# Patient Record
Sex: Male | Born: 1940 | Race: White | Hispanic: No | Marital: Married | Smoking: Former smoker
Health system: Southern US, Community
[De-identification: ages and names within clinical notes are randomized; demographics above are authoritative.]

## PROBLEM LIST (undated history)

## (undated) DIAGNOSIS — E785 Hyperlipidemia, unspecified: Secondary | ICD-10-CM

## (undated) DIAGNOSIS — I4821 Permanent atrial fibrillation: Secondary | ICD-10-CM

## (undated) DIAGNOSIS — I10 Essential (primary) hypertension: Secondary | ICD-10-CM

## (undated) DIAGNOSIS — G250 Essential tremor: Secondary | ICD-10-CM

## (undated) DIAGNOSIS — E119 Type 2 diabetes mellitus without complications: Secondary | ICD-10-CM

## (undated) DIAGNOSIS — L97519 Non-pressure chronic ulcer of other part of right foot with unspecified severity: Secondary | ICD-10-CM

## (undated) DIAGNOSIS — N183 Chronic kidney disease, stage 3 unspecified: Secondary | ICD-10-CM

## (undated) DIAGNOSIS — M109 Gout, unspecified: Secondary | ICD-10-CM

## (undated) DIAGNOSIS — I2781 Cor pulmonale (chronic): Secondary | ICD-10-CM

## (undated) DIAGNOSIS — R269 Unspecified abnormalities of gait and mobility: Secondary | ICD-10-CM

## (undated) DIAGNOSIS — H269 Unspecified cataract: Secondary | ICD-10-CM

## (undated) DIAGNOSIS — R188 Other ascites: Secondary | ICD-10-CM

## (undated) DIAGNOSIS — K7460 Unspecified cirrhosis of liver: Secondary | ICD-10-CM

## (undated) HISTORY — DX: Non-pressure chronic ulcer of other part of right foot with unspecified severity: L97.519

## (undated) HISTORY — DX: Unspecified abnormalities of gait and mobility: R26.9

## (undated) HISTORY — DX: Unspecified cataract: H26.9

## (undated) HISTORY — DX: Hyperlipidemia, unspecified: E78.5

---

## 2002-04-08 ENCOUNTER — Encounter: Payer: Self-pay | Admitting: Urology

## 2002-04-08 ENCOUNTER — Ambulatory Visit (HOSPITAL_COMMUNITY): Admission: RE | Admit: 2002-04-08 | Discharge: 2002-04-08 | Payer: Self-pay | Admitting: Urology

## 2002-12-22 ENCOUNTER — Encounter: Payer: Self-pay | Admitting: Family Medicine

## 2002-12-22 ENCOUNTER — Encounter: Admission: RE | Admit: 2002-12-22 | Discharge: 2002-12-22 | Payer: Self-pay | Admitting: Family Medicine

## 2007-08-11 ENCOUNTER — Ambulatory Visit: Payer: Self-pay | Admitting: Internal Medicine

## 2007-08-24 LAB — CBC WITH DIFFERENTIAL/PLATELET
BASO%: 0.3 % (ref 0.0–2.0)
Basophils Absolute: 0 10*3/uL (ref 0.0–0.1)
EOS%: 0.8 % (ref 0.0–7.0)
Eosinophils Absolute: 0.1 10*3/uL (ref 0.0–0.5)
HCT: 39.6 % (ref 38.7–49.9)
HGB: 13.8 g/dL (ref 13.0–17.1)
LYMPH%: 13.1 % — ABNORMAL LOW (ref 14.0–48.0)
MCH: 33.7 pg — ABNORMAL HIGH (ref 28.0–33.4)
MCHC: 34.8 g/dL (ref 32.0–35.9)
MCV: 96.7 fL (ref 81.6–98.0)
MONO#: 0.9 10*3/uL (ref 0.1–0.9)
MONO%: 6.2 % (ref 0.0–13.0)
NEUT#: 11.8 10*3/uL — ABNORMAL HIGH (ref 1.5–6.5)
NEUT%: 79.6 % — ABNORMAL HIGH (ref 40.0–75.0)
Platelets: 305 10*3/uL (ref 145–400)
RBC: 4.1 10*6/uL — ABNORMAL LOW (ref 4.20–5.71)
RDW: 16.4 % — ABNORMAL HIGH (ref 11.2–14.6)
WBC: 14.8 10*3/uL — ABNORMAL HIGH (ref 4.0–10.0)
lymph#: 1.9 10*3/uL (ref 0.9–3.3)

## 2007-08-25 LAB — COMPREHENSIVE METABOLIC PANEL
Albumin: 4.2 g/dL (ref 3.5–5.2)
Alkaline Phosphatase: 110 U/L (ref 39–117)
BUN: 31 mg/dL — ABNORMAL HIGH (ref 6–23)
CO2: 24 mEq/L (ref 19–32)
Glucose, Bld: 139 mg/dL — ABNORMAL HIGH (ref 70–99)
Total Bilirubin: 1.2 mg/dL (ref 0.3–1.2)
Total Protein: 8.1 g/dL (ref 6.0–8.3)

## 2007-08-25 LAB — LEUKOCYTE ALKALINE PHOS: Leukocyte Alkaline  Phos Stain: 160 — ABNORMAL HIGH (ref 22–124)

## 2007-08-25 LAB — LACTATE DEHYDROGENASE: LDH: 224 U/L (ref 94–250)

## 2007-09-02 LAB — BCR/ABL

## 2007-09-03 LAB — CBC WITH DIFFERENTIAL/PLATELET
BASO%: 0.3 % (ref 0.0–2.0)
EOS%: 0.8 % (ref 0.0–7.0)
HCT: 38.2 % — ABNORMAL LOW (ref 38.7–49.9)
HGB: 13.3 g/dL (ref 13.0–17.1)
MCH: 33.7 pg — ABNORMAL HIGH (ref 28.0–33.4)
MCHC: 34.9 g/dL (ref 32.0–35.9)
MONO#: 0.9 10*3/uL (ref 0.1–0.9)
NEUT%: 76.8 % — ABNORMAL HIGH (ref 40.0–75.0)
RDW: 16.8 % — ABNORMAL HIGH (ref 11.2–14.6)
WBC: 14.3 10*3/uL — ABNORMAL HIGH (ref 4.0–10.0)
lymph#: 2.2 10*3/uL (ref 0.9–3.3)

## 2007-12-31 ENCOUNTER — Ambulatory Visit: Payer: Self-pay | Admitting: Internal Medicine

## 2008-01-04 LAB — CBC WITH DIFFERENTIAL/PLATELET
BASO%: 0.6 % (ref 0.0–2.0)
Basophils Absolute: 0.1 10*3/uL (ref 0.0–0.1)
Eosinophils Absolute: 0.2 10*3/uL (ref 0.0–0.5)
HCT: 39.1 % (ref 38.7–49.9)
LYMPH%: 18.3 % (ref 14.0–48.0)
MCHC: 34.9 g/dL (ref 32.0–35.9)
MONO#: 1.1 10*3/uL — ABNORMAL HIGH (ref 0.1–0.9)
NEUT%: 72.5 % (ref 40.0–75.0)
Platelets: 301 10*3/uL (ref 145–400)
WBC: 15.1 10*3/uL — ABNORMAL HIGH (ref 4.0–10.0)

## 2008-06-29 ENCOUNTER — Ambulatory Visit: Payer: Self-pay | Admitting: Internal Medicine

## 2008-07-04 LAB — CBC WITH DIFFERENTIAL/PLATELET
BASO%: 0.3 % (ref 0.0–2.0)
Basophils Absolute: 0 10*3/uL (ref 0.0–0.1)
EOS%: 1.5 % (ref 0.0–7.0)
Eosinophils Absolute: 0.2 10*3/uL (ref 0.0–0.5)
HCT: 40 % (ref 38.7–49.9)
HGB: 13.7 g/dL (ref 13.0–17.1)
LYMPH%: 17.8 % (ref 14.0–48.0)
MCH: 32.9 pg (ref 28.0–33.4)
MCHC: 34.3 g/dL (ref 32.0–35.9)
MCV: 95.9 fL (ref 81.6–98.0)
MONO#: 0.9 10*3/uL (ref 0.1–0.9)
MONO%: 6.6 % (ref 0.0–13.0)
NEUT#: 9.7 10*3/uL — ABNORMAL HIGH (ref 1.5–6.5)
NEUT%: 73.8 % (ref 40.0–75.0)
Platelets: 287 10*3/uL (ref 145–400)
RBC: 4.17 10*6/uL — ABNORMAL LOW (ref 4.20–5.71)
RDW: 15.8 % — ABNORMAL HIGH (ref 11.2–14.6)
WBC: 13.1 10*3/uL — ABNORMAL HIGH (ref 4.0–10.0)
lymph#: 2.3 10*3/uL (ref 0.9–3.3)

## 2008-07-04 LAB — LACTATE DEHYDROGENASE: LDH: 195 U/L (ref 94–250)

## 2012-07-27 DIAGNOSIS — M6281 Muscle weakness (generalized): Secondary | ICD-10-CM

## 2012-11-24 ENCOUNTER — Ambulatory Visit: Payer: Medicare Other | Attending: Neurology | Admitting: Physical Therapy

## 2012-11-24 DIAGNOSIS — IMO0001 Reserved for inherently not codable concepts without codable children: Secondary | ICD-10-CM | POA: Insufficient documentation

## 2012-11-24 DIAGNOSIS — R5381 Other malaise: Secondary | ICD-10-CM | POA: Insufficient documentation

## 2012-11-24 DIAGNOSIS — R42 Dizziness and giddiness: Secondary | ICD-10-CM | POA: Insufficient documentation

## 2012-11-25 ENCOUNTER — Ambulatory Visit: Payer: Medicare Other | Admitting: *Deleted

## 2012-11-30 ENCOUNTER — Ambulatory Visit: Payer: Medicare Other | Admitting: *Deleted

## 2012-12-04 ENCOUNTER — Ambulatory Visit: Payer: Medicare Other | Admitting: *Deleted

## 2012-12-07 ENCOUNTER — Ambulatory Visit: Payer: Medicare Other | Admitting: *Deleted

## 2012-12-11 ENCOUNTER — Ambulatory Visit: Payer: Medicare Other | Attending: Neurology | Admitting: *Deleted

## 2012-12-11 DIAGNOSIS — R42 Dizziness and giddiness: Secondary | ICD-10-CM | POA: Insufficient documentation

## 2012-12-11 DIAGNOSIS — IMO0001 Reserved for inherently not codable concepts without codable children: Secondary | ICD-10-CM | POA: Insufficient documentation

## 2012-12-11 DIAGNOSIS — R5381 Other malaise: Secondary | ICD-10-CM | POA: Insufficient documentation

## 2012-12-14 ENCOUNTER — Ambulatory Visit: Payer: Medicare Other | Admitting: Physical Therapy

## 2012-12-14 ENCOUNTER — Encounter: Payer: Medicare Other | Admitting: *Deleted

## 2012-12-16 ENCOUNTER — Ambulatory Visit: Payer: Medicare Other | Admitting: *Deleted

## 2012-12-18 ENCOUNTER — Ambulatory Visit: Payer: Medicare Other | Admitting: *Deleted

## 2012-12-21 ENCOUNTER — Ambulatory Visit: Payer: Medicare Other | Admitting: *Deleted

## 2012-12-23 ENCOUNTER — Ambulatory Visit: Payer: Medicare Other | Admitting: *Deleted

## 2012-12-25 ENCOUNTER — Encounter: Payer: Medicare Other | Admitting: *Deleted

## 2012-12-28 ENCOUNTER — Ambulatory Visit: Payer: Medicare Other | Admitting: *Deleted

## 2013-03-01 ENCOUNTER — Other Ambulatory Visit (INDEPENDENT_AMBULATORY_CARE_PROVIDER_SITE_OTHER): Payer: Medicare Other

## 2013-03-01 DIAGNOSIS — N390 Urinary tract infection, site not specified: Secondary | ICD-10-CM

## 2013-03-01 DIAGNOSIS — N183 Chronic kidney disease, stage 3 unspecified: Secondary | ICD-10-CM

## 2013-03-01 NOTE — Progress Notes (Signed)
Pt came in for send out labs only

## 2013-03-02 ENCOUNTER — Encounter: Payer: Self-pay | Admitting: *Deleted

## 2013-03-02 LAB — URINALYSIS, MICROSCOPIC ONLY
Casts: NONE SEEN
Squamous Epithelial / LPF: NONE SEEN

## 2013-03-02 LAB — PROTEIN / CREATININE RATIO, URINE: Protein Creatinine Ratio: 0.07 (ref <0.15)

## 2013-03-09 DIAGNOSIS — L97519 Non-pressure chronic ulcer of other part of right foot with unspecified severity: Secondary | ICD-10-CM

## 2013-03-09 HISTORY — DX: Non-pressure chronic ulcer of other part of right foot with unspecified severity: L97.519

## 2013-03-18 ENCOUNTER — Encounter: Payer: Self-pay | Admitting: Nurse Practitioner

## 2013-03-18 ENCOUNTER — Ambulatory Visit (INDEPENDENT_AMBULATORY_CARE_PROVIDER_SITE_OTHER): Payer: Medicare Other | Admitting: Nurse Practitioner

## 2013-03-18 VITALS — BP 117/68 | HR 94 | Ht 74.0 in | Wt 308.0 lb

## 2013-03-18 DIAGNOSIS — E1121 Type 2 diabetes mellitus with diabetic nephropathy: Secondary | ICD-10-CM | POA: Insufficient documentation

## 2013-03-18 DIAGNOSIS — G250 Essential tremor: Secondary | ICD-10-CM

## 2013-03-18 DIAGNOSIS — E119 Type 2 diabetes mellitus without complications: Secondary | ICD-10-CM

## 2013-03-18 DIAGNOSIS — I10 Essential (primary) hypertension: Secondary | ICD-10-CM

## 2013-03-18 DIAGNOSIS — R269 Unspecified abnormalities of gait and mobility: Secondary | ICD-10-CM

## 2013-03-18 NOTE — Patient Instructions (Addendum)
Continue exercise program 4 times weekly Continue to keep blood sugars in good control Continue with single point cane No signs of Parkinsons disease  F/U in 6 months

## 2013-03-18 NOTE — Progress Notes (Signed)
HPI: Patient returns for followup after her last visit 11/17/2012. He is a history of bilateral hand tremor also past history of diabetes insulin-dependent, hypertension, hyperlipidemia, atrial fibrillation  on chronic Coumadin treatment. He has had bilateral hand tremors for 6-7 years noticeable when he is using a utensil left worse than right. He is right-handed,  minimal gait difficulty denies falls and is using a single-point cane. He denies incontinence, loss of smell, occasionally has vivid dreaming. There is no family history of tremor or Parkinson's disease. He is going to an exercise program 4 times a week, and doing home exercise program other days. He claims this has been very beneficial.  ROS:   Tremor, impotence,  RLS  Physical Exam General: well developed, well nourished, seated, in no evident distress Head: head normocephalic and atraumatic. Oropharynx benign Neck: supple with no carotid or supraclavicular bruits Cardiovascular: regular rate and rhythm, no murmurs  Neurologic Exam Mental Status: Awake and fully alert. Oriented to place and time. Recent and remote memory intact. Attention span, concentration and fund of knowledge appropriate. Mood and affect appropriate.  Cranial Nerves:Pupils equal, briskly reactive to light. Extraocular movements full without nystagmus. Visual fields full to confrontation. Hearing intact and symmetric to finger snap. Facial sensation intact. Face, tongue, palate move normally and symmetrically. Neck flexion and extension normal.  Motor: Normal bulk and tone. Normal strength in all tested extremity muscles. Bilateral hand resting and postural tremor, no rigidity, left greater than right Sensory.:  Coordination: Rapid alternating movements normal in all extremities. Finger-to-nose and heel-to-shin performed accurately bilaterally. Gait and Station: Arises from chair without difficulty. Stance is wide based. Able to heel, toe and mild difficulty with   tandem walk. Using single-point cane  Reflexes: 1+ and symmetric except absent patellar and achilles. Toes downgoing.     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 History of diabetes, hypertension, hyperlipidemia.   PLAN: Continue exercise program 4 times weekly Continue to keep blood sugars in good control Continue with single point cane No signs of Parkinsons disease Patient is not interested in medication at this time for his tremor as he feels the exercise program has helped tremendously.  F/U in 6 months   Nilda Riggs, [facility] APRN

## 2013-03-23 ENCOUNTER — Ambulatory Visit (INDEPENDENT_AMBULATORY_CARE_PROVIDER_SITE_OTHER): Payer: Medicare Other | Admitting: Pharmacist Clinician (PhC)/ Clinical Pharmacy Specialist

## 2013-03-23 DIAGNOSIS — I4821 Permanent atrial fibrillation: Secondary | ICD-10-CM | POA: Insufficient documentation

## 2013-03-23 DIAGNOSIS — I4891 Unspecified atrial fibrillation: Secondary | ICD-10-CM

## 2013-04-05 ENCOUNTER — Encounter (HOSPITAL_COMMUNITY): Payer: Self-pay | Admitting: Pharmacy Technician

## 2013-04-12 NOTE — Patient Instructions (Addendum)
Your procedure is scheduled on:  04/19/2013  Report to [facility] at   12:30   AM.  Call this number if you have problems the morning of surgery: [phone]   Remember:   Do not eat or drink :After Midnight.    Take these medicines the morning of surgery with A SIP OF WATER: Atenolol and lisinopril   Do not wear jewelry, make-up or nail polish.  Do not wear lotions, powders, or perfumes. You may wear deodorant.  Do not shave 48 hours prior to surgery.  Do not bring valuables to the hospital.  Contacts, dentures or bridgework may not be worn into surgery.  Patients discharged the day of surgery will not be allowed to drive home.  Name and phone number of your driver:    Please read over the following fact sheets that you were given: Pain Booklet, Surgical Site Infection Prevention, Anesthesia Post-op Instructions and Care and Recovery After Surgery  Cataract Surgery  A cataract is a clouding of the lens of the eye. When a lens becomes cloudy, vision is reduced based on the degree and nature of the clouding. Surgery may be needed to improve vision. Surgery removes the cloudy lens and usually replaces it with a substitute lens (intraocular lens, IOL). LET YOUR EYE DOCTOR KNOW ABOUT:  Allergies to food or medicine.   Medicines taken including herbs, eyedrops, over-the-counter medicines, and creams.   Use of steroids (by mouth or creams).   Previous problems with anesthetics or numbing medicine.   History of bleeding problems or blood clots.   Previous surgery.   Other health problems, including diabetes and kidney problems.   Possibility of pregnancy, if this applies.  RISKS AND COMPLICATIONS  Infection.   Inflammation of the eyeball (endophthalmitis) that can spread to both eyes (sympathetic ophthalmia).   Poor wound healing.   If an IOL is inserted, it can later fall out of proper position. This is very uncommon.   Clouding of the part of your eye that holds an IOL in  place. This is called an "after-cataract." These are uncommon, but easily treated.  BEFORE THE PROCEDURE  Do not eat or drink anything except small amounts of water for 8 to 12 before your surgery, or as directed by your caregiver.   Unless you are told otherwise, continue any eyedrops you have been prescribed.   Talk to your primary caregiver about all other medicines that you take (both prescription and non-prescription). In some cases, you may need to stop or change medicines near the time of your surgery. This is most important if you are taking blood-thinning medicine.Do not stop medicines unless you are told to do so.   Arrange for someone to drive you to and from the procedure.   Do not put contact lenses in either eye on the day of your surgery.  PROCEDURE There is more than one method for safely removing a cataract. Your doctor can explain the differences and help determine which is best for you. Phacoemulsification surgery is the most common form of cataract surgery.  An injection is given behind the eye or eyedrops are given to make this a painless procedure.   A small cut (incision) is made on the edge of the clear, dome-shaped surface that covers the front of the eye (cornea).   A tiny probe is painlessly inserted into the eye. This device gives off ultrasound waves that soften and break up the cloudy center of the lens. This makes it  easier for the cloudy lens to be removed by suction.   An IOL may be implanted.   The normal lens of the eye is covered by a clear capsule. Part of that capsule is intentionally left in the eye to support the IOL.   Your surgeon may or may not use stitches to close the incision.  There are other forms of cataract surgery that require a larger incision and stiches to close the eye. This approach is taken in cases where the doctor feels that the cataract cannot be easily removed using phacoemulsification. AFTER THE PROCEDURE  When an IOL is  implanted, it does not need care. It becomes a permanent part of your eye and cannot be seen or felt.   Your doctor will schedule follow-up exams to check on your progress.   Review your other medicines with your doctor to see which can be resumed after surgery.   Use eyedrops or take medicine as prescribed by your doctor.  Document Released: 11/14/2011 Document Reviewed: 11/11/2011 [facility] Patient Information 2012 [place].  .Cataract Surgery Care After Refer to this sheet in the next few weeks. These instructions provide you with information on caring for yourself after your procedure. Your caregiver may also give you more specific instructions. Your treatment has been planned according to current medical practices, but problems sometimes occur. Call your caregiver if you have any problems or questions after your procedure.  HOME CARE INSTRUCTIONS   Avoid strenuous activities as directed by your caregiver.   Ask your caregiver when you can resume driving.   Use eyedrops or other medicines to help healing and control pressure inside your eye as directed by your caregiver.   Only take over-the-counter or prescription medicines for pain, discomfort, or fever as directed by your caregiver.   Do not to touch or rub your eyes.   You may be instructed to use a protective shield during the first few days and nights after surgery. If not, wear sunglasses to protect your eyes. This is to protect the eye from pressure or from being accidentally bumped.   Keep the area around your eye clean and dry. Avoid swimming or allowing water to hit you directly in the face while showering. Keep soap and shampoo out of your eyes.   Do not bend or lift heavy objects. Bending increases pressure in the eye. You can walk, climb stairs, and do light household chores.   Do not put a contact lens into the eye that had surgery until your caregiver says it is okay to do so.   Ask your doctor when you can  return to work. This will depend on the kind of work that you do. If you work in a dusty environment, you may be advised to wear protective eyewear for a period of time.   Ask your caregiver when it will be safe to engage in sexual activity.   Continue with your regular eye exams as directed by your caregiver.  What to expect:  It is normal to feel itching and mild discomfort for a few days after cataract surgery. Some fluid discharge is also common, and your eye may be sensitive to light and touch.   After 1 to 2 days, even moderate discomfort should disappear. In most cases, healing will take about 6 weeks.   If you received an intraocular lens (IOL), you may notice that colors are very bright or have a blue tinge. Also, if you have been in bright sunlight, everything  may appear reddish for a few hours. If you see these color tinges, it is because your lens is clear and no longer cloudy. Within a few months after receiving an IOL, these extra colors should go away. When you have healed, you will probably need new glasses.  SEEK MEDICAL CARE IF:   You have increased bruising around your eye.   You have discomfort not helped by medicine.  SEEK IMMEDIATE MEDICAL CARE IF:   You have a fever.   You have a worsening or sudden vision loss.   You have redness, swelling, or increasing pain in the eye.   You have a thick discharge from the eye that had surgery.  MAKE SURE YOU:  Understand these instructions.   Will watch your condition.   Will get help right away if you are not doing well or get worse.  Document Released: 06/14/2005 Document Revised: 11/14/2011 Document Reviewed: 07/19/2011 [facility] Patient Information 2012 [place], [place]. PATIENT INSTRUCTIONS POST-ANESTHESIA  IMMEDIATELY FOLLOWING SURGERY:  Do not drive or operate machinery for the first twenty four hours after surgery.  Do not make any important decisions for twenty four hours after surgery or while taking narcotic  pain medications or sedatives.  If you develop intractable nausea and vomiting or a severe headache please notify your doctor immediately.  FOLLOW-UP:  Please make an appointment with your surgeon as instructed. You do not need to follow up with anesthesia unless specifically instructed to do so.  WOUND CARE INSTRUCTIONS (if applicable):  Keep a dry clean dressing on the anesthesia/puncture wound site if there is drainage.  Once the wound has quit draining you may leave it open to air.  Generally you should leave the bandage intact for twenty four hours unless there is drainage.  If the epidural site drains for more than 36-48 hours please call the anesthesia department.  QUESTIONS?:  Please feel free to call your physician or the hospital operator if you have any questions, and they will be happy to assist you.

## 2013-04-13 ENCOUNTER — Other Ambulatory Visit: Payer: Self-pay

## 2013-04-13 ENCOUNTER — Encounter (HOSPITAL_COMMUNITY)
Admission: RE | Admit: 2013-04-13 | Discharge: 2013-04-13 | Disposition: A | Discharge status: Home / Self Care | Payer: Medicare Other | Source: Ambulatory Visit | Attending: Ophthalmology | Admitting: Ophthalmology

## 2013-04-13 ENCOUNTER — Encounter (HOSPITAL_COMMUNITY): Payer: Self-pay

## 2013-04-13 HISTORY — DX: Gout, unspecified: M10.9

## 2013-04-13 LAB — BASIC METABOLIC PANEL
CO2: 28 mEq/L (ref 19–32)
Chloride: 98 mEq/L (ref 96–112)
Glucose, Bld: 124 mg/dL — ABNORMAL HIGH (ref 70–99)
Sodium: 136 mEq/L (ref 135–145)

## 2013-04-13 LAB — HEMOGLOBIN AND HEMATOCRIT, BLOOD
HCT: 37.3 % — ABNORMAL LOW (ref 39.0–52.0)
Hemoglobin: 13 g/dL (ref 13.0–17.0)

## 2013-04-16 MED ORDER — CYCLOPENTOLATE-PHENYLEPHRINE 0.2-1 % OP SOLN
OPHTHALMIC | Status: AC
  Filled 2013-04-16: qty 2

## 2013-04-16 MED ORDER — TETRACAINE HCL 0.5 % OP SOLN
OPHTHALMIC | Status: AC
  Filled 2013-04-16: qty 2

## 2013-04-16 MED ORDER — LIDOCAINE HCL 3.5 % OP GEL
OPHTHALMIC | Status: AC
  Filled 2013-04-16: qty 5

## 2013-04-16 MED ORDER — LIDOCAINE HCL (PF) 1 % IJ SOLN
INTRAMUSCULAR | Status: AC
  Filled 2013-04-16: qty 2

## 2013-04-16 MED ORDER — PHENYLEPHRINE HCL 2.5 % OP SOLN
OPHTHALMIC | Status: AC
  Filled 2013-04-16: qty 2

## 2013-04-19 ENCOUNTER — Ambulatory Visit (HOSPITAL_COMMUNITY)
Admission: RE | Admit: 2013-04-19 | Discharge: 2013-04-19 | Disposition: A | Discharge status: Home / Self Care | Payer: Medicare Other | Source: Ambulatory Visit | Attending: Ophthalmology | Admitting: Ophthalmology

## 2013-04-19 ENCOUNTER — Encounter (HOSPITAL_COMMUNITY): Payer: Self-pay | Admitting: *Deleted

## 2013-04-19 ENCOUNTER — Encounter (HOSPITAL_COMMUNITY)
Admission: RE | Disposition: A | Discharge status: Home / Self Care | Payer: Self-pay | Source: Ambulatory Visit | Attending: Ophthalmology

## 2013-04-19 ENCOUNTER — Ambulatory Visit (HOSPITAL_COMMUNITY): Payer: Medicare Other | Admitting: Anesthesiology

## 2013-04-19 ENCOUNTER — Encounter (HOSPITAL_COMMUNITY): Payer: Self-pay | Admitting: Anesthesiology

## 2013-04-19 DIAGNOSIS — E119 Type 2 diabetes mellitus without complications: Secondary | ICD-10-CM | POA: Insufficient documentation

## 2013-04-19 DIAGNOSIS — H2589 Other age-related cataract: Secondary | ICD-10-CM | POA: Insufficient documentation

## 2013-04-19 DIAGNOSIS — I10 Essential (primary) hypertension: Secondary | ICD-10-CM | POA: Insufficient documentation

## 2013-04-19 DIAGNOSIS — Z01812 Encounter for preprocedural laboratory examination: Secondary | ICD-10-CM | POA: Insufficient documentation

## 2013-04-19 HISTORY — PX: CATARACT EXTRACTION W/PHACO: SHX586

## 2013-04-19 SURGERY — PHACOEMULSIFICATION, CATARACT, WITH IOL INSERTION
Anesthesia: Monitor Anesthesia Care | Site: Eye | Laterality: Left | Wound class: Clean

## 2013-04-19 MED ORDER — LACTATED RINGERS IV SOLN
INTRAVENOUS | Status: DC
  Administered 2013-04-19: 13:00:00 via INTRAVENOUS

## 2013-04-19 MED ORDER — LIDOCAINE 3.5 % OP GEL OPTIME - NO CHARGE
OPHTHALMIC | Status: DC | PRN
  Administered 2013-04-19: 2 [drp] via OPHTHALMIC

## 2013-04-19 MED ORDER — CYCLOPENTOLATE-PHENYLEPHRINE 0.2-1 % OP SOLN
1.0000 [drp] | OPHTHALMIC | Status: AC
  Administered 2013-04-19 (×3): 1 [drp] via OPHTHALMIC

## 2013-04-19 MED ORDER — EPINEPHRINE HCL 1 MG/ML IJ SOLN
INTRAOCULAR | Status: DC | PRN
  Administered 2013-04-19: 14:00:00

## 2013-04-19 MED ORDER — MIDAZOLAM HCL 2 MG/2ML IJ SOLN
1.0000 mg | INTRAMUSCULAR | Status: DC | PRN
  Administered 2013-04-19: 2 mg via INTRAVENOUS

## 2013-04-19 MED ORDER — FENTANYL CITRATE 0.05 MG/ML IJ SOLN: 25.0000 ug | INTRAMUSCULAR | Status: DC | PRN

## 2013-04-19 MED ORDER — NEOMYCIN-POLYMYXIN-DEXAMETH 0.1 % OP OINT
TOPICAL_OINTMENT | OPHTHALMIC | Status: DC | PRN
  Administered 2013-04-19: 1 via OPHTHALMIC

## 2013-04-19 MED ORDER — EPINEPHRINE HCL 1 MG/ML IJ SOLN
INTRAMUSCULAR | Status: AC
  Filled 2013-04-19: qty 1

## 2013-04-19 MED ORDER — BSS IO SOLN
INTRAOCULAR | Status: DC | PRN
  Administered 2013-04-19: 15 mL via INTRAOCULAR

## 2013-04-19 MED ORDER — TETRACAINE HCL 0.5 % OP SOLN
1.0000 [drp] | OPHTHALMIC | Status: AC
  Administered 2013-04-19 (×3): 1 [drp] via OPHTHALMIC

## 2013-04-19 MED ORDER — LIDOCAINE HCL 3.5 % OP GEL
1.0000 "application " | Freq: Once | OPHTHALMIC | Status: AC
  Administered 2013-04-19: 1 via OPHTHALMIC

## 2013-04-19 MED ORDER — LIDOCAINE HCL (PF) 1 % IJ SOLN
INTRAMUSCULAR | Status: DC | PRN
  Administered 2013-04-19: .5 mL

## 2013-04-19 MED ORDER — MIDAZOLAM HCL 2 MG/2ML IJ SOLN
INTRAMUSCULAR | Status: AC
  Filled 2013-04-19: qty 2

## 2013-04-19 MED ORDER — ONDANSETRON HCL 4 MG/2ML IJ SOLN: 4.0000 mg | Freq: Once | INTRAMUSCULAR | Status: DC | PRN

## 2013-04-19 MED ORDER — PHENYLEPHRINE HCL 2.5 % OP SOLN
1.0000 [drp] | OPHTHALMIC | Status: AC
  Administered 2013-04-19 (×3): 1 [drp] via OPHTHALMIC

## 2013-04-19 MED ORDER — POVIDONE-IODINE 5 % OP SOLN
OPHTHALMIC | Status: DC | PRN
  Administered 2013-04-19: 1 via OPHTHALMIC

## 2013-04-19 SURGICAL SUPPLY — 32 items

## 2013-04-19 NOTE — Anesthesia Postprocedure Evaluation (Signed)
  Anesthesia Post-op Note  Patient: Melvin Wang  Procedure(s) Performed: Procedure(s) (LRB): CATARACT EXTRACTION PHACO AND INTRAOCULAR LENS PLACEMENT (IOC) (Left)  Patient Location:  Short Stay  Anesthesia Type: MAC  Level of Consciousness: awake  Airway and Oxygen Therapy: Patient Spontanous Breathing  Post-op Pain: none  Post-op Assessment: Post-op Vital signs reviewed, Patient's Cardiovascular Status Stable, Respiratory Function Stable, Patent Airway, No signs of Nausea or vomiting and Pain level controlled  Post-op Vital Signs: Reviewed and stable  Complications: No apparent anesthesia complications

## 2013-04-19 NOTE — Transfer of Care (Signed)
Immediate Anesthesia Transfer of Care Note  Patient: Melvin Wang  Procedure(s) Performed: Procedure(s) (LRB): CATARACT EXTRACTION PHACO AND INTRAOCULAR LENS PLACEMENT (IOC) (Left)  Patient Location: Shortstay  Anesthesia Type: MAC  Level of Consciousness: awake  Airway & Oxygen Therapy: Patient Spontanous Breathing   Post-op Assessment: Report given to PACU RN, Post -op Vital signs reviewed and stable and Patient moving all extremities  Post vital signs: Reviewed and stable  Complications: No apparent anesthesia complications

## 2013-04-19 NOTE — Anesthesia Procedure Notes (Signed)
Procedure Name: MAC Date/Time: 04/19/2013 1:50 PM Performed by: Franco Nones Pre-anesthesia Checklist: Patient identified, Emergency Drugs available, Suction available, Timeout performed and Patient being monitored Patient Re-evaluated:Patient Re-evaluated prior to inductionOxygen Delivery Method: Nasal Cannula

## 2013-04-19 NOTE — H&P (Signed)
I have reviewed the H&P, the patient was re-examined, and I have identified no interval changes in medical condition and plan of care since the history and physical of record  

## 2013-04-19 NOTE — Op Note (Signed)
Date of Admission: 04/19/13  Date of Surgery: 04/19/13  Pre-Op Dx: Cataract  Left  Eye  Post-Op Dx: Cataract   Left  Eye,  Dx Code 366.19  Surgeon: Gemma Payor, M.D.  Assistants: None  Anesthesia: Topical with MAC  Indications: Painless, progressive loss of vision with compromise of daily activities.  Surgery: Cataract Extraction with Intraocular lens Implant Left Eye  Discription: The patient had dilating drops and viscous lidocaine placed into the left eye in the pre-op holding area. After transfer to the operating room, a time out was performed. The patient was then prepped and draped. Beginning with a 75 degree blade a paracentesis port was made at the surgeon's 2 o'clock position. The anterior chamber was then filled with 1% non-preserved lidocaine. This was followed by filling the anterior chamber with Provisc. A bent cystatome needle was used to create a continuous tear capsulotomy. Hydrodissection was performed with balanced salt solution on a Fine canula. The lens nucleus was then removed using the phacoemulsification handpiece. Residual cortex was removed with the I&A handpiece. The anterior chamber and capsular bag were refilled with Provisc. A posterior chamber intraocular lens was placed into the capsular bag with it's injector. The implant was positioned with the Kuglan hook. The Provisc was then removed from the anterior chamber and capsular bag with the I&A handpiece. Stromal hydration of the main incision and paracentesis port was performed with BSS on a Fine canula. The wounds were tested for leak which was negative. The patient tolerated the procedure well. There were no operative complications. The patient was then transferred to the recovery room in stable condition.  Prosthetic device: B&L enVista, MX60, power 21.0D.  Specimen: None  EBL: None  Complications: None

## 2013-04-19 NOTE — Anesthesia Preprocedure Evaluation (Signed)
Anesthesia Evaluation  Patient identified by MRN, date of birth, ID band Patient awake    Reviewed: Allergy & Precautions, H&P , NPO status , Patient's Chart, lab work & pertinent test results  Airway Mallampati: II TM Distance: >3 FB     Dental  (+) Teeth Intact and Poor Dentition   Pulmonary former smoker,  breath sounds clear to auscultation        Cardiovascular hypertension, Pt. on medications + dysrhythmias Atrial Fibrillation Rhythm:Irregular Rate:Normal     Neuro/Psych Gait abnormality     GI/Hepatic   Endo/Other  diabetes, Type 2, Oral Hypoglycemic Agents  Renal/GU Renal InsufficiencyRenal disease     Musculoskeletal   Abdominal   Peds  Hematology   Anesthesia Other Findings   Reproductive/Obstetrics                           Anesthesia Physical Anesthesia Plan  ASA: III  Anesthesia Plan: MAC   Post-op Pain Management:    Induction: Intravenous  Airway Management Planned: Nasal Cannula  Additional Equipment:   Intra-op Plan:   Post-operative Plan:   Informed Consent: I have reviewed the patients History and Physical, chart, labs and discussed the procedure including the risks, benefits and alternatives for the proposed anesthesia with the patient or authorized representative who has indicated his/her understanding and acceptance.     Plan Discussed with:   Anesthesia Plan Comments:         Anesthesia Quick Evaluation

## 2013-04-21 ENCOUNTER — Encounter (HOSPITAL_COMMUNITY): Payer: Self-pay | Admitting: Ophthalmology

## 2013-05-11 ENCOUNTER — Ambulatory Visit (INDEPENDENT_AMBULATORY_CARE_PROVIDER_SITE_OTHER): Payer: Medicare Other | Admitting: Pharmacist Clinician (PhC)/ Clinical Pharmacy Specialist

## 2013-05-11 DIAGNOSIS — I4891 Unspecified atrial fibrillation: Secondary | ICD-10-CM

## 2013-05-11 LAB — POCT INR: INR: 3.3

## 2013-05-26 ENCOUNTER — Other Ambulatory Visit: Payer: Self-pay | Admitting: Nurse Practitioner

## 2013-05-27 ENCOUNTER — Other Ambulatory Visit: Payer: Self-pay | Admitting: Nurse Practitioner

## 2013-06-02 ENCOUNTER — Other Ambulatory Visit: Payer: Self-pay | Admitting: Nurse Practitioner

## 2013-06-14 ENCOUNTER — Other Ambulatory Visit: Payer: Self-pay | Admitting: Nurse Practitioner

## 2013-06-16 NOTE — Telephone Encounter (Signed)
Please send to [facility]MM, unsure if he is taking that amount of lasix. thx

## 2013-06-17 NOTE — Telephone Encounter (Signed)
Rx called to vm.

## 2013-06-22 ENCOUNTER — Ambulatory Visit (INDEPENDENT_AMBULATORY_CARE_PROVIDER_SITE_OTHER): Payer: Medicare Other | Admitting: Pharmacist Clinician (PhC)/ Clinical Pharmacy Specialist

## 2013-06-22 DIAGNOSIS — I4891 Unspecified atrial fibrillation: Secondary | ICD-10-CM

## 2013-06-22 LAB — POCT INR: INR: 3.1

## 2013-07-06 ENCOUNTER — Encounter (HOSPITAL_COMMUNITY): Payer: Self-pay | Admitting: Pharmacy Technician

## 2013-07-07 ENCOUNTER — Encounter (HOSPITAL_COMMUNITY): Payer: Self-pay

## 2013-07-07 ENCOUNTER — Encounter (HOSPITAL_COMMUNITY)
Admission: RE | Admit: 2013-07-07 | Discharge: 2013-07-07 | Disposition: A | Discharge status: Home / Self Care | Payer: Medicare Other | Source: Ambulatory Visit | Attending: Ophthalmology | Admitting: Ophthalmology

## 2013-07-07 NOTE — Patient Instructions (Addendum)
Your procedure is scheduled on: 07/15/2013  Report to [facility] at 1000 AM.  Call this number if you have problems the morning of surgery: [phone]   Do not eat food or drink liquids :After Midnight.      Take these medicines the morning of surgery with A SIP OF WATER: allopurinil, tenormin, lisinopril. Take 1/2 Lantus dosage the night before your surgery.   Do not wear jewelry, make-up or nail polish.  Do not wear lotions, powders, or perfumes.  Do not shave 48 hours prior to surgery.  Do not bring valuables to the hospital.  Contacts, dentures or bridgework may not be worn into surgery.  Leave suitcase in the car. After surgery it may be brought to your room.  For patients admitted to the hospital, checkout time is 11:00 AM the day of discharge.   Patients discharged the day of surgery will not be allowed to drive home.  :     Please read over the following fact sheets that you were given: Coughing and Deep Breathing, Surgical Site Infection Prevention, Anesthesia Post-op Instructions and Care and Recovery After Surgery    Cataract A cataract is a clouding of the lens of the eye. When a lens becomes cloudy, vision is reduced based on the degree and nature of the clouding. Many cataracts reduce vision to some degree. Some cataracts make people more near-sighted as they develop. Other cataracts increase glare. Cataracts that are ignored and become worse can sometimes look white. The white color can be seen through the pupil. CAUSES   Aging. However, cataracts may occur at any age, even in newborns.   Certain drugs.   Trauma to the eye.   Certain diseases such as diabetes.   Specific eye diseases such as chronic inflammation inside the eye or a sudden attack of a rare form of glaucoma.   Inherited or acquired medical problems.  SYMPTOMS   Gradual, progressive drop in vision in the affected eye.   Severe, rapid visual loss. This most often happens when trauma is the cause.    DIAGNOSIS  To detect a cataract, an eye doctor examines the lens. Cataracts are best diagnosed with an exam of the eyes with the pupils enlarged (dilated) by drops.  TREATMENT  For an early cataract, vision may improve by using different eyeglasses or stronger lighting. If that does not help your vision, surgery is the only effective treatment. A cataract needs to be surgically removed when vision loss interferes with your everyday activities, such as driving, reading, or watching TV. A cataract may also have to be removed if it prevents examination or treatment of another eye problem. Surgery removes the cloudy lens and usually replaces it with a substitute lens (intraocular lens, IOL).  At a time when both you and your doctor agree, the cataract will be surgically removed. If you have cataracts in both eyes, only one is usually removed at a time. This allows the operated eye to heal and be out of danger from any possible problems after surgery (such as infection or poor wound healing). In rare cases, a cataract may be doing damage to your eye. In these cases, your caregiver may advise surgical removal right away. The vast majority of people who have cataract surgery have better vision afterward. HOME CARE INSTRUCTIONS  If you are not planning surgery, you may be asked to do the following:  Use different eyeglasses.   Use stronger or brighter lighting.   Ask your eye doctor  about reducing your medicine dose or changing medicines if it is thought that a medicine caused your cataract. Changing medicines does not make the cataract go away on its own.   Become familiar with your surroundings. Poor vision can lead to injury. Avoid bumping into things on the affected side. You are at a higher risk for tripping or falling.   Exercise extreme care when driving or operating machinery.   Wear sunglasses if you are sensitive to bright light or experiencing problems with glare.  SEEK IMMEDIATE MEDICAL CARE  IF:   You have a worsening or sudden vision loss.   You notice redness, swelling, or increasing pain in the eye.   You have a fever.  Document Released: 11/25/2005 Document Revised: 11/14/2011 Document Reviewed: 07/19/2011 [facility] Patient Information 2012 [place].PATIENT INSTRUCTIONS POST-ANESTHESIA  IMMEDIATELY FOLLOWING SURGERY:  Do not drive or operate machinery for the first twenty four hours after surgery.  Do not make any important decisions for twenty four hours after surgery or while taking narcotic pain medications or sedatives.  If you develop intractable nausea and vomiting or a severe headache please notify your doctor immediately.  FOLLOW-UP:  Please make an appointment with your surgeon as instructed. You do not need to follow up with anesthesia unless specifically instructed to do so.  WOUND CARE INSTRUCTIONS (if applicable):  Keep a dry clean dressing on the anesthesia/puncture wound site if there is drainage.  Once the wound has quit draining you may leave it open to air.  Generally you should leave the bandage intact for twenty four hours unless there is drainage.  If the epidural site drains for more than 36-48 hours please call the anesthesia department.  QUESTIONS?:  Please feel free to call your physician or the hospital operator if you have any questions, and they will be happy to assist you.

## 2013-07-14 ENCOUNTER — Other Ambulatory Visit: Payer: Self-pay

## 2013-07-14 MED ORDER — LIDOCAINE HCL 3.5 % OP GEL
OPHTHALMIC | Status: AC
  Filled 2013-07-14: qty 5

## 2013-07-14 MED ORDER — NEOMYCIN-POLYMYXIN-DEXAMETH 3.5-10000-0.1 OP OINT
TOPICAL_OINTMENT | OPHTHALMIC | Status: AC
  Filled 2013-07-14: qty 3.5

## 2013-07-14 MED ORDER — TETRACAINE HCL 0.5 % OP SOLN
OPHTHALMIC | Status: AC
  Filled 2013-07-14: qty 2

## 2013-07-14 MED ORDER — PHENYLEPHRINE HCL 2.5 % OP SOLN
OPHTHALMIC | Status: AC
  Filled 2013-07-14: qty 2

## 2013-07-14 MED ORDER — LIDOCAINE HCL (PF) 1 % IJ SOLN
INTRAMUSCULAR | Status: AC
  Filled 2013-07-14: qty 2

## 2013-07-14 MED ORDER — CYCLOPENTOLATE-PHENYLEPHRINE 0.2-1 % OP SOLN
OPHTHALMIC | Status: AC
  Filled 2013-07-14: qty 2

## 2013-07-15 ENCOUNTER — Encounter (HOSPITAL_COMMUNITY): Payer: Self-pay | Admitting: Anesthesiology

## 2013-07-15 ENCOUNTER — Encounter (HOSPITAL_COMMUNITY)
Admission: RE | Disposition: A | Discharge status: Home / Self Care | Payer: Self-pay | Source: Ambulatory Visit | Attending: Ophthalmology

## 2013-07-15 ENCOUNTER — Ambulatory Visit (HOSPITAL_COMMUNITY): Payer: Medicare Other | Admitting: Anesthesiology

## 2013-07-15 ENCOUNTER — Encounter (HOSPITAL_COMMUNITY): Payer: Self-pay | Admitting: *Deleted

## 2013-07-15 ENCOUNTER — Ambulatory Visit (HOSPITAL_COMMUNITY)
Admission: RE | Admit: 2013-07-15 | Discharge: 2013-07-15 | Disposition: A | Discharge status: Home / Self Care | Payer: Medicare Other | Source: Ambulatory Visit | Attending: Ophthalmology | Admitting: Ophthalmology

## 2013-07-15 DIAGNOSIS — E119 Type 2 diabetes mellitus without complications: Secondary | ICD-10-CM | POA: Insufficient documentation

## 2013-07-15 DIAGNOSIS — H2589 Other age-related cataract: Secondary | ICD-10-CM | POA: Insufficient documentation

## 2013-07-15 DIAGNOSIS — Z01812 Encounter for preprocedural laboratory examination: Secondary | ICD-10-CM | POA: Insufficient documentation

## 2013-07-15 DIAGNOSIS — I10 Essential (primary) hypertension: Secondary | ICD-10-CM | POA: Insufficient documentation

## 2013-07-15 HISTORY — PX: CATARACT EXTRACTION W/PHACO: SHX586

## 2013-07-15 LAB — GLUCOSE, CAPILLARY

## 2013-07-15 SURGERY — PHACOEMULSIFICATION, CATARACT, WITH IOL INSERTION
Anesthesia: Monitor Anesthesia Care | Site: Eye | Laterality: Right | Wound class: Clean

## 2013-07-15 MED ORDER — CYCLOPENTOLATE-PHENYLEPHRINE 0.2-1 % OP SOLN
1.0000 [drp] | OPHTHALMIC | Status: AC
  Administered 2013-07-15 (×3): 1 [drp] via OPHTHALMIC

## 2013-07-15 MED ORDER — LIDOCAINE HCL 3.5 % OP GEL
1.0000 "application " | Freq: Once | OPHTHALMIC | Status: AC
  Administered 2013-07-15: 1 via OPHTHALMIC

## 2013-07-15 MED ORDER — NEOMYCIN-POLYMYXIN-DEXAMETH 0.1 % OP OINT
TOPICAL_OINTMENT | OPHTHALMIC | Status: DC | PRN
  Administered 2013-07-15: 1 via OPHTHALMIC

## 2013-07-15 MED ORDER — BSS IO SOLN
INTRAOCULAR | Status: DC | PRN
  Administered 2013-07-15: 15 mL via INTRAOCULAR

## 2013-07-15 MED ORDER — LACTATED RINGERS IV SOLN
INTRAVENOUS | Status: DC
  Administered 2013-07-15: 11:00:00 via INTRAVENOUS

## 2013-07-15 MED ORDER — EPINEPHRINE HCL 1 MG/ML IJ SOLN
INTRAMUSCULAR | Status: AC
  Filled 2013-07-15: qty 1

## 2013-07-15 MED ORDER — TETRACAINE HCL 0.5 % OP SOLN
1.0000 [drp] | OPHTHALMIC | Status: AC
  Administered 2013-07-15 (×3): 1 [drp] via OPHTHALMIC

## 2013-07-15 MED ORDER — ONDANSETRON HCL 4 MG/2ML IJ SOLN: 4.0000 mg | Freq: Once | INTRAMUSCULAR | Status: DC | PRN

## 2013-07-15 MED ORDER — MIDAZOLAM HCL 2 MG/2ML IJ SOLN
1.0000 mg | INTRAMUSCULAR | Status: DC | PRN
  Administered 2013-07-15: 2 mg via INTRAVENOUS

## 2013-07-15 MED ORDER — LIDOCAINE HCL (PF) 1 % IJ SOLN
INTRAMUSCULAR | Status: DC | PRN
  Administered 2013-07-15: .4 mL

## 2013-07-15 MED ORDER — PHENYLEPHRINE HCL 2.5 % OP SOLN
1.0000 [drp] | OPHTHALMIC | Status: AC
  Administered 2013-07-15 (×3): 1 [drp] via OPHTHALMIC

## 2013-07-15 MED ORDER — PROVISC 10 MG/ML IO SOLN
INTRAOCULAR | Status: DC | PRN
  Administered 2013-07-15: 8.5 mg via INTRAOCULAR

## 2013-07-15 MED ORDER — MIDAZOLAM HCL 2 MG/2ML IJ SOLN
INTRAMUSCULAR | Status: AC
  Filled 2013-07-15: qty 2

## 2013-07-15 MED ORDER — FENTANYL CITRATE 0.05 MG/ML IJ SOLN: 25.0000 ug | INTRAMUSCULAR | Status: DC | PRN

## 2013-07-15 MED ORDER — EPINEPHRINE HCL 1 MG/ML IJ SOLN
INTRAOCULAR | Status: DC | PRN
  Administered 2013-07-15: 11:00:00

## 2013-07-15 SURGICAL SUPPLY — 31 items
CAPSULAR TENSION RING-AMO (OPHTHALMIC RELATED) IMPLANT
CLOTH BEACON ORANGE TIMEOUT ST (SAFETY) ×1 IMPLANT
EYE SHIELD UNIVERSAL CLEAR (GAUZE/BANDAGES/DRESSINGS) ×1 IMPLANT
GLOVE BIO SURGEON STRL SZ 6.5 (GLOVE) IMPLANT
GLOVE BIOGEL PI IND STRL 6.5 (GLOVE) IMPLANT
GLOVE BIOGEL PI IND STRL 7.0 (GLOVE) IMPLANT
GLOVE BIOGEL PI IND STRL 7.5 (GLOVE) IMPLANT
GLOVE BIOGEL PI INDICATOR 6.5 (GLOVE)
GLOVE BIOGEL PI INDICATOR 7.0 (GLOVE) ×1
GLOVE BIOGEL PI INDICATOR 7.5 (GLOVE)
GLOVE ECLIPSE 6.5 STRL STRAW (GLOVE) IMPLANT
GLOVE ECLIPSE 7.0 STRL STRAW (GLOVE) IMPLANT
GLOVE ECLIPSE 7.5 STRL STRAW (GLOVE) IMPLANT
GLOVE EXAM NITRILE LRG STRL (GLOVE) IMPLANT
GLOVE EXAM NITRILE MD LF STRL (GLOVE) ×1 IMPLANT
GLOVE SKINSENSE NS SZ6.5 (GLOVE)
GLOVE SKINSENSE NS SZ7.0 (GLOVE)
GLOVE SKINSENSE STRL SZ6.5 (GLOVE) IMPLANT
GLOVE SKINSENSE STRL SZ7.0 (GLOVE) IMPLANT
KIT VITRECTOMY (OPHTHALMIC RELATED) IMPLANT
PAD ARMBOARD 7.5X6 YLW CONV (MISCELLANEOUS) ×1 IMPLANT
PROC W NO LENS (INTRAOCULAR LENS)
PROC W SPEC LENS (INTRAOCULAR LENS)
PROCESS W NO LENS (INTRAOCULAR LENS) IMPLANT
PROCESS W SPEC LENS (INTRAOCULAR LENS) IMPLANT
RING MALYGIN (MISCELLANEOUS) IMPLANT
SIGHTPATH CAT PROC W REG LENS (Ophthalmic Related) ×2 IMPLANT
SYR TB 1ML LL NO SAFETY (SYRINGE) ×1 IMPLANT
TAPE TRANSPARENT 1/2IN (GAUZE/BANDAGES/DRESSINGS) ×1 IMPLANT
VISCOELASTIC ADDITIONAL (OPHTHALMIC RELATED) IMPLANT
WATER STERILE IRR 250ML POUR (IV SOLUTION) ×1 IMPLANT

## 2013-07-15 NOTE — Anesthesia Postprocedure Evaluation (Signed)
  Anesthesia Post-op Note  Patient: Melvin Wang  Procedure(s) Performed: Procedure(s) with comments: CATARACT EXTRACTION PHACO AND INTRAOCULAR LENS PLACEMENT (IOC) (Right) - CDE:12.84  Patient Location: Short Stay  Anesthesia Type:MAC  Level of Consciousness: awake, alert , oriented and patient cooperative  Airway and Oxygen Therapy: Patient Spontanous Breathing  Post-op Pain: none  Post-op Assessment: Post-op Vital signs reviewed, Patient's Cardiovascular Status Stable, Respiratory Function Stable, Patent Airway and Pain level controlled  Post-op Vital Signs: Reviewed and stable  Complications: No apparent anesthesia complications

## 2013-07-15 NOTE — Anesthesia Preprocedure Evaluation (Signed)
Anesthesia Evaluation  Patient identified by MRN, date of birth, ID band Patient awake    Reviewed: Allergy & Precautions, H&P , NPO status , Patient's Chart, lab work & pertinent test results  Airway Mallampati: II TM Distance: >3 FB     Dental  (+) Teeth Intact and Poor Dentition   Pulmonary former smoker,  breath sounds clear to auscultation        Cardiovascular hypertension, Pt. on medications + dysrhythmias Atrial Fibrillation Rhythm:Irregular Rate:Normal     Neuro/Psych Gait abnormality     GI/Hepatic   Endo/Other  diabetes, Type 2, Oral Hypoglycemic Agents  Renal/GU Renal InsufficiencyRenal disease     Musculoskeletal   Abdominal   Peds  Hematology   Anesthesia Other Findings   Reproductive/Obstetrics                           Anesthesia Physical Anesthesia Plan  ASA: III  Anesthesia Plan: MAC   Post-op Pain Management:    Induction: Intravenous  Airway Management Planned: Nasal Cannula  Additional Equipment:   Intra-op Plan:   Post-operative Plan:   Informed Consent: I have reviewed the patients History and Physical, chart, labs and discussed the procedure including the risks, benefits and alternatives for the proposed anesthesia with the patient or authorized representative who has indicated his/her understanding and acceptance.     Plan Discussed with:   Anesthesia Plan Comments:         Anesthesia Quick Evaluation

## 2013-07-15 NOTE — H&P (Signed)
I have reviewed the H&P, the patient was re-examined, and I have identified no interval changes in medical condition and plan of care since the history and physical of record  

## 2013-07-15 NOTE — Transfer of Care (Signed)
Immediate Anesthesia Transfer of Care Note  Patient: Melvin Wang  Procedure(s) Performed: Procedure(s) with comments: CATARACT EXTRACTION PHACO AND INTRAOCULAR LENS PLACEMENT (IOC) (Right) - CDE:12.84  Patient Location: Short Stay  Anesthesia Type:MAC  Level of Consciousness: awake, alert , oriented and patient cooperative  Airway & Oxygen Therapy: Patient Spontanous Breathing  Post-op Assessment: Report given to PACU RN, Post -op Vital signs reviewed and stable and Patient moving all extremities  Post vital signs: Reviewed and stable  Complications: No apparent anesthesia complications

## 2013-07-15 NOTE — Op Note (Signed)
Date of Admission: 07/15/2013  Date of Surgery: 07/15/2013  Pre-Op Dx: Cataract  Right  Eye  Post-Op Dx: Cataract  Right  Eye,  Dx Code 366.19  Surgeon: Gemma Payor, M.D.  Assistants: None  Anesthesia: Topical with MAC  Indications: Painless, progressive loss of vision with compromise of daily activities.  Surgery: Cataract Extraction with Intraocular lens Implant Right Eye  Discription: The patient had dilating drops and viscous lidocaine placed into the left eye in the pre-op holding area. After transfer to the operating room, a time out was performed. The patient was then prepped and draped. Beginning with a 75 degree blade a paracentesis port was made at the surgeon's 2 o'clock position. The anterior chamber was then filled with 1% non-preserved lidocaine. This was followed by filling the anterior chamber with Provisc. A bent cystatome needle was used to create a continuous tear capsulotomy. Hydrodissection was performed with balanced salt solution on a Fine canula. The lens nucleus was then removed using the phacoemulsification handpiece. Residual cortex was removed with the I&A handpiece. The anterior chamber and capsular bag were refilled with Provisc. A posterior chamber intraocular lens was placed into the capsular bag with it's injector. The implant was positioned with the Kuglan hook. The Provisc was then removed from the anterior chamber and capsular bag with the I&A handpiece. Stromal hydration of the main incision and paracentesis port was performed with BSS on a Fine canula. The wounds were tested for leak which was negative. The patient tolerated the procedure well. There were no operative complications. The patient was then transferred to the recovery room in stable condition.  Complications: None  Specimen: None  EBL: None  Prosthetic device: B&L enVista, MX60, power 21.5D, SN [id number].

## 2013-07-16 ENCOUNTER — Encounter (HOSPITAL_COMMUNITY): Payer: Self-pay | Admitting: Ophthalmology

## 2013-08-03 ENCOUNTER — Ambulatory Visit (INDEPENDENT_AMBULATORY_CARE_PROVIDER_SITE_OTHER): Payer: Medicare Other | Admitting: Pharmacist Clinician (PhC)/ Clinical Pharmacy Specialist

## 2013-08-03 DIAGNOSIS — I4891 Unspecified atrial fibrillation: Secondary | ICD-10-CM

## 2013-08-03 LAB — POCT INR: INR: 2.5

## 2013-08-17 ENCOUNTER — Other Ambulatory Visit: Payer: Self-pay

## 2013-08-17 NOTE — Telephone Encounter (Signed)
Last seen 12/22/12 ACM

## 2013-08-18 MED ORDER — FUROSEMIDE 80 MG PO TABS: 160.0000 mg | ORAL_TABLET | Freq: Two times a day (BID) | ORAL | Status: DC

## 2013-08-18 NOTE — Telephone Encounter (Signed)
Patient needs to be seen. Has exceeded time since last visit. Needs to bring all medications to next appointment.   

## 2013-08-24 NOTE — Telephone Encounter (Signed)
Patient has an appointment on 10/7 with pharmacist, is that ok or do you want him to see provider?

## 2013-08-24 NOTE — Telephone Encounter (Signed)
Patient needs to be seen by a provider to review his medical problems. Has exceeded time since last visit. Needs to bring all medications to next appointment.

## 2013-09-01 ENCOUNTER — Other Ambulatory Visit: Payer: Self-pay | Admitting: *Deleted

## 2013-09-01 NOTE — Telephone Encounter (Signed)
LAST OV 1/14. HAS APPT 09/14/13.

## 2013-09-02 MED ORDER — ATENOLOL 50 MG PO TABS: 50.0000 mg | ORAL_TABLET | Freq: Every day | ORAL | Status: DC

## 2013-09-02 NOTE — Telephone Encounter (Signed)
Prescription renewed in EPIC. 

## 2013-09-02 NOTE — Telephone Encounter (Signed)
[facility] notified of [facility]ill for his tenormin and left on voice mail

## 2013-09-06 ENCOUNTER — Other Ambulatory Visit: Payer: Self-pay | Admitting: Nurse Practitioner

## 2013-09-14 ENCOUNTER — Ambulatory Visit (INDEPENDENT_AMBULATORY_CARE_PROVIDER_SITE_OTHER): Payer: Medicare Other | Admitting: Pharmacist Clinician (PhC)/ Clinical Pharmacy Specialist

## 2013-09-14 DIAGNOSIS — Z23 Encounter for immunization: Secondary | ICD-10-CM

## 2013-09-14 DIAGNOSIS — I4891 Unspecified atrial fibrillation: Secondary | ICD-10-CM

## 2013-09-15 ENCOUNTER — Other Ambulatory Visit: Payer: Self-pay | Admitting: Family Medicine

## 2013-09-17 ENCOUNTER — Ambulatory Visit: Payer: Medicare Other | Admitting: Nurse Practitioner

## 2013-09-20 NOTE — Telephone Encounter (Signed)
Last seen 12/22/12 ACM     

## 2013-09-20 NOTE — Telephone Encounter (Signed)
Lasix refilled- patient NTBS

## 2013-09-20 NOTE — Telephone Encounter (Signed)
Last seen 12/22/12 ACM

## 2013-09-28 ENCOUNTER — Other Ambulatory Visit (INDEPENDENT_AMBULATORY_CARE_PROVIDER_SITE_OTHER): Payer: Medicare Other

## 2013-09-28 DIAGNOSIS — N390 Urinary tract infection, site not specified: Secondary | ICD-10-CM

## 2013-09-29 LAB — URINALYSIS, ROUTINE W REFLEX MICROSCOPIC
Glucose, UA: NEGATIVE
Ketones, UA: NEGATIVE
Leukocytes, UA: NEGATIVE
Nitrite, UA: NEGATIVE
RBC, UA: NEGATIVE
Specific Gravity, UA: 1.012 (ref 1.005–1.030)
Urobilinogen, Ur: 0.2 mg/dL (ref 0.0–1.9)
pH, UA: 5.5 (ref 5.0–7.5)

## 2013-09-29 LAB — PROTEIN / CREATININE RATIO, URINE

## 2013-09-30 ENCOUNTER — Telehealth: Payer: Self-pay | Admitting: Nephrology

## 2013-09-30 LAB — PROTEIN / CREATININE RATIO, URINE
Protein, Ur: 5.9 mg/dL (ref 0.0–15.0)
Protein/Creat Ratio: 64 mg/g creat (ref 0–200)

## 2013-09-30 NOTE — Telephone Encounter (Signed)
Message copied by Beryle Lathe on Thu Sep 30, 2013  4:57 PM ------      Message from: Roselyn Reef      Created: Tue Sep 28, 2013 12:52 PM      Regarding: Cancellation of Order # [id number]       Order number [id number] for the procedure URINALYSIS, ROUTINE W       REFLEX MICROSCOPIC [[id number]] has been canceled by Roselyn Reef       [[id number]]. This procedure was ordered by Trevor Iha,      MD [[id number]] on Sep 28, 2013 for the patient Melvin Wang       [[id number]]. The reason for cancellation was "Entered in Error". ------

## 2013-10-01 ENCOUNTER — Telehealth: Payer: Self-pay | Admitting: Family Medicine

## 2013-10-03 ENCOUNTER — Other Ambulatory Visit: Payer: Self-pay | Admitting: Family Medicine

## 2013-10-05 ENCOUNTER — Other Ambulatory Visit: Payer: Self-pay | Admitting: Family Medicine

## 2013-10-06 ENCOUNTER — Encounter: Payer: Self-pay | Admitting: Family Medicine

## 2013-10-06 ENCOUNTER — Ambulatory Visit (INDEPENDENT_AMBULATORY_CARE_PROVIDER_SITE_OTHER): Payer: Medicare Other | Admitting: Family Medicine

## 2013-10-06 VITALS — BP 144/75 | HR 98 | Temp 97.0°F | Ht 74.0 in | Wt 297.8 lb

## 2013-10-06 DIAGNOSIS — E119 Type 2 diabetes mellitus without complications: Secondary | ICD-10-CM

## 2013-10-06 DIAGNOSIS — R5381 Other malaise: Secondary | ICD-10-CM

## 2013-10-06 DIAGNOSIS — M109 Gout, unspecified: Secondary | ICD-10-CM

## 2013-10-06 DIAGNOSIS — E785 Hyperlipidemia, unspecified: Secondary | ICD-10-CM

## 2013-10-06 DIAGNOSIS — I10 Essential (primary) hypertension: Secondary | ICD-10-CM

## 2013-10-06 DIAGNOSIS — N189 Chronic kidney disease, unspecified: Secondary | ICD-10-CM

## 2013-10-06 DIAGNOSIS — R5383 Other fatigue: Secondary | ICD-10-CM

## 2013-10-06 NOTE — Progress Notes (Signed)
  Subjective:    Patient ID: Melvin Wang, male    DOB: Jan 15, 1941, 72 y.o.   MRN: [id number]  HPI  This 72 y.o. male presents for evaluation of follow up.  He has hx of diabetes, CKD, Atrial fibrillation and chronic coumadin therapy.  He has been seeing Nephrology Every 3 months and he is seeing [facility] Pharmacist for management of Diabetes.  He is going to do labs next week and follow up with Marcelino Duster next week. He has hx of gout and takes allopurinol 300mg  po qd.  He has no acute complaints Today.  Review of Systems No chest pain, SOB, HA, dizziness, vision change, N/V, diarrhea, constipation, dysuria, urinary urgency or frequency, myalgias, arthralgias or rash.     Objective:   Physical Exam Vital signs noted  Well developed well nourished male.  HEENT - Head atraumatic Normocephalic                Eyes - PERRLA, Conjuctiva - clear Sclera- Clear EOMI                Ears - EAC's Wnl TM's Wnl Gross Hearing WNL                Nose - Nares patent                 Throat - oropharanx wnl Respiratory - Lungs CTA bilateral Cardiac - RRR S1 and S2 without murmur GI - Abdomen soft Nontender and bowel sounds active x 4 Extremities - No edema. Neuro - Grossly intact.       Assessment & Plan:  Hypertension - Plan: POCT CBC  Hyperlipemia - Plan: Lipid panel  Diabetes - Plan: POCT glycosylated hemoglobin (Hb A1C), Lipid panel  CKD (chronic kidney disease) - Plan: CMP14+EGFR  Fatigue - Plan: POCT CBC, Vit D  25 hydroxy (rtn osteoporosis monitoring)  Gout - Plan: Uric acid  Deatra Canter FNP

## 2013-10-06 NOTE — Patient Instructions (Signed)

## 2013-10-11 ENCOUNTER — Other Ambulatory Visit: Payer: Self-pay | Admitting: *Deleted

## 2013-10-11 MED ORDER — WARFARIN SODIUM 10 MG PO TABS: 5.0000 mg | ORAL_TABLET | Freq: Every day | ORAL | Status: DC

## 2013-10-18 ENCOUNTER — Other Ambulatory Visit: Payer: Self-pay | Admitting: Nurse Practitioner

## 2013-10-20 ENCOUNTER — Other Ambulatory Visit: Payer: Self-pay | Admitting: Family Medicine

## 2013-10-25 ENCOUNTER — Other Ambulatory Visit: Payer: Self-pay | Admitting: Family Medicine

## 2013-10-26 ENCOUNTER — Ambulatory Visit (INDEPENDENT_AMBULATORY_CARE_PROVIDER_SITE_OTHER): Payer: Medicare Other | Admitting: Pharmacist Clinician (PhC)/ Clinical Pharmacy Specialist

## 2013-10-26 DIAGNOSIS — R5383 Other fatigue: Secondary | ICD-10-CM

## 2013-10-26 DIAGNOSIS — N189 Chronic kidney disease, unspecified: Secondary | ICD-10-CM

## 2013-10-26 DIAGNOSIS — E119 Type 2 diabetes mellitus without complications: Secondary | ICD-10-CM

## 2013-10-26 DIAGNOSIS — I4891 Unspecified atrial fibrillation: Secondary | ICD-10-CM

## 2013-10-26 DIAGNOSIS — M109 Gout, unspecified: Secondary | ICD-10-CM

## 2013-10-26 DIAGNOSIS — I10 Essential (primary) hypertension: Secondary | ICD-10-CM

## 2013-10-26 DIAGNOSIS — E785 Hyperlipidemia, unspecified: Secondary | ICD-10-CM

## 2013-10-26 DIAGNOSIS — R5381 Other malaise: Secondary | ICD-10-CM

## 2013-10-26 LAB — POCT CBC
Granulocyte percent: 73 %G (ref 37–80)
HCT, POC: 42.9 % — AB (ref 43.5–53.7)
Hemoglobin: 13.9 g/dL — AB (ref 14.1–18.1)
Lymph, poc: 3 (ref 0.6–3.4)
MCH, POC: 31.6 pg — AB (ref 27–31.2)
MCHC: 32.4 g/dL (ref 31.8–35.4)
MCV: 97.4 fL — AB (ref 80–97)
MPV: 7.6 fL (ref 0–99.8)
POC Granulocyte: 10.1 — AB (ref 2–6.9)
POC LYMPH PERCENT: 21.6 %L (ref 10–50)
Platelet Count, POC: 257 10*3/uL (ref 142–424)
RBC: 4.4 M/uL — AB (ref 4.69–6.13)
RDW, POC: 16.1 %
WBC: 13.9 10*3/uL — AB (ref 4.6–10.2)

## 2013-10-26 LAB — POCT GLYCOSYLATED HEMOGLOBIN (HGB A1C): Hemoglobin A1C: 7.6

## 2013-10-27 ENCOUNTER — Other Ambulatory Visit: Payer: Self-pay | Admitting: Family Medicine

## 2013-10-27 LAB — CMP14+EGFR
ALT: 24 IU/L (ref 0–44)
AST: 15 IU/L (ref 0–40)
Albumin/Globulin Ratio: 1.3 (ref 1.1–2.5)
Albumin: 4.1 g/dL (ref 3.5–4.8)
Alkaline Phosphatase: 131 IU/L — ABNORMAL HIGH (ref 39–117)
BUN/Creatinine Ratio: 30 — ABNORMAL HIGH (ref 10–22)
BUN: 43 mg/dL — ABNORMAL HIGH (ref 8–27)
CO2: 29 mmol/L (ref 18–29)
Calcium: 9.6 mg/dL (ref 8.6–10.2)
Chloride: 94 mmol/L — ABNORMAL LOW (ref 97–108)
Creatinine, Ser: 1.42 mg/dL — ABNORMAL HIGH (ref 0.76–1.27)
GFR calc Af Amer: 57 mL/min/{1.73_m2} — ABNORMAL LOW (ref >59)
GFR calc non Af Amer: 50 mL/min/{1.73_m2} — ABNORMAL LOW (ref >59)
Globulin, Total: 3.1 g/dL (ref 1.5–4.5)
Glucose: 146 mg/dL — ABNORMAL HIGH (ref 65–99)
Potassium: 4.5 mmol/L (ref 3.5–5.2)
Sodium: 139 mmol/L (ref 134–144)
Total Bilirubin: 0.9 mg/dL (ref 0.0–1.2)
Total Protein: 7.2 g/dL (ref 6.0–8.5)

## 2013-10-27 LAB — LIPID PANEL
Chol/HDL Ratio: 3.2 ratio units (ref 0.0–5.0)
Cholesterol, Total: 113 mg/dL (ref 100–199)
HDL: 35 mg/dL — ABNORMAL LOW (ref >39)
LDL Calculated: 45 mg/dL (ref 0–99)
Triglycerides: 163 mg/dL — ABNORMAL HIGH (ref 0–149)
VLDL Cholesterol Cal: 33 mg/dL (ref 5–40)

## 2013-10-27 LAB — VITAMIN D 25 HYDROXY (VIT D DEFICIENCY, FRACTURES): Vit D, 25-Hydroxy: 16.7 ng/mL — ABNORMAL LOW (ref 30.0–100.0)

## 2013-10-27 LAB — URIC ACID: Uric Acid: 6.3 mg/dL (ref 3.7–8.6)

## 2013-10-27 MED ORDER — VITAMIN D (ERGOCALCIFEROL) 1.25 MG (50000 UNIT) PO CAPS: 50000.0000 [IU] | ORAL_CAPSULE | ORAL | Status: DC

## 2013-11-07 ENCOUNTER — Other Ambulatory Visit: Payer: Self-pay | Admitting: Nurse Practitioner

## 2013-11-10 ENCOUNTER — Telehealth: Payer: Self-pay | Admitting: *Deleted

## 2013-11-10 NOTE — Telephone Encounter (Signed)
Pt.notified

## 2013-11-10 NOTE — Telephone Encounter (Signed)
Message copied by Baltazar Apo on Wed Nov 10, 2013 11:13 AM ------      Message from: Deatra Canter      Created: Wed Oct 27, 2013  8:46 AM       Vitamin D is low and sent in vitamin D rx to pharmacy, persistent leukocytosis and would       Recommend hematology consult, if agrees put in hematology consult, hgbaic is 7.6 % so       Recommend watching diabetic diet etc.. ------

## 2013-12-13 ENCOUNTER — Ambulatory Visit (INDEPENDENT_AMBULATORY_CARE_PROVIDER_SITE_OTHER): Payer: Medicare Other | Admitting: Pharmacist

## 2013-12-13 ENCOUNTER — Other Ambulatory Visit: Payer: Self-pay

## 2013-12-13 DIAGNOSIS — I4891 Unspecified atrial fibrillation: Secondary | ICD-10-CM

## 2013-12-13 LAB — POCT INR: INR: 2.5

## 2013-12-13 MED ORDER — WARFARIN SODIUM 10 MG PO TABS: 5.0000 mg | ORAL_TABLET | Freq: Every day | ORAL | Status: DC

## 2013-12-13 NOTE — Patient Instructions (Signed)
Anticoagulation Dose Instructions as of 12/13/2013     Melvin Wang Tue Wed Thu Fri Sat   New Dose 10 mg 5 mg 10 mg 5 mg 10 mg 5 mg 5 mg    Description       Continue current dose of 1 tablet sundays, tuesdays and thursdays, 1/2 tablet all other days.        INR was 2.5 today

## 2013-12-31 ENCOUNTER — Other Ambulatory Visit: Payer: Self-pay | Admitting: Family Medicine

## 2014-01-05 ENCOUNTER — Other Ambulatory Visit: Payer: Self-pay | Admitting: Nurse Practitioner

## 2014-01-12 ENCOUNTER — Encounter: Payer: Self-pay | Admitting: *Deleted

## 2014-01-17 ENCOUNTER — Other Ambulatory Visit: Payer: Self-pay | Admitting: Family Medicine

## 2014-01-24 ENCOUNTER — Other Ambulatory Visit: Payer: Self-pay | Admitting: Family Medicine

## 2014-02-03 ENCOUNTER — Other Ambulatory Visit: Payer: Self-pay | Admitting: Family Medicine

## 2014-02-09 ENCOUNTER — Ambulatory Visit (INDEPENDENT_AMBULATORY_CARE_PROVIDER_SITE_OTHER): Payer: Medicare Other | Admitting: Pharmacist

## 2014-02-09 DIAGNOSIS — I4891 Unspecified atrial fibrillation: Secondary | ICD-10-CM

## 2014-02-09 LAB — POCT INR: INR: 2.5

## 2014-02-09 NOTE — Patient Instructions (Signed)
Anticoagulation Dose Instructions as of 02/09/2014     Melvin Wang Tue Wed Thu Fri Sat   New Dose 10 mg 5 mg 10 mg 5 mg 10 mg 5 mg 5 mg    Description       Continue current dose of 1 tablet sundays, tuesdays and thursdays, 1/2 tablet all other days.        INR was 2.5 today

## 2014-02-19 ENCOUNTER — Encounter (HOSPITAL_COMMUNITY): Payer: Self-pay | Admitting: Emergency Medicine

## 2014-02-19 ENCOUNTER — Emergency Department (HOSPITAL_COMMUNITY)
Admission: EM | Admit: 2014-02-19 | Discharge: 2014-02-19 | Disposition: A | Discharge status: Home / Self Care | Payer: Medicare Other | Attending: Emergency Medicine | Admitting: Emergency Medicine

## 2014-02-19 DIAGNOSIS — N189 Chronic kidney disease, unspecified: Secondary | ICD-10-CM | POA: Insufficient documentation

## 2014-02-19 DIAGNOSIS — Z794 Long term (current) use of insulin: Secondary | ICD-10-CM | POA: Insufficient documentation

## 2014-02-19 DIAGNOSIS — Z87891 Personal history of nicotine dependence: Secondary | ICD-10-CM | POA: Insufficient documentation

## 2014-02-19 DIAGNOSIS — M109 Gout, unspecified: Secondary | ICD-10-CM | POA: Insufficient documentation

## 2014-02-19 DIAGNOSIS — Z79899 Other long term (current) drug therapy: Secondary | ICD-10-CM | POA: Insufficient documentation

## 2014-02-19 DIAGNOSIS — Z8669 Personal history of other diseases of the nervous system and sense organs: Secondary | ICD-10-CM | POA: Insufficient documentation

## 2014-02-19 DIAGNOSIS — I4891 Unspecified atrial fibrillation: Secondary | ICD-10-CM | POA: Insufficient documentation

## 2014-02-19 DIAGNOSIS — R259 Unspecified abnormal involuntary movements: Secondary | ICD-10-CM | POA: Insufficient documentation

## 2014-02-19 DIAGNOSIS — Z880 Allergy status to penicillin: Secondary | ICD-10-CM | POA: Insufficient documentation

## 2014-02-19 DIAGNOSIS — E119 Type 2 diabetes mellitus without complications: Secondary | ICD-10-CM | POA: Insufficient documentation

## 2014-02-19 DIAGNOSIS — I129 Hypertensive chronic kidney disease with stage 1 through stage 4 chronic kidney disease, or unspecified chronic kidney disease: Secondary | ICD-10-CM | POA: Insufficient documentation

## 2014-02-19 DIAGNOSIS — Z7901 Long term (current) use of anticoagulants: Secondary | ICD-10-CM | POA: Insufficient documentation

## 2014-02-19 DIAGNOSIS — R739 Hyperglycemia, unspecified: Secondary | ICD-10-CM

## 2014-02-19 LAB — CBC WITH DIFFERENTIAL/PLATELET
BASOS PCT: 0 % (ref 0–1)
Basophils Absolute: 0 10*3/uL (ref 0.0–0.1)
EOS ABS: 0.2 10*3/uL (ref 0.0–0.7)
Eosinophils Relative: 2 % (ref 0–5)
HCT: 42.1 % (ref 39.0–52.0)
Hemoglobin: 14.3 g/dL (ref 13.0–17.0)
LYMPHS ABS: 2.4 10*3/uL (ref 0.7–4.0)
Lymphocytes Relative: 18 % (ref 12–46)
MCH: 32.7 pg (ref 26.0–34.0)
MCHC: 34 g/dL (ref 30.0–36.0)
MCV: 96.3 fL (ref 78.0–100.0)
Monocytes Absolute: 0.9 10*3/uL (ref 0.1–1.0)
Monocytes Relative: 6 % (ref 3–12)
NEUTROS PCT: 74 % (ref 43–77)
Neutro Abs: 10.1 10*3/uL — ABNORMAL HIGH (ref 1.7–7.7)
Platelets: 255 10*3/uL (ref 150–400)
RBC: 4.37 MIL/uL (ref 4.22–5.81)
RDW: 15 % (ref 11.5–15.5)
WBC: 13.6 10*3/uL — ABNORMAL HIGH (ref 4.0–10.5)

## 2014-02-19 LAB — URINALYSIS, ROUTINE W REFLEX MICROSCOPIC
Bilirubin Urine: NEGATIVE
Glucose, UA: NEGATIVE mg/dL
Hgb urine dipstick: NEGATIVE
Ketones, ur: NEGATIVE mg/dL
LEUKOCYTES UA: NEGATIVE
Nitrite: NEGATIVE
PROTEIN: NEGATIVE mg/dL
Specific Gravity, Urine: 1.01 (ref 1.005–1.030)
UROBILINOGEN UA: 0.2 mg/dL (ref 0.0–1.0)
pH: 5.5 (ref 5.0–8.0)

## 2014-02-19 LAB — BASIC METABOLIC PANEL
BUN: 41 mg/dL — ABNORMAL HIGH (ref 6–23)
CALCIUM: 9.9 mg/dL (ref 8.4–10.5)
CO2: 32 mEq/L (ref 19–32)
Chloride: 90 mEq/L — ABNORMAL LOW (ref 96–112)
Creatinine, Ser: 1.29 mg/dL (ref 0.50–1.35)
GFR calc Af Amer: 63 mL/min — ABNORMAL LOW (ref >90)
GFR, EST NON AFRICAN AMERICAN: 55 mL/min — AB (ref >90)
Glucose, Bld: 213 mg/dL — ABNORMAL HIGH (ref 70–99)
Potassium: 4.3 mEq/L (ref 3.7–5.3)
Sodium: 134 mEq/L — ABNORMAL LOW (ref 137–147)

## 2014-02-19 LAB — KETONES, QUALITATIVE: Acetone, Bld: NEGATIVE

## 2014-02-19 LAB — CBG MONITORING, ED: GLUCOSE-CAPILLARY: 155 mg/dL — AB (ref 70–99)

## 2014-02-19 NOTE — ED Notes (Signed)
Pt tolerating water well. nad noted.

## 2014-02-19 NOTE — ED Provider Notes (Signed)
CSN: [id number]     Arrival date & time 02/19/14  1405 History  This chart was scribed for Ezequiel Essex, MD,  by Stacy Gardner, ED Scribe. The patient was seen in room APOTF/OTF and the patient's care was started at 3:56 PM.    First MD Initiated Contact with Patient 02/19/14 1549     Chief Complaint  Patient presents with  . Hyperglycemia     (Consider location/radiation/quality/duration/timing/severity/associated sxs/prior Treatment) Patient is a 73 y.o. male presenting with hyperglycemia. The history is provided by the patient and medical records. No language interpreter was used.  Hyperglycemia Associated symptoms: no abdominal pain, no dizziness, no fever, no nausea and no vomiting    HPI Comments: Melvin Wang is a 73 y.o. male who presents to the Emergency Department complaining of hyperglycemia for the past hour. Pt mention 130 in the morning and ranges 160-200 after meals. He takes 10 units of Lantus at night and none during the day. Pt has past medical hx of DM. Pt mentions that his kidney function is fine. He has atrial fibrilation and takes Coumadin. Denies abdominal pain. Denies headaches, light-headed ness and dizziness. Pt has tremors at baseline.  Past Medical History  Diagnosis Date  . Movement disorder   . Abnormality of gait   . Hypertension   . Diabetes mellitus without complication   . Chronic kidney disease     renal insufficiency, sees nephrologist: [facility]  . Gout   . Dysrhythmia     AFib   Past Surgical History  Procedure Laterality Date  . Cataract extraction w/phaco Left 04/19/2013    Procedure: CATARACT EXTRACTION PHACO AND INTRAOCULAR LENS PLACEMENT (IOC);  Surgeon: Tonny Branch, MD;  Location: [facility];  Service: Ophthalmology;  Laterality: Left;  CDE 29.30  . Cataract extraction w/phaco Right 07/15/2013    Procedure: CATARACT EXTRACTION PHACO AND INTRAOCULAR LENS PLACEMENT (IOC);  Surgeon: Tonny Branch, MD;  Location: [facility];  Service:  Ophthalmology;  Laterality: Right;  CDE:12.84   No family history on file. History  Substance Use Topics  . Smoking status: Former Smoker -- 1.00 packs/day for 15 years    Types: Cigarettes    Quit date: 07/07/1984  . Smokeless tobacco: Never Used  . Alcohol Use: No    Review of Systems  Constitutional: Negative for fever, chills and appetite change.  Gastrointestinal: Negative for nausea, vomiting and abdominal pain.  Neurological: Positive for tremors. Negative for dizziness, light-headedness and headaches.  All other systems reviewed and are negative.      Allergies  Acyclovir and related; Amoxicillin; Augmentin; and Mavik  Home Medications   Current Outpatient Rx  Name  Route  Sig  Dispense  Refill  . allopurinol (ZYLOPRIM) 300 MG tablet      TAKE 1 TABLET EVERY DAY   90 tablet   1   . atenolol (TENORMIN) 50 MG tablet      TAKE 1 TABLET (50 MG TOTAL) BY MOUTH DAILY.   30 tablet   1   . atorvastatin (LIPITOR) 40 MG tablet      TAKE 1 TABLET BY MOUTH AT BEDTIME   90 tablet   0   . calcitRIOL (ROCALTROL) 0.5 MCG capsule      1 tablet daily         . cholecalciferol (VITAMIN D) 1000 UNITS tablet   Oral   Take 1,000 Units by mouth daily.         . furosemide (LASIX) 80 MG tablet  TAKE 2 TABLETS (160 MG TOTAL) BY MOUTH 2 (TWO) TIMES DAILY.   120 tablet   5   . insulin glargine (LANTUS) 100 UNIT/ML injection   Subcutaneous   Inject 10 Units into the skin at bedtime.         Marland Kitchen lisinopril (PRINIVIL,ZESTRIL) 20 MG tablet      TAKE 1 TABLET BY MOUTH EVERY DAY   90 tablet   1   . warfarin (COUMADIN) 10 MG tablet   Oral   Take 5-10 mg by mouth daily. Takes 10 mg on Sat, Sun, Tues, and Thurs. On Mon, Wed, Fri, and Sat takes 5 mg          BP 113/55  Pulse 91  Temp(Src) 97.8 F (36.6 C) (Oral)  Resp 20  Ht 6\' 1"  (1.854 m)  Wt 297 lb (134.718 kg)  BMI 39.19 kg/m2  SpO2 94% Physical Exam  Constitutional: He is oriented to person,  place, and time. He appears well-developed and well-nourished. No distress.  HENT:  Head: Normocephalic and atraumatic.  Mouth/Throat: Oropharynx is clear and moist. No oropharyngeal exudate.  Moist mucus membranes  Eyes: Conjunctivae and EOM are normal. Pupils are equal, round, and reactive to light.  Neck: Normal range of motion. Neck supple.  Cardiovascular: Normal rate, regular rhythm and normal heart sounds.   No murmur heard. Pulmonary/Chest: Effort normal and breath sounds normal. No respiratory distress.  Abdominal: Soft. There is no tenderness. There is no rebound and no guarding.  Musculoskeletal: Normal range of motion. He exhibits no edema and no tenderness.  Neurological: He is alert and oriented to person, place, and time. No cranial nerve deficit. He exhibits normal muscle tone. Coordination normal.  Resting tremor, upper extremities  Skin: Skin is warm.    ED Course  Procedures (including critical care time) DIAGNOSTIC STUDIES: Oxygen Saturation is 96% on room air, normal by my interpretation.    COORDINATION OF CARE:  3:59 PM Discussed course of care with pt. Pt understands and agrees.   Labs Review Labs Reviewed  BASIC METABOLIC PANEL - Abnormal; Notable for the following:    Sodium 134 (*)    Chloride 90 (*)    Glucose, Bld 213 (*)    BUN 41 (*)    GFR calc non Af Amer 55 (*)    GFR calc Af Amer 63 (*)    All other components within normal limits  CBC WITH DIFFERENTIAL - Abnormal; Notable for the following:    WBC 13.6 (*)    Neutro Abs 10.1 (*)    All other components within normal limits  CBG MONITORING, ED - Abnormal; Notable for the following:    Glucose-Capillary 155 (*)    All other components within normal limits  KETONES, QUALITATIVE  URINALYSIS, ROUTINE W REFLEX MICROSCOPIC  BLOOD GAS, VENOUS   Imaging Review No results found.   EKG Interpretation None      MDM   Final diagnoses:  Hyperglycemia   Patient comes in with elevated  blood sugars to 200s at home. He is on Lantus 10 units and states compliance. Denies any fever, nausea, vomiting, chest pain or shortness of breath. He is on Coumadin for history of atrial fibrillation.  On arrival blood sugar is 213. Patient has no evidence of DKA. No ketones in urine. Normal anion gap of 12. By mouth fluids, sugar has come down to 155. He is stable and in no distress. No evidence of DKA.  Discussed with patient that these  levels are mildly elevated. Needs follow up with PCP this week. Return precautions discussed.   Ezequiel Essex, MD 02/19/14 [phone]

## 2014-02-19 NOTE — ED Notes (Signed)
Pt states glucose is running high at 225 one hour PTA. States glucose and been running higher than usual intermittently x several weeks. Normally run around 130.

## 2014-02-19 NOTE — Discharge Instructions (Signed)

## 2014-02-21 ENCOUNTER — Encounter: Payer: Self-pay | Admitting: Family Medicine

## 2014-02-21 ENCOUNTER — Ambulatory Visit (INDEPENDENT_AMBULATORY_CARE_PROVIDER_SITE_OTHER): Payer: Medicare Other | Admitting: Family Medicine

## 2014-02-21 VITALS — BP 121/69 | HR 93 | Temp 97.1°F | Ht 74.0 in | Wt 296.6 lb

## 2014-02-21 DIAGNOSIS — E119 Type 2 diabetes mellitus without complications: Secondary | ICD-10-CM

## 2014-02-21 LAB — POCT GLYCOSYLATED HEMOGLOBIN (HGB A1C): Hemoglobin A1C: 8.3

## 2014-02-21 NOTE — Progress Notes (Signed)
   Subjective:    Patient ID: Melvin Wang, male    DOB: 11-05-1941, 73 y.o.   MRN: [id number]  HPI This 73 y.o. male presents for evaluation of elevated blood sugars.  He has been to [facility] ED for FSBS of 250 and was worried about his glucose being so elevated.  He is taking Lantus 10 unit sq qhs.  Review of Systems    No chest pain, SOB, HA, dizziness, vision change, N/V, diarrhea, constipation, dysuria, urinary urgency or frequency, myalgias, arthralgias or rash.  Objective:   Physical Exam Vital signs noted  Well developed well nourished male.  HEENT - Head atraumatic Normocephalic                Eyes - PERRLA, Conjuctiva - clear Sclera- Clear EOMI                Ears - EAC's Wnl TM's Wnl Gross Hearing WNL                Throat - oropharanx wnl Respiratory - Lungs CTA bilateral Cardiac - RRR S1 and S2 without murmur GI - Abdomen soft Nontender and bowel sounds active x 4 Extremities - No edema. Neuro - Grossly intact.       Assessment & Plan:  Diabetes - Plan: POCT glycosylated hemoglobin (Hb A1C) Increase lantus insulin to 12 units sq and then increase one unit a day until fsbs fasting is 120 and lower. Follow up in 3 months.  Lysbeth Penner FNP

## 2014-03-01 ENCOUNTER — Other Ambulatory Visit: Payer: Self-pay | Admitting: *Deleted

## 2014-03-01 MED ORDER — GLUCOSE BLOOD VI STRP: ORAL_STRIP | Status: DC

## 2014-03-11 ENCOUNTER — Other Ambulatory Visit: Payer: Self-pay | Admitting: Family Medicine

## 2014-03-13 ENCOUNTER — Other Ambulatory Visit: Payer: Self-pay | Admitting: Family Medicine

## 2014-03-18 ENCOUNTER — Other Ambulatory Visit: Payer: Self-pay | Admitting: Family Medicine

## 2014-03-28 ENCOUNTER — Ambulatory Visit (INDEPENDENT_AMBULATORY_CARE_PROVIDER_SITE_OTHER): Payer: Medicare Other | Admitting: Pharmacist

## 2014-03-28 DIAGNOSIS — I4891 Unspecified atrial fibrillation: Secondary | ICD-10-CM

## 2014-03-28 LAB — POCT INR: INR: 2.6

## 2014-04-13 ENCOUNTER — Other Ambulatory Visit: Payer: Self-pay | Admitting: Family Medicine

## 2014-04-15 NOTE — Telephone Encounter (Signed)
Last seen 02/21/14  B [place]  Last lipid 10/26/13  Requesting 90 day supply

## 2014-04-24 ENCOUNTER — Other Ambulatory Visit: Payer: Self-pay | Admitting: Family Medicine

## 2014-04-26 ENCOUNTER — Other Ambulatory Visit: Payer: Self-pay | Admitting: Family Medicine

## 2014-05-05 ENCOUNTER — Other Ambulatory Visit: Payer: Self-pay | Admitting: Family Medicine

## 2014-05-10 ENCOUNTER — Ambulatory Visit (INDEPENDENT_AMBULATORY_CARE_PROVIDER_SITE_OTHER): Payer: Medicare Other | Admitting: Pharmacist Clinician (PhC)/ Clinical Pharmacy Specialist

## 2014-05-10 DIAGNOSIS — I4891 Unspecified atrial fibrillation: Secondary | ICD-10-CM

## 2014-05-10 DIAGNOSIS — E1159 Type 2 diabetes mellitus with other circulatory complications: Secondary | ICD-10-CM

## 2014-05-10 LAB — POCT INR: INR: 2.9

## 2014-05-10 MED ORDER — SAXAGLIPTIN HCL 2.5 MG PO TABS: 2.5000 mg | ORAL_TABLET | Freq: Every day | ORAL | Status: DC

## 2014-05-11 ENCOUNTER — Other Ambulatory Visit: Payer: Self-pay | Admitting: Nurse Practitioner

## 2014-05-12 ENCOUNTER — Other Ambulatory Visit: Payer: Self-pay

## 2014-05-12 MED ORDER — FUROSEMIDE 80 MG PO TABS: ORAL_TABLET | ORAL | Status: DC

## 2014-05-12 MED ORDER — ATENOLOL 50 MG PO TABS: ORAL_TABLET | ORAL | Status: DC

## 2014-05-12 NOTE — Telephone Encounter (Signed)
Last seen and glucose 02/21/14  B [place]  Requesting a 90 day supply

## 2014-05-18 ENCOUNTER — Telehealth: Payer: Self-pay | Admitting: Family Medicine

## 2014-05-18 NOTE — Telephone Encounter (Signed)
Spoke with patient - recommended that he hold onglyza.  Follow up in 1 month as planned Patient is to continue to check BG - he is instructed to call office if he has more than 1 BG over 150 within 1 week.

## 2014-05-18 NOTE — Telephone Encounter (Signed)
Patient started on Onglyza last Tuesday and now having sever gas and diarrhea patient has stopped taking the med's also having an itchy spot on his knee wants to know what he needs to do?

## 2014-06-10 ENCOUNTER — Other Ambulatory Visit: Payer: Self-pay | Admitting: Family Medicine

## 2014-06-28 ENCOUNTER — Ambulatory Visit (INDEPENDENT_AMBULATORY_CARE_PROVIDER_SITE_OTHER): Payer: Medicare Other | Admitting: Pharmacist

## 2014-06-28 DIAGNOSIS — I4891 Unspecified atrial fibrillation: Secondary | ICD-10-CM

## 2014-06-28 LAB — POCT INR: INR: 2.8

## 2014-06-28 NOTE — Patient Instructions (Signed)
Anticoagulation Dose Instructions as of 06/28/2014     Melvin Wang Tue Wed Thu Fri Sat   New Dose 10 mg 5 mg 10 mg 5 mg 10 mg 5 mg 5 mg    Description       Continue current dose of 1 tablet sundays, tuesdays and thursdays, 1/2 tablet all other days.        INR was 2.8 today

## 2014-06-29 ENCOUNTER — Other Ambulatory Visit: Payer: Self-pay | Admitting: Family Medicine

## 2014-07-06 ENCOUNTER — Encounter: Payer: Self-pay | Admitting: *Deleted

## 2014-07-11 ENCOUNTER — Other Ambulatory Visit: Payer: Self-pay | Admitting: Family Medicine

## 2014-07-13 NOTE — Telephone Encounter (Signed)
Patient last seen in office on 02-21-14. Last lipids drawn on 11-14. Please advise on 90 day refill.

## 2014-07-23 ENCOUNTER — Other Ambulatory Visit: Payer: Self-pay | Admitting: Family Medicine

## 2014-08-02 ENCOUNTER — Other Ambulatory Visit: Payer: Self-pay | Admitting: Family Medicine

## 2014-08-04 ENCOUNTER — Telehealth: Payer: Self-pay | Admitting: Family Medicine

## 2014-08-04 NOTE — Telephone Encounter (Signed)
Do not carry samples of livalo.  Pt aware

## 2014-08-05 NOTE — Telephone Encounter (Signed)
Last seen 02/21/14  B [place]  Requesting 90 day supply

## 2014-08-09 ENCOUNTER — Ambulatory Visit (INDEPENDENT_AMBULATORY_CARE_PROVIDER_SITE_OTHER): Payer: Medicare Other | Admitting: Pharmacist Clinician (PhC)/ Clinical Pharmacy Specialist

## 2014-08-09 DIAGNOSIS — I4891 Unspecified atrial fibrillation: Secondary | ICD-10-CM

## 2014-08-09 LAB — POCT INR: INR: 2.3

## 2014-08-10 ENCOUNTER — Telehealth: Payer: Self-pay | Admitting: Family Medicine

## 2014-08-10 NOTE — Telephone Encounter (Signed)
Daughter notified not able to set up appt to discuss her father since not on HIPPA.

## 2014-08-25 ENCOUNTER — Ambulatory Visit: Payer: Medicare Other | Admitting: Family Medicine

## 2014-08-26 ENCOUNTER — Ambulatory Visit (INDEPENDENT_AMBULATORY_CARE_PROVIDER_SITE_OTHER): Payer: Medicare Other | Admitting: Family Medicine

## 2014-08-26 ENCOUNTER — Encounter: Payer: Self-pay | Admitting: Family Medicine

## 2014-08-26 VITALS — BP 123/62 | HR 87 | Temp 97.4°F | Ht 74.0 in | Wt 285.6 lb

## 2014-08-26 DIAGNOSIS — N400 Enlarged prostate without lower urinary tract symptoms: Secondary | ICD-10-CM

## 2014-08-26 DIAGNOSIS — R259 Unspecified abnormal involuntary movements: Secondary | ICD-10-CM

## 2014-08-26 DIAGNOSIS — E1065 Type 1 diabetes mellitus with hyperglycemia: Secondary | ICD-10-CM

## 2014-08-26 DIAGNOSIS — E108 Type 1 diabetes mellitus with unspecified complications: Principal | ICD-10-CM

## 2014-08-26 DIAGNOSIS — IMO0002 Reserved for concepts with insufficient information to code with codable children: Secondary | ICD-10-CM

## 2014-08-26 DIAGNOSIS — E785 Hyperlipidemia, unspecified: Secondary | ICD-10-CM

## 2014-08-26 DIAGNOSIS — E119 Type 2 diabetes mellitus without complications: Secondary | ICD-10-CM

## 2014-08-26 DIAGNOSIS — R251 Tremor, unspecified: Secondary | ICD-10-CM

## 2014-08-26 LAB — POCT CBC
Granulocyte percent: 76 %G (ref 37–80)
HCT, POC: 37.1 % — AB (ref 43.5–53.7)
Hemoglobin: 12.3 g/dL — AB (ref 14.1–18.1)
Lymph, poc: 2.5 (ref 0.6–3.4)
MCH, POC: 32.2 pg — AB (ref 27–31.2)
MCHC: 33.2 g/dL (ref 31.8–35.4)
MCV: 97 fL (ref 80–97)
MPV: 7.2 fL (ref 0–99.8)
POC Granulocyte: 8.8 — AB (ref 2–6.9)
POC LYMPH PERCENT: 21.2 %L (ref 10–50)
Platelet Count, POC: 222 10*3/uL (ref 142–424)
RBC: 3.8 M/uL — AB (ref 4.69–6.13)
RDW, POC: 16.1 %
WBC: 11.6 10*3/uL — AB (ref 4.6–10.2)

## 2014-08-26 LAB — POCT GLYCOSYLATED HEMOGLOBIN (HGB A1C): Hemoglobin A1C: 7.3

## 2014-08-26 NOTE — Progress Notes (Signed)
   Subjective:    Patient ID: Melvin Wang, male    DOB: [birth year], 73 y.o.   MRN: [id number]  HPI This 74 y.o. male presents for evaluation of CPE.  He has hx of diabetes, atrial fibrillation, tremors, And hypertension.  He has hx of HOH and wears hearing aids.  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 He is having fatigue and feels tired.  He denies any snoring and denies having chest pain.   Review of Systems C/o fatigue   No chest pain, SOB, HA, dizziness, vision change, N/V, diarrhea, constipation, dysuria, urinary urgency or frequency, myalgias, arthralgias or rash.  Objective:   Physical Exam Vital signs noted  Well developed well nourished male.  HEENT - Head atraumatic Normocephalic                Eyes - PERRLA, Conjuctiva - clear Sclera- Clear EOMI                Ears - EAC's Wnl TM's Wnl Gross Hearing WNL                Nose - Nares patent                 Throat - oropharanx wnl Respiratory - Lungs CTA bilateral Cardiac - RRR S1 and S2 without murmur GI - Abdomen soft Nontender and bowel sounds active x 4 Extremities - No edema. Neuro - Grossly intact.  Results for orders placed in visit on 08/26/14  POCT GLYCOSYLATED HEMOGLOBIN (HGB A1C)      Result Value Ref Range   Hemoglobin A1C 7.3 %    POCT CBC      Result Value Ref Range   WBC 11.6 (*) 4.6 - 10.2 K/uL   Lymph, poc 2.5  0.6 - 3.4   POC LYMPH PERCENT 21.2  10 - 50 %L   MID (cbc)    0 - 0.9   POC MID %    0 - 12 %M   POC Granulocyte 8.8 (*) 2 - 6.9   Granulocyte percent 76.0  37 - 80 %G   RBC 3.8 (*) 4.69 - 6.13 M/uL   Hemoglobin 12.3 (*) 14.1 - 18.1 g/dL   HCT, POC 37.1 (*) 43.5 - 53.7 %   MCV 97.0  80 - 97 fL   MCH, POC 32.2 (*) 27 - 31.2 pg   MCHC 33.2  31.8 - 35.4 g/dL   RDW, POC 16.1     Platelet Count, POC 222.0  142 - 424 K/uL   MPV 7.2  0 - 99.8 fL       Assessment & Plan:    Type II or unspecified type diabetes mellitus without mention of  complication, not stated as uncontrolled - Plan: POCT glycosylated hemoglobin (Hb A1C), POCT CBC, CMP14+EGFR, Vit D  25 hydroxy (rtn osteoporosis monitoring), TSH  BPH (benign prostatic hyperplasia) - Plan: PSA, total and free  Other and unspecified hyperlipidemia - Plan: Lipid panel  Tremors of nervous system - Plan: Vitamin B12  Lysbeth Penner FNP

## 2014-08-27 LAB — LIPID PANEL
Chol/HDL Ratio: 2.8 ratio units (ref 0.0–5.0)
Cholesterol, Total: 98 mg/dL — ABNORMAL LOW (ref 100–199)
HDL: 35 mg/dL — ABNORMAL LOW (ref >39)
LDL Calculated: 41 mg/dL (ref 0–99)
Triglycerides: 111 mg/dL (ref 0–149)
VLDL Cholesterol Cal: 22 mg/dL (ref 5–40)

## 2014-08-27 LAB — CMP14+EGFR
ALT: 26 IU/L (ref 0–44)
AST: 19 IU/L (ref 0–40)
Albumin/Globulin Ratio: 1.3 (ref 1.1–2.5)
Albumin: 4.1 g/dL (ref 3.5–4.8)
Alkaline Phosphatase: 137 IU/L — ABNORMAL HIGH (ref 39–117)
BUN/Creatinine Ratio: 23 — ABNORMAL HIGH (ref 10–22)
BUN: 31 mg/dL — ABNORMAL HIGH (ref 8–27)
CO2: 25 mmol/L (ref 18–29)
Calcium: 9.5 mg/dL (ref 8.6–10.2)
Chloride: 94 mmol/L — ABNORMAL LOW (ref 97–108)
Creatinine, Ser: 1.32 mg/dL — ABNORMAL HIGH (ref 0.76–1.27)
GFR calc Af Amer: 62 mL/min/{1.73_m2} (ref >59)
GFR calc non Af Amer: 54 mL/min/{1.73_m2} — ABNORMAL LOW (ref >59)
Globulin, Total: 3.2 g/dL (ref 1.5–4.5)
Glucose: 112 mg/dL — ABNORMAL HIGH (ref 65–99)
Potassium: 4.9 mmol/L (ref 3.5–5.2)
Sodium: 136 mmol/L (ref 134–144)
Total Bilirubin: 1.1 mg/dL (ref 0.0–1.2)
Total Protein: 7.3 g/dL (ref 6.0–8.5)

## 2014-08-27 LAB — PSA, TOTAL AND FREE
PSA, Free Pct: 12.7 %
PSA, Free: 0.28 ng/mL
PSA: 2.2 ng/mL (ref 0.0–4.0)

## 2014-08-27 LAB — VITAMIN B12: Vitamin B-12: 586 pg/mL (ref 211–946)

## 2014-08-27 LAB — VITAMIN D 25 HYDROXY (VIT D DEFICIENCY, FRACTURES): Vit D, 25-Hydroxy: 35.4 ng/mL (ref 30.0–100.0)

## 2014-08-27 LAB — TSH: TSH: 1.03 u[IU]/mL (ref 0.450–4.500)

## 2014-09-09 ENCOUNTER — Other Ambulatory Visit: Payer: Self-pay | Admitting: Family Medicine

## 2014-09-20 ENCOUNTER — Ambulatory Visit (INDEPENDENT_AMBULATORY_CARE_PROVIDER_SITE_OTHER): Payer: Medicare Other | Admitting: Pharmacist Clinician (PhC)/ Clinical Pharmacy Specialist

## 2014-09-20 ENCOUNTER — Ambulatory Visit: Payer: Medicare Other

## 2014-09-20 DIAGNOSIS — Z23 Encounter for immunization: Secondary | ICD-10-CM

## 2014-09-20 DIAGNOSIS — I4891 Unspecified atrial fibrillation: Secondary | ICD-10-CM

## 2014-09-20 LAB — POCT INR: INR: 2.8

## 2014-09-23 ENCOUNTER — Other Ambulatory Visit: Payer: Self-pay

## 2014-10-13 ENCOUNTER — Other Ambulatory Visit: Payer: Self-pay | Admitting: Family Medicine

## 2014-10-29 ENCOUNTER — Other Ambulatory Visit: Payer: Self-pay | Admitting: Family Medicine

## 2014-11-14 ENCOUNTER — Ambulatory Visit (INDEPENDENT_AMBULATORY_CARE_PROVIDER_SITE_OTHER): Payer: Medicare Other | Admitting: Pharmacist

## 2014-11-14 DIAGNOSIS — I4891 Unspecified atrial fibrillation: Secondary | ICD-10-CM

## 2014-11-14 LAB — POCT INR: INR: 2.3

## 2014-11-24 ENCOUNTER — Other Ambulatory Visit: Payer: Self-pay | Admitting: Nurse Practitioner

## 2014-11-26 ENCOUNTER — Other Ambulatory Visit: Payer: Self-pay | Admitting: Family Medicine

## 2014-12-06 ENCOUNTER — Ambulatory Visit (INDEPENDENT_AMBULATORY_CARE_PROVIDER_SITE_OTHER): Payer: Medicare Other | Admitting: Pharmacist Clinician (PhC)/ Clinical Pharmacy Specialist

## 2014-12-06 ENCOUNTER — Encounter: Payer: Self-pay | Admitting: Pharmacist Clinician (PhC)/ Clinical Pharmacy Specialist

## 2014-12-06 DIAGNOSIS — E0822 Diabetes mellitus due to underlying condition with diabetic chronic kidney disease: Secondary | ICD-10-CM

## 2014-12-06 MED ORDER — METFORMIN HCL 500 MG PO TABS
500.0000 mg | ORAL_TABLET | Freq: Two times a day (BID) | ORAL | Status: DC
Start: 2014-12-06 — End: 2014-12-20

## 2014-12-06 NOTE — Progress Notes (Signed)
Patient is a clinic walk-in for questions regarding increases in his blood glucose levels at home over past several days.  HGB have increased in the am to 170-180mg /dL.  He is only taking Lantus.  Patient ran out of Onglyza samples and could not afford to take them.  His renal function is stable (patient sees Dr. Detterding for nephrology) with last serum creatinine of 1.3mg /dL.  Will start patient on metformin 500mg  bid with food and monitor blood glucose response and serum creatinine closely.  Discuss medication with patient and what to expect with side effects and risk of lactic acidosis if his renal function declines which will necesitate the need to stop metformin.  Patient will be back in the next two weeks for an INR check.

## 2014-12-08 ENCOUNTER — Other Ambulatory Visit: Payer: Self-pay | Admitting: Family Medicine

## 2014-12-20 ENCOUNTER — Other Ambulatory Visit: Payer: Self-pay | Admitting: Family Medicine

## 2014-12-20 ENCOUNTER — Ambulatory Visit (INDEPENDENT_AMBULATORY_CARE_PROVIDER_SITE_OTHER): Payer: Medicare Other | Admitting: Pharmacist Clinician (PhC)/ Clinical Pharmacy Specialist

## 2014-12-20 DIAGNOSIS — I4891 Unspecified atrial fibrillation: Secondary | ICD-10-CM | POA: Diagnosis not present

## 2014-12-20 DIAGNOSIS — Z23 Encounter for immunization: Secondary | ICD-10-CM

## 2014-12-20 LAB — POCT INR: INR: 2.8

## 2014-12-20 NOTE — Addendum Note (Signed)
Addended by: Thana Ates on: 12/20/2014 10:22 AM   Modules accepted: Orders

## 2015-01-09 ENCOUNTER — Other Ambulatory Visit: Payer: Self-pay | Admitting: Family Medicine

## 2015-01-12 ENCOUNTER — Other Ambulatory Visit: Payer: Self-pay | Admitting: Family Medicine

## 2015-01-17 DIAGNOSIS — E1142 Type 2 diabetes mellitus with diabetic polyneuropathy: Secondary | ICD-10-CM | POA: Diagnosis not present

## 2015-01-17 DIAGNOSIS — B351 Tinea unguium: Secondary | ICD-10-CM | POA: Diagnosis not present

## 2015-01-17 DIAGNOSIS — L84 Corns and callosities: Secondary | ICD-10-CM | POA: Diagnosis not present

## 2015-01-25 ENCOUNTER — Other Ambulatory Visit: Payer: Self-pay | Admitting: Family Medicine

## 2015-01-27 ENCOUNTER — Ambulatory Visit (INDEPENDENT_AMBULATORY_CARE_PROVIDER_SITE_OTHER): Payer: Medicare Other | Admitting: Family Medicine

## 2015-01-27 ENCOUNTER — Encounter: Payer: Self-pay | Admitting: Family Medicine

## 2015-01-27 VITALS — BP 116/58 | HR 83 | Temp 97.1°F | Ht 74.0 in | Wt 284.6 lb

## 2015-01-27 DIAGNOSIS — N189 Chronic kidney disease, unspecified: Secondary | ICD-10-CM | POA: Diagnosis not present

## 2015-01-27 DIAGNOSIS — I4891 Unspecified atrial fibrillation: Secondary | ICD-10-CM

## 2015-01-27 DIAGNOSIS — E0822 Diabetes mellitus due to underlying condition with diabetic chronic kidney disease: Secondary | ICD-10-CM | POA: Diagnosis not present

## 2015-01-27 DIAGNOSIS — I10 Essential (primary) hypertension: Secondary | ICD-10-CM

## 2015-01-27 DIAGNOSIS — M1A09X0 Idiopathic chronic gout, multiple sites, without tophus (tophi): Secondary | ICD-10-CM | POA: Diagnosis not present

## 2015-01-27 LAB — POCT INR: INR: 2.9

## 2015-01-27 LAB — POCT GLYCOSYLATED HEMOGLOBIN (HGB A1C): HEMOGLOBIN A1C: 8.1

## 2015-01-27 MED ORDER — INSULIN GLARGINE 100 UNIT/ML SOLOSTAR PEN: 25.0000 [IU] | PEN_INJECTOR | Freq: Every morning | SUBCUTANEOUS | Status: DC

## 2015-01-27 MED ORDER — SITAGLIPTIN PHOSPHATE 50 MG PO TABS: 50.0000 mg | ORAL_TABLET | Freq: Every day | ORAL | Status: DC

## 2015-01-27 NOTE — Patient Instructions (Signed)

## 2015-01-27 NOTE — Progress Notes (Signed)
Subjective:  Patient ID: Melvin Wang, male    DOB: May 01, 1941  Age: 74 y.o. MRN: [id number]  CC: Diabetes; Hypertension; Hyperlipidemia; and Gout   HPI  Melvin Wang presents forPatient in for follow-up of hypertension. Patient has no history of headache chest pain or shortness of breath or recent cough. Patient also denies symptoms of TIA such as numbness weakness lateralizing. Patient checks  blood pressure at home and has not had any elevated readings recently. Patient denies side effects from his medication. States taking it regularly. Patient in for follow-up of elevated cholesterol. Doing well without complaints on current medication. Denies side effects of statin including myalgia and arthralgia and nausea. Also in today for liver function testing. Currently no chest pain, shortness of breath or other cardiovascular related symptoms noted. His gout has been quiescent. He is to uric acid level.  Patient does not check blood sugar at home Patient denies symptoms such as polyuria, polydipsia, excessive hunger, nausea No significant hypoglycemic spells noted. No edema related to diabetic nephropathy noted. Medications as noted below. Taking them regularly without complication/adverse reaction being reported today.   History Melvin Wang has a past medical history of Movement disorder; Abnormality of gait; Hypertension; Diabetes mellitus without complication; Chronic kidney disease; Gout; and Dysrhythmia.   He has past surgical history that includes Cataract extraction w/PHACO (Left, 04/19/2013) and Cataract extraction w/PHACO (Right, 07/15/2013).   His family history is not on file.He reports that he quit smoking about 30 years ago. His smoking use included Cigarettes. He has a 15 pack-year smoking history. He has never used smokeless tobacco. He reports that he does not drink alcohol or use illicit drugs.  Current Outpatient Prescriptions on File Prior to Visit  Medication Sig  Dispense Refill  . allopurinol (ZYLOPRIM) 300 MG tablet TAKE 1 TABLET EVERY DAY 90 tablet 1  . atenolol (TENORMIN) 50 MG tablet TAKE 1 TABLET (50 MG TOTAL) BY MOUTH DAILY. 90 tablet 1  . atorvastatin (LIPITOR) 40 MG tablet TAKE 1 TABLET BY MOUTH AT BEDTIME 90 tablet 0  . B-D UF III MINI PEN NEEDLES 31G X 5 MM MISC USE 1 TIME DAILY WITH LANTUS SOLOSTAR 100 each 1  . calcitRIOL (ROCALTROL) 0.5 MCG capsule 1 tablet daily    . furosemide (LASIX) 80 MG tablet TAKE 2 TABLETS (160 MG TOTAL) BY MOUTH 2 (TWO) TIMES DAILY. 360 tablet 0  . glucose blood (ONE TOUCH ULTRA TEST) test strip Use as instructed 100 each 3  . lisinopril (PRINIVIL,ZESTRIL) 20 MG tablet TAKE 1 TABLET BY MOUTH EVERY DAY (Patient taking differently: take 1/2 tablet daily) 30 tablet 1  . warfarin (COUMADIN) 10 MG tablet TAKE 1/2 TABLET DAILY ON MON,WED, AND FRIDAY AND ON TUES, THUR , SAT, AND SUN TAKE 1 TABLET DAILY 90 tablet 1   No current facility-administered medications on file prior to visit.    ROS Review of Systems  Constitutional: Negative for fever, chills, diaphoresis and unexpected weight change.  HENT: Negative for congestion, hearing loss, rhinorrhea, sore throat and trouble swallowing.   Respiratory: Negative for cough, chest tightness, shortness of breath and wheezing.   Gastrointestinal: Negative for nausea, vomiting, abdominal pain, diarrhea, constipation and abdominal distention.  Endocrine: Negative for cold intolerance and heat intolerance.  Genitourinary: Negative for dysuria, hematuria and flank pain.  Musculoskeletal: Negative for joint swelling and arthralgias.  Skin: Negative for rash.  Neurological: Negative for dizziness and headaches.  Psychiatric/Behavioral: Negative for dysphoric mood, decreased concentration and agitation. The patient is  not nervous/anxious.     Objective:  BP 116/58 mmHg  Pulse 83  Temp(Src) 97.1 F (36.2 C) (Oral)  Ht 6' 2"  (1.88 m)  Wt 284 lb 9.6 oz (129.094 kg)  BMI  36.53 kg/m2  BP Readings from Last 3 Encounters:  01/27/15 116/58  08/26/14 123/62  02/21/14 121/69    Wt Readings from Last 3 Encounters:  01/27/15 284 lb 9.6 oz (129.094 kg)  08/26/14 285 lb 9.6 oz (129.547 kg)  02/21/14 296 lb 9.6 oz (134.537 kg)     Physical Exam  Constitutional: He is oriented to person, place, and time. He appears well-developed and well-nourished. No distress.  HENT:  Head: Normocephalic and atraumatic.  Right Ear: External ear normal.  Left Ear: External ear normal.  Nose: Nose normal.  Mouth/Throat: Oropharynx is clear and moist.  Eyes: Conjunctivae and EOM are normal. Pupils are equal, round, and reactive to light.  Neck: Normal range of motion. Neck supple. No thyromegaly present.  Cardiovascular: Normal rate, regular rhythm and normal heart sounds.   No murmur heard. Pulmonary/Chest: Effort normal and breath sounds normal. No respiratory distress. He has no wheezes. He has no rales.  Abdominal: Soft. Bowel sounds are normal. He exhibits no distension. There is no tenderness.  Lymphadenopathy:    He has no cervical adenopathy.  Neurological: He is alert and oriented to person, place, and time. He has normal reflexes.  Skin: Skin is warm and dry.  Psychiatric: He has a normal mood and affect. His behavior is normal. Judgment and thought content normal.    Lab Results  Component Value Date   HGBA1C 8.1% 01/27/2015   HGBA1C 7.3 % 08/26/2014   HGBA1C 8.3 02/21/2014    Lab Results  Component Value Date   WBC 11.6* 08/26/2014   HGB 12.3* 08/26/2014   HCT 37.1* 08/26/2014   PLT 255 02/19/2014   GLUCOSE 201* 01/27/2015   CHOL 126 01/27/2015   TRIG 190* 01/27/2015   HDL 38* 01/27/2015   LDLCALC 41 08/26/2014   ALT 23 01/27/2015   AST 19 01/27/2015   NA 136 01/27/2015   K 4.2 01/27/2015   CL 91* 01/27/2015   CREATININE 1.57* 01/27/2015   BUN 56* 01/27/2015   CO2 25 01/27/2015   TSH 1.030 08/26/2014   PSA 2.2 08/26/2014   INR 2.9  01/27/2015   HGBA1C 8.1% 01/27/2015    No results found.  Assessment & Plan:   Melvin Wang was seen today for diabetes, hypertension, hyperlipidemia and gout.  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 -     Insulin Glargine (LANTUS SOLOSTAR) 100 UNIT/ML Solostar Pen; Inject 25 Units into the skin every morning.   I have discontinued Mr. Hellums insulin glargine, cholecalciferol, and LANTUS SOLOSTAR. I am also having him start on sitaGLIPtin and Insulin Glargine. Additionally, I am having him maintain his calcitRIOL, glucose blood, furosemide, B-D UF III MINI PEN NEEDLES, lisinopril, warfarin, atenolol, atorvastatin, and allopurinol.  Meds ordered this encounter  Medications  . sitaGLIPtin (JANUVIA) 50 MG tablet    Sig:  Take 1 tablet (50 mg total) by mouth daily.    Dispense:  30 tablet    Refill:  5  . Insulin Glargine (LANTUS SOLOSTAR) 100 UNIT/ML Solostar Pen    Sig: Inject 25 Units into the skin every morning.    Dispense:  5 pen    Refill:  PRN    Follow-up: Return in about 3 months (around 04/27/2015) for diabetes.  Claretta Fraise, M.D.

## 2015-01-28 LAB — CMP14+EGFR
ALK PHOS: 133 IU/L — AB (ref 39–117)
ALT: 23 IU/L (ref 0–44)
AST: 19 IU/L (ref 0–40)
Albumin/Globulin Ratio: 1.3 (ref 1.1–2.5)
Albumin: 4.2 g/dL (ref 3.5–4.8)
BUN/Creatinine Ratio: 36 — ABNORMAL HIGH (ref 10–22)
BUN: 56 mg/dL — ABNORMAL HIGH (ref 8–27)
Bilirubin Total: 1.1 mg/dL (ref 0.0–1.2)
CO2: 25 mmol/L (ref 18–29)
Calcium: 9.9 mg/dL (ref 8.6–10.2)
Chloride: 91 mmol/L — ABNORMAL LOW (ref 97–108)
Creatinine, Ser: 1.57 mg/dL — ABNORMAL HIGH (ref 0.76–1.27)
GFR, EST AFRICAN AMERICAN: 51 mL/min/{1.73_m2} — AB (ref >59)
GFR, EST NON AFRICAN AMERICAN: 44 mL/min/{1.73_m2} — AB (ref >59)
GLOBULIN, TOTAL: 3.3 g/dL (ref 1.5–4.5)
Glucose: 201 mg/dL — ABNORMAL HIGH (ref 65–99)
POTASSIUM: 4.2 mmol/L (ref 3.5–5.2)
Sodium: 136 mmol/L (ref 134–144)
TOTAL PROTEIN: 7.5 g/dL (ref 6.0–8.5)

## 2015-01-28 LAB — NMR, LIPOPROFILE
Cholesterol: 126 mg/dL (ref 100–199)
HDL Cholesterol by NMR: 38 mg/dL — ABNORMAL LOW (ref >39)
HDL Particle Number: 25.4 umol/L — ABNORMAL LOW (ref >=30.5)
LDL Particle Number: 767 nmol/L (ref <1000)
LDL Size: 20.4 nm (ref >20.5)
LDL-C: 50 mg/dL (ref 0–99)
LP-IR SCORE: 77 — AB (ref <=45)
Small LDL Particle Number: 444 nmol/L (ref <=527)
Triglycerides by NMR: 190 mg/dL — ABNORMAL HIGH (ref 0–149)

## 2015-01-29 DIAGNOSIS — M109 Gout, unspecified: Secondary | ICD-10-CM | POA: Insufficient documentation

## 2015-01-31 ENCOUNTER — Telehealth: Payer: Self-pay | Admitting: Family Medicine

## 2015-01-31 DIAGNOSIS — E119 Type 2 diabetes mellitus without complications: Secondary | ICD-10-CM | POA: Diagnosis not present

## 2015-01-31 DIAGNOSIS — Z961 Presence of intraocular lens: Secondary | ICD-10-CM | POA: Diagnosis not present

## 2015-01-31 DIAGNOSIS — D3131 Benign neoplasm of right choroid: Secondary | ICD-10-CM | POA: Diagnosis not present

## 2015-01-31 NOTE — Telephone Encounter (Signed)
Advised patient of lab results  

## 2015-02-05 ENCOUNTER — Other Ambulatory Visit: Payer: Self-pay | Admitting: Family Medicine

## 2015-02-14 DIAGNOSIS — I129 Hypertensive chronic kidney disease with stage 1 through stage 4 chronic kidney disease, or unspecified chronic kidney disease: Secondary | ICD-10-CM | POA: Diagnosis not present

## 2015-02-14 DIAGNOSIS — E1122 Type 2 diabetes mellitus with diabetic chronic kidney disease: Secondary | ICD-10-CM | POA: Diagnosis not present

## 2015-02-14 DIAGNOSIS — N2581 Secondary hyperparathyroidism of renal origin: Secondary | ICD-10-CM | POA: Diagnosis not present

## 2015-02-14 DIAGNOSIS — N183 Chronic kidney disease, stage 3 (moderate): Secondary | ICD-10-CM | POA: Diagnosis not present

## 2015-02-14 DIAGNOSIS — M1A20X1 Drug-induced chronic gout, unspecified site, with tophus (tophi): Secondary | ICD-10-CM | POA: Diagnosis not present

## 2015-02-14 DIAGNOSIS — Z87448 Personal history of other diseases of urinary system: Secondary | ICD-10-CM | POA: Diagnosis not present

## 2015-02-21 ENCOUNTER — Ambulatory Visit (INDEPENDENT_AMBULATORY_CARE_PROVIDER_SITE_OTHER): Payer: Medicare Other | Admitting: Family Medicine

## 2015-02-21 ENCOUNTER — Encounter: Payer: Self-pay | Admitting: Family Medicine

## 2015-02-21 VITALS — BP 113/65 | HR 91 | Temp 97.0°F | Ht 74.0 in | Wt 283.4 lb

## 2015-02-21 DIAGNOSIS — E0822 Diabetes mellitus due to underlying condition with diabetic chronic kidney disease: Secondary | ICD-10-CM | POA: Diagnosis not present

## 2015-02-21 MED ORDER — INSULIN GLARGINE 100 UNIT/ML SOLOSTAR PEN: 30.0000 [IU] | PEN_INJECTOR | Freq: Every morning | SUBCUTANEOUS | Status: DC

## 2015-02-21 NOTE — Progress Notes (Signed)
Subjective:  Patient ID: Melvin Wang, male    DOB: March 22, 1941  Age: 74 y.o. MRN: [id number]  CC: Diabetes   HPI Melvin Wang presents for DM glucose readings. Didn't take Januvia. He recorded multiple blood sugar readings with the Lantus alone. He came in to discuss those because he is very concerned about taking Januvia so he has not started it yet. Of note is that his glucose has dropped into the low to mid 100s fasting and postprandial with the exception of some to 200+ in the evening and bedtime range. See attached log sheet. Patient determined that Januvia was not for him when he discovered it can cause pancreatic cancer and he has a family history positive for pancreas cancer.   History Melvin Wang has a past medical history of Movement disorder; Abnormality of gait; Hypertension; Diabetes mellitus without complication; Chronic kidney disease; Gout; and Dysrhythmia.   He has past surgical history that includes Cataract extraction w/PHACO (Left, 04/19/2013) and Cataract extraction w/PHACO (Right, 07/15/2013).   His family history includes Cancer in his paternal uncle.He reports that he quit smoking about 30 years ago. His smoking use included Cigarettes. He has a 15 pack-year smoking history. He has never used smokeless tobacco. He reports that he does not drink alcohol or use illicit drugs.  Current Outpatient Prescriptions on File Prior to Visit  Medication Sig Dispense Refill  . allopurinol (ZYLOPRIM) 300 MG tablet TAKE 1 TABLET EVERY DAY 90 tablet 1  . atenolol (TENORMIN) 50 MG tablet TAKE 1 TABLET (50 MG TOTAL) BY MOUTH DAILY. 90 tablet 1  . atorvastatin (LIPITOR) 40 MG tablet TAKE 1 TABLET BY MOUTH AT BEDTIME 90 tablet 0  . B-D UF III MINI PEN NEEDLES 31G X 5 MM MISC USE 1 TIME DAILY WITH LANTUS SOLOSTAR 100 each 1  . calcitRIOL (ROCALTROL) 0.5 MCG capsule 1 tablet daily    . furosemide (LASIX) 80 MG tablet TAKE 2 TABLETS (160 MG TOTAL) BY MOUTH 2 (TWO) TIMES DAILY. 360  tablet 0  . glucose blood (ONE TOUCH ULTRA TEST) test strip Use as instructed 100 each 3  . lisinopril (PRINIVIL,ZESTRIL) 20 MG tablet TAKE 1 TABLET BY MOUTH EVERY DAY (Patient taking differently: take 1/2 tablet daily) 30 tablet 1  . warfarin (COUMADIN) 10 MG tablet TAKE 1/2 TABLET DAILY ON MON,WED, AND FRIDAY AND ON TUES, THUR , SAT, AND SUN TAKE 1 TABLET DAILY 90 tablet 1   No current facility-administered medications on file prior to visit.    ROS Review of Systems  Constitutional: Negative for fever, chills, diaphoresis and unexpected weight change.  HENT: Negative for congestion, hearing loss, rhinorrhea, sore throat and trouble swallowing.   Respiratory: Negative for cough, chest tightness, shortness of breath and wheezing.   Gastrointestinal: Negative for abdominal pain and abdominal distention.  Endocrine: Negative for cold intolerance and heat intolerance.  Genitourinary: Negative for dysuria, hematuria and flank pain.    Objective:  BP 113/65 mmHg  Pulse 91  Temp(Src) 97 F (36.1 C) (Oral)  Ht 6\' 2"  (1.88 m)  Wt 283 lb 6.4 oz (128.549 kg)  BMI 36.37 kg/m2  BP Readings from Last 3 Encounters:  02/21/15 113/65  01/27/15 116/58  08/26/14 123/62    Wt Readings from Last 3 Encounters:  02/21/15 283 lb 6.4 oz (128.549 kg)  01/27/15 284 lb 9.6 oz (129.094 kg)  08/26/14 285 lb 9.6 oz (129.547 kg)     Physical Exam  Constitutional: He is oriented to person, place, and  time. He appears well-developed and well-nourished. No distress.  HENT:  Head: Normocephalic and atraumatic.  Eyes: Conjunctivae are normal. Pupils are equal, round, and reactive to light.  Neck: Normal range of motion. Neck supple. No thyromegaly present.  Cardiovascular: Normal rate, regular rhythm and normal heart sounds.   No murmur heard. Pulmonary/Chest: Effort normal and breath sounds normal. No respiratory distress. He has no wheezes. He has no rales.  Abdominal: Soft.  Lymphadenopathy:    He  has no cervical adenopathy.  Neurological: He is alert and oriented to person, place, and time.  Skin: Skin is warm and dry.  Psychiatric: He has a normal mood and affect. His behavior is normal. Judgment and thought content normal.    Lab Results  Component Value Date   HGBA1C 8.1% 01/27/2015   HGBA1C 7.3 % 08/26/2014   HGBA1C 8.3 02/21/2014    Lab Results  Component Value Date   WBC 11.6* 08/26/2014   HGB 12.3* 08/26/2014   HCT 37.1* 08/26/2014   PLT 255 02/19/2014   GLUCOSE 201* 01/27/2015   CHOL 126 01/27/2015   TRIG 190* 01/27/2015   HDL 38* 01/27/2015   LDLCALC 41 08/26/2014   ALT 23 01/27/2015   AST 19 01/27/2015   NA 136 01/27/2015   K 4.2 01/27/2015   CL 91* 01/27/2015   CREATININE 1.57* 01/27/2015   BUN 56* 01/27/2015   CO2 25 01/27/2015   TSH 1.030 08/26/2014   PSA 2.2 08/26/2014   INR 2.9 01/27/2015   HGBA1C 8.1% 01/27/2015    No results found.  Assessment & Plan:   Melvin Wang was seen today for diabetes.  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  I have discontinued Melvin Wang sitaGLIPtin. I have also changed his Insulin Glargine. Additionally, I am having him maintain his calcitRIOL, glucose blood, furosemide, B-D UF III MINI PEN NEEDLES, lisinopril, warfarin, atenolol, atorvastatin, and allopurinol.  Meds ordered this encounter  Medications  . Insulin Glargine (LANTUS SOLOSTAR) 100 UNIT/ML Solostar Pen    Sig: Inject 30 Units into the skin every morning.    Dispense:  5 pen    Refill:  PRN     Follow-up: Return in about 2 months (around 04/23/2015) for diabetes.  Claretta Fraise, M.D.

## 2015-03-13 ENCOUNTER — Ambulatory Visit (INDEPENDENT_AMBULATORY_CARE_PROVIDER_SITE_OTHER): Payer: Medicare Other | Admitting: Pharmacist

## 2015-03-13 DIAGNOSIS — I4891 Unspecified atrial fibrillation: Secondary | ICD-10-CM | POA: Diagnosis not present

## 2015-03-13 LAB — POCT INR: INR: 2

## 2015-03-13 MED ORDER — GLUCOSE BLOOD VI STRP: ORAL_STRIP | Status: DC

## 2015-03-13 NOTE — Progress Notes (Signed)
Patient injecting insulin for type 2 DM with recent dose increase.  He is checking 3-4 times a day.   THis is ok to continue - Rx given for test strips.

## 2015-03-13 NOTE — Patient Instructions (Signed)
Anticoagulation Dose Instructions as of 03/13/2015      Dorene Grebe Tue Wed Thu Fri Sat   New Dose 10 mg 5 mg 10 mg 5 mg 10 mg 5 mg 5 mg    Description        Continue current dose of 1 tablet sundays, tuesdays and thursdays, 1/2 tablet all other days.       INR was 2.0 today

## 2015-03-16 ENCOUNTER — Other Ambulatory Visit: Payer: Self-pay | Admitting: Family Medicine

## 2015-03-25 ENCOUNTER — Other Ambulatory Visit: Payer: Self-pay | Admitting: Family Medicine

## 2015-04-07 ENCOUNTER — Other Ambulatory Visit: Payer: Self-pay | Admitting: Family Medicine

## 2015-04-10 ENCOUNTER — Other Ambulatory Visit: Payer: Self-pay | Admitting: Family Medicine

## 2015-04-21 ENCOUNTER — Ambulatory Visit: Payer: Self-pay

## 2015-04-25 DIAGNOSIS — L84 Corns and callosities: Secondary | ICD-10-CM | POA: Diagnosis not present

## 2015-04-25 DIAGNOSIS — B351 Tinea unguium: Secondary | ICD-10-CM | POA: Diagnosis not present

## 2015-04-25 DIAGNOSIS — E1142 Type 2 diabetes mellitus with diabetic polyneuropathy: Secondary | ICD-10-CM | POA: Diagnosis not present

## 2015-05-05 ENCOUNTER — Encounter: Payer: Self-pay | Admitting: Pharmacist

## 2015-05-05 ENCOUNTER — Ambulatory Visit (INDEPENDENT_AMBULATORY_CARE_PROVIDER_SITE_OTHER): Payer: Medicare Other | Admitting: Pharmacist

## 2015-05-05 VITALS — BP 128/78 | HR 77 | Ht 74.0 in | Wt 278.0 lb

## 2015-05-05 DIAGNOSIS — I4891 Unspecified atrial fibrillation: Secondary | ICD-10-CM

## 2015-05-05 DIAGNOSIS — E1121 Type 2 diabetes mellitus with diabetic nephropathy: Secondary | ICD-10-CM | POA: Diagnosis not present

## 2015-05-05 DIAGNOSIS — I10 Essential (primary) hypertension: Secondary | ICD-10-CM | POA: Diagnosis not present

## 2015-05-05 DIAGNOSIS — Z0000 Encounter for general adult medical examination without abnormal findings: Secondary | ICD-10-CM

## 2015-05-05 DIAGNOSIS — N183 Chronic kidney disease, stage 3 (moderate): Secondary | ICD-10-CM

## 2015-05-05 DIAGNOSIS — Z1211 Encounter for screening for malignant neoplasm of colon: Secondary | ICD-10-CM

## 2015-05-05 DIAGNOSIS — E0822 Diabetes mellitus due to underlying condition with diabetic chronic kidney disease: Secondary | ICD-10-CM

## 2015-05-05 LAB — POCT GLYCOSYLATED HEMOGLOBIN (HGB A1C): Hemoglobin A1C: 7

## 2015-05-05 LAB — POCT INR: INR: 2.8

## 2015-05-05 MED ORDER — LISINOPRIL 20 MG PO TABS: 10.0000 mg | ORAL_TABLET | Freq: Every day | ORAL | Status: DC

## 2015-05-05 NOTE — Progress Notes (Addendum)
Patient ID: Melvin Wang, male   DOB: 08-12-1941, 74 y.o.   MRN: [id number]    Subjective:   Melvin Wang is a 74 y.o. male who presents for an Initial Medicare Annual Wellness Visit and recheck INR and type 2 DM.   Current warfarin dose is 10mg  take 1 tablet sundays, tuesdays and thursdays.  1/2 tablet all other days.    Patient has been working hard to decrease high CHO foods and has lost about 5# over the last 2 months.   Current Medications (verified) Outpatient Encounter Prescriptions as of 05/05/2015  Medication Sig  . allopurinol (ZYLOPRIM) 300 MG tablet TAKE 1 TABLET EVERY DAY  . atenolol (TENORMIN) 50 MG tablet TAKE 1 TABLET (50 MG TOTAL) BY MOUTH DAILY.  Marland Kitchen atorvastatin (LIPITOR) 40 MG tablet TAKE 1 TABLET BY MOUTH AT BEDTIME  . B-D UF III MINI PEN NEEDLES 31G X 5 MM MISC USE 1 TIME DAILY WITH LANTUS SOLOSTAR  . calcitRIOL (ROCALTROL) 0.5 MCG capsule take 1 capsule every other day  . furosemide (LASIX) 80 MG tablet TAKE 2 TABLETS (160 MG TOTAL) BY MOUTH 2 (TWO) TIMES DAILY.  Marland Kitchen glucose blood (ONE TOUCH ULTRA TEST) test strip Use to check BG up to QID.  Dx:  E11.9 - insulin treated type 2 DM  . Insulin Glargine (LANTUS SOLOSTAR) 100 UNIT/ML Solostar Pen Inject 30 Units into the skin every morning.  Marland Kitchen lisinopril (PRINIVIL,ZESTRIL) 20 MG tablet Take 0.5 tablets (10 mg total) by mouth daily.  Marland Kitchen warfarin (COUMADIN) 10 MG tablet Take as directed by anticoagulation dose - 1/2 to 1 tablet daily  . [DISCONTINUED] lisinopril (PRINIVIL,ZESTRIL) 20 MG tablet TAKE 1 TABLET BY MOUTH EVERY DAY (Patient taking differently: take 1/2 tablet daily)  . [DISCONTINUED] warfarin (COUMADIN) 10 MG tablet TAKE 1/2 TABLET DAILY ON MON,WED, AND FRIDAY AND ON TUES, THUR , SAT, AND SUN TAKE 1 TABLET DAILY   No facility-administered encounter medications on file as of 05/05/2015.    Allergies (verified) Acyclovir and related; Metformin and related; Amoxicillin; Augmentin; and Mavik   History: Past  Medical History  Diagnosis Date  . Movement disorder   . Abnormality of gait   . Hypertension   . Diabetes mellitus without complication   . Chronic Wang disease     renal insufficiency, sees nephrologist: Melvin Wang  . Gout   . Dysrhythmia     AFib  . Cataract   . Hyperlipidemia    Past Surgical History  Procedure Laterality Date  . Cataract extraction w/phaco Left 04/19/2013    Procedure: CATARACT EXTRACTION PHACO AND INTRAOCULAR LENS PLACEMENT (IOC);  Surgeon: Tonny Branch, MD;  Location: [facility];  Service: Ophthalmology;  Laterality: Left;  CDE 29.30  . Cataract extraction w/phaco Right 07/15/2013    Procedure: CATARACT EXTRACTION PHACO AND INTRAOCULAR LENS PLACEMENT (IOC);  Surgeon: Tonny Branch, MD;  Location: [facility];  Service: Ophthalmology;  Laterality: Right;  CDE:12.84   Family History  Problem Relation Age of Onset  . Cancer Paternal Uncle   . Cancer Father     rectal   Social History   Occupational History  . Not on file.   Social History Main Topics  . Smoking status: Former Smoker -- 1.00 packs/day for 15 years    Types: Cigarettes    Quit date: 07/07/1984  . Smokeless tobacco: Never Used  . Alcohol Use: No  . Drug Use: No  . Sexual Activity: Yes    Birth Control/ Protection: None  Do you feel safe at home?  Yes  exercise activities discussed: Current Exercise Habits:: Structured exercise class, Type of exercise: strength training/weights;treadmill, Time (Minutes): 30, Frequency (Times/Week): 4, Weekly Exercise (Minutes/Week): 120, Intensity: Moderate  Cardiac Risk Factors include: advanced age (>60men, >44 women);diabetes mellitus;dyslipidemia;hypertension;male gender;obesity (BMI >30kg/m2)  Objective:    Today's Vitals   05/05/15 1449  BP: 128/78  Pulse: 77  Height: 6\' 2"  (1.88 m)  Weight: 278 lb (126.1 kg)  PainSc: 2   PainLoc: Back   Body mass index is 35.68 kg/(m^2).  INR was 2.8 today in office A1c has decreased form 8.1% in Feb  2016 to 7.0% tpdau  Activities of Daily Living In your present state of health, do you have any difficulty performing the following activities: 05/05/2015  Hearing? N  Vision? N  Difficulty concentrating or making decisions? N  Walking or climbing stairs? Y  Dressing or bathing? N  Doing errands, shopping? N  Preparing Food and eating ? N  Using the Toilet? N  In the past six months, have you accidently leaked urine? N  Do you have problems with loss of bowel control? N  Managing your Medications? N  Managing your Finances? N  Housekeeping or managing your Housekeeping? N    Are there smokers in your home (other than you)? No    Depression Screen PHQ 2/9 Scores 05/05/2015 01/27/2015  PHQ - 2 Score 0 0    Fall Risk Fall Risk  05/05/2015 01/27/2015 08/26/2014 10/06/2013  Falls in the past year? No No No No  Risk for fall due to : - - - Impaired balance/gait    Cognitive Function: MMSE - Mini Mental State Exam 05/05/2015  Orientation to time 5  Orientation to Place 5  Registration 3  Attention/ Calculation 4  Recall 3  Language- name 2 objects 2  Language- repeat 1  Language- follow 3 step command 3  Language- read & follow direction 1  Write a sentence 1  Copy design 1  Total score 29    Immunizations and Health Maintenance Immunization History  Administered Date(s) Administered  . Influenza,inj,Quad PF,36+ Mos 09/14/2013, 09/20/2014  . Pneumococcal Conjugate-13 12/20/2014   Health Maintenance Due  Topic Date Due  . FOOT EXAM  04/27/1953  . OPHTHALMOLOGY EXAM  04/27/1953  . URINE MICROALBUMIN  04/27/1953  . TETANUS/TDAP  04/27/1962  . COLONOSCOPY  04/27/1993  . ZOSTAVAX  04/28/2003    Patient Care Team: Claretta Fraise, MD as PCP - General (Family Medicine) Steffanie Rainwater, DPM as Consulting Physician (Podiatry) Truc Manus Gunning, OD as Consulting Physician (Optometry) Curt Bears, MD as Consulting Physician (Oncology) Mauricia Area, MD as Consulting Physician  (Nephrology)  Indicate any recent Medical Services you may have received from other than [facility] providers in the past year (date may be approximate).    Assessment:    Annual Wellness Visit  Therapeutic anticoagulation Type 2 DM with nephrologic complications - controlled A. Fib Obesity - Weight decreased 5# over last 2 months   Screening Tests Health Maintenance  Topic Date Due  . FOOT EXAM  04/27/1953  . OPHTHALMOLOGY EXAM  04/27/1953  . URINE MICROALBUMIN  04/27/1953  . TETANUS/TDAP  04/27/1962  . COLONOSCOPY  04/27/1993  . ZOSTAVAX  04/28/2003  . INFLUENZA VACCINE  07/10/2015  . HEMOGLOBIN A1C  07/28/2015  . PNA vac Low Risk Adult (2 of 2 - PPSV23) 12/21/2015        Plan:   During the course of the visit Denym was  educated and counseled about the following appropriate screening and preventive services:   Vaccines to include Pneumoccal, Influenza, Hepatitis B, Td, Zostavax - patient declines Hep B, Zostavax and Tdap due to cost.  All other vaccines UTD  Colorectal cancer screening - patient declined colonoscopy until Oct or Nov 2016.  He did take FOBT to do at home and will return  Cardiovascular disease screening - LDL and BP at goal.   Diabetes - improved control with increase in insulin and better dietary habits.  Patient saw Dr Irving Shows earlier this month for foot exam - request copy of visit from his office.  Patient given cup to check urine microalbumin/Cr Ratio.  He was unable to void in office today.  Glaucoma screening / Diabetic Eye Exam - UTD per patient - done 01/2015.  Requested copy of last visit from Dr Rona Ravens at [email or web address]  Nutrition counseling - continue to limit CHO and caloric intake.  Continue with daily exercise with Mali Applegate - PT  Prostate cancer screening - UTD  Anticoagulation Dose Instructions as of 05/05/2015      Dorene Grebe Tue Wed Thu Fri Sat   New Dose 10 mg 5 mg 10 mg 5 mg 10 mg 5 mg 5 mg    Description        Continue current dose  of 1 tablet sundays, tuesdays and thursdays, 1/2 tablet all other days.       Recheck INR in 6 weeks    Patient Instructions (the written plan) were given to the patient.   Cherre Robins, [facility]   05/05/2015        I have reviewed and agree with the above AWV documentation.  Claretta Fraise, M.D.

## 2015-05-05 NOTE — Patient Instructions (Addendum)
Melvin Wang , Thank you for taking time to come for your Medicare Wellness Visit. I appreciate your ongoing commitment to your health goals. Please review the following plan we discussed and let me know if I can assist you in the future.    This is a list of the screening recommended for you and due dates:  Health Maintenance  Topic Date Due  . Urine Protein Check  06/22/2015*  . Shingles Vaccine  06/22/2015*  . Tetanus Vaccine  06/22/2015*  . Colon Cancer Screening  09/08/2015*  . Flu Shot  07/10/2015  . Hemoglobin A1C  07/28/2015  . Pneumonia vaccines (2 of 2 - PPSV23) 12/21/2015  . Eye exam for diabetics  01/24/2016  . Complete foot exam   04/12/2016  *Topic was postponed. The date shown is not the original due date.    Anticoagulation Dose Instructions as of 05/05/2015      Dorene Grebe Tue Wed Thu Fri Sat   New Dose 10 mg 5 mg 10 mg 5 mg 10 mg 5 mg 5 mg    Description        Continue current dose of 1 tablet sundays, tuesdays and thursdays, 1/2 tablet all other days.       Preventive Care for Adults A healthy lifestyle and preventive care can promote health and wellness. Preventive health guidelines for men include the following key practices:  A routine yearly physical is a good way to check with your health care provider about your health and preventative screening. It is a chance to share any concerns and updates on your health and to receive a thorough exam.  Visit your dentist for a routine exam and preventative care every 6 months. Brush your teeth twice a day and floss once a day. Good oral hygiene prevents tooth decay and gum disease.  The frequency of eye exams is based on your age, health, family medical history, use of contact lenses, and other factors. Follow your health care provider's recommendations for frequency of eye exams.  Eat a healthy diet. Foods such as vegetables, fruits, whole grains, low-fat dairy products, and lean protein foods contain the nutrients  you need without too many calories. Decrease your intake of foods high in solid fats, added sugars, and salt. Eat the right amount of calories for you.Get information about a proper diet from your health care provider, if necessary.  Regular physical exercise is one of the most important things you can do for your health. Most adults should get at least 150 minutes of moderate-intensity exercise (any activity that increases your heart rate and causes you to sweat) each week. In addition, most adults need muscle-strengthening exercises on 2 or more days a week.  Maintain a healthy weight. The body mass index (BMI) is a screening tool to identify possible weight problems. It provides an estimate of body fat based on height and weight. Your health care provider can find your BMI and can help you achieve or maintain a healthy weight.For adults 20 years and older:  A BMI below 18.5 is considered underweight.  A BMI of 18.5 to 24.9 is normal.  A BMI of 25 to 29.9 is considered overweight.  A BMI of 30 and above is considered obese.  Maintain normal blood lipids and cholesterol levels by exercising and minimizing your intake of saturated fat. Eat a balanced diet with plenty of fruit and vegetables. Blood tests for lipids and cholesterol should begin at age 65 and be repeated every 5 years.  If your lipid or cholesterol levels are high, you are over 50, or you are at high risk for heart disease, you may need your cholesterol levels checked more frequently.Ongoing high lipid and cholesterol levels should be treated with medicines if diet and exercise are not working.  If you smoke, find out from your health care provider how to quit. If you do not use tobacco, do not start.  Lung cancer screening is recommended for adults aged 42-80 years who are at high risk for developing lung cancer because of a history of smoking. A yearly low-dose CT scan of the lungs is recommended for people who have at least a  30-pack-year history of smoking and are a current smoker or have quit within the past 15 years. A pack year of smoking is smoking an average of 1 pack of cigarettes a day for 1 year (for example: 1 pack a day for 30 years or 2 packs a day for 15 years). Yearly screening should continue until the smoker has stopped smoking for at least 15 years. Yearly screening should be stopped for people who develop a health problem that would prevent them from having lung cancer treatment.  If you choose to drink alcohol, do not have more than 2 drinks per day. One drink is considered to be 12 ounces (355 mL) of beer, 5 ounces (148 mL) of wine, or 1.5 ounces (44 mL) of liquor.  Avoid use of street drugs. Do not share needles with anyone. Ask for help if you need support or instructions about stopping the use of drugs.  High blood pressure causes heart disease and increases the risk of stroke. Your blood pressure should be checked at least every 1-2 years. Ongoing high blood pressure should be treated with medicines, if weight loss and exercise are not effective.  If you are 53-80 years old, ask your health care provider if you should take aspirin to prevent heart disease.  Diabetes screening involves taking a blood sample to check your fasting blood sugar level. This should be done once every 3 years, after age 104, if you are within normal weight and without risk factors for diabetes. Testing should be considered at a younger age or be carried out more frequently if you are overweight and have at least 1 risk factor for diabetes.  Colorectal cancer can be detected and often prevented. Most routine colorectal cancer screening begins at the age of 81 and continues through age 91. However, your health care provider may recommend screening at an earlier age if you have risk factors for colon cancer. On a yearly basis, your health care provider may provide home test kits to check for hidden blood in the stool. Use of a  small camera at the end of a tube to directly examine the colon (sigmoidoscopy or colonoscopy) can detect the earliest forms of colorectal cancer. Talk to your health care provider about this at age 81, when routine screening begins. Direct exam of the colon should be repeated every 5-10 years through age 5, unless early forms of precancerous polyps or small growths are found.  People who are at an increased risk for hepatitis B should be screened for this virus. You are considered at high risk for hepatitis B if:  You were born in a country where hepatitis B occurs often. Talk with your health care provider about which countries are considered high risk.  Your parents were born in a high-risk country and you have not received a shot  to protect against hepatitis B (hepatitis B vaccine).  You have HIV or AIDS.  You use needles to inject street drugs.  You live with, or have sex with, someone who has hepatitis B.  You are a man who has sex with other men (MSM).  You get hemodialysis treatment.  You take certain medicines for conditions such as cancer, organ transplantation, and autoimmune conditions.  Hepatitis C blood testing is recommended for all people born from [birth year] through 1965 and any individual with known risks for hepatitis C.  Practice safe sex. Use condoms and avoid high-risk sexual practices to reduce the spread of sexually transmitted infections (STIs). STIs include gonorrhea, chlamydia, syphilis, trichomonas, herpes, HPV, and human immunodeficiency virus (HIV). Herpes, HIV, and HPV are viral illnesses that have no cure. They can result in disability, cancer, and death.  If you are at risk of being infected with HIV, it is recommended that you take a prescription medicine daily to prevent HIV infection. This is called preexposure prophylaxis (PrEP). You are considered at risk if:  You are a man who has sex with other men (MSM) and have other risk factors.  You are a  heterosexual man, are sexually active, and are at increased risk for HIV infection.  You take drugs by injection.  You are sexually active with a partner who has HIV.  Talk with your health care provider about whether you are at high risk of being infected with HIV. If you choose to begin PrEP, you should first be tested for HIV. You should then be tested every 3 months for as long as you are taking PrEP.  A one-time screening for abdominal aortic aneurysm (AAA) and surgical repair of large AAAs by ultrasound are recommended for men ages 56 to 70 years who are current or former smokers.  Healthy men should no longer receive prostate-specific antigen (PSA) blood tests as part of routine cancer screening. Talk with your health care provider about prostate cancer screening.  Testicular cancer screening is not recommended for adult males who have no symptoms. Screening includes self-exam, a health care provider exam, and other screening tests. Consult with your health care provider about any symptoms you have or any concerns you have about testicular cancer.  Use sunscreen. Apply sunscreen liberally and repeatedly throughout the day. You should seek shade when your shadow is shorter than you. Protect yourself by wearing long sleeves, pants, a wide-brimmed hat, and sunglasses year round, whenever you are outdoors.  Once a month, do a whole-body skin exam, using a mirror to look at the skin on your back. Tell your health care provider about new moles, moles that have irregular borders, moles that are larger than a pencil eraser, or moles that have changed in shape or color.  Stay current with required vaccines (immunizations).  Influenza vaccine. All adults should be immunized every year.  Tetanus, diphtheria, and acellular pertussis (Td, Tdap) vaccine. An adult who has not previously received Tdap or who does not know his vaccine status should receive 1 dose of Tdap. This initial dose should be  followed by tetanus and diphtheria toxoids (Td) booster doses every 10 years. Adults with an unknown or incomplete history of completing a 3-dose immunization series with Td-containing vaccines should begin or complete a primary immunization series including a Tdap dose. Adults should receive a Td booster every 10 years.  Varicella vaccine. An adult without evidence of immunity to varicella should receive 2 doses or a second dose if he has  previously received 1 dose.  Human papillomavirus (HPV) vaccine. Males aged 40-21 years who have not received the vaccine previously should receive the 3-dose series. Males aged 22-26 years may be immunized. Immunization is recommended through the age of 38 years for any male who has sex with males and did not get any or all doses earlier. Immunization is recommended for any person with an immunocompromised condition through the age of 38 years if he did not get any or all doses earlier. During the 3-dose series, the second dose should be obtained 4-8 weeks after the first dose. The third dose should be obtained 24 weeks after the first dose and 16 weeks after the second dose.  Zoster vaccine. One dose is recommended for adults aged 42 years or older unless certain conditions are present.  Measles, mumps, and rubella (MMR) vaccine. Adults born before [birth year] generally are considered immune to measles and mumps. Adults born in [birth year] or later should have 1 or more doses of MMR vaccine unless there is a contraindication to the vaccine or there is laboratory evidence of immunity to each of the three diseases. A routine second dose of MMR vaccine should be obtained at least 28 days after the first dose for students attending postsecondary schools, health care workers, or international travelers. People who received inactivated measles vaccine or an unknown type of measles vaccine during 1963-1967 should receive 2 doses of MMR vaccine. People who received inactivated mumps vaccine  or an unknown type of mumps vaccine before 1979 and are at high risk for mumps infection should consider immunization with 2 doses of MMR vaccine. Unvaccinated health care workers born before [birth year] who lack laboratory evidence of measles, mumps, or rubella immunity or laboratory confirmation of disease should consider measles and mumps immunization with 2 doses of MMR vaccine or rubella immunization with 1 dose of MMR vaccine.  Pneumococcal 13-valent conjugate (PCV13) vaccine. When indicated, a person who is uncertain of his immunization history and has no record of immunization should receive the PCV13 vaccine. An adult aged 93 years or older who has certain medical conditions and has not been previously immunized should receive 1 dose of PCV13 vaccine. This PCV13 should be followed with a dose of pneumococcal polysaccharide (PPSV23) vaccine. The PPSV23 vaccine dose should be obtained at least 8 weeks after the dose of PCV13 vaccine. An adult aged 39 years or older who has certain medical conditions and previously received 1 or more doses of PPSV23 vaccine should receive 1 dose of PCV13. The PCV13 vaccine dose should be obtained 1 or more years after the last PPSV23 vaccine dose.  Pneumococcal polysaccharide (PPSV23) vaccine. When PCV13 is also indicated, PCV13 should be obtained first. All adults aged 80 years and older should be immunized. An adult younger than age 81 years who has certain medical conditions should be immunized. Any person who resides in a nursing home or long-term care facility should be immunized. An adult smoker should be immunized. People with an immunocompromised condition and certain other conditions should receive both PCV13 and PPSV23 vaccines. People with human immunodeficiency virus (HIV) infection should be immunized as soon as possible after diagnosis. Immunization during chemotherapy or radiation therapy should be avoided. Routine use of PPSV23 vaccine is not recommended for  American Indians, [place] Natives, or people younger than 65 years unless there are medical conditions that require PPSV23 vaccine. When indicated, people who have unknown immunization and have no record of immunization should receive PPSV23 vaccine. One-time revaccination 5 years  after the first dose of PPSV23 is recommended for people aged 19-64 years who have chronic kidney failure, nephrotic syndrome, asplenia, or immunocompromised conditions. People who received 1-2 doses of PPSV23 before age 87 years should receive another dose of PPSV23 vaccine at age 28 years or later if at least 5 years have passed since the previous dose. Doses of PPSV23 are not needed for people immunized with PPSV23 at or after age 94 years.  Meningococcal vaccine. Adults with asplenia or persistent complement component deficiencies should receive 2 doses of quadrivalent meningococcal conjugate (MenACWY-D) vaccine. The doses should be obtained at least 2 months apart. Microbiologists working with certain meningococcal bacteria, [employer] recruits, people at risk during an outbreak, and people who travel to or live in countries with a high rate of meningitis should be immunized. A first-year college student up through age 69 years who is living in a residence hall should receive a dose if he did not receive a dose on or after his 16th birthday. Adults who have certain high-risk conditions should receive one or more doses of vaccine.  Hepatitis A vaccine. Adults who wish to be protected from this disease, have certain high-risk conditions, work with hepatitis A-infected animals, work in hepatitis A research labs, or travel to or work in countries with a high rate of hepatitis A should be immunized. Adults who were previously unvaccinated and who anticipate close contact with an international adoptee during the first 60 days after arrival in the [place] from a country with a high rate of hepatitis A should be immunized.  Hepatitis  B vaccine. Adults should be immunized if they wish to be protected from this disease, have certain high-risk conditions, may be exposed to blood or other infectious body fluids, are household contacts or sex partners of hepatitis B positive people, are clients or workers in certain care facilities, or travel to or work in countries with a high rate of hepatitis B.  Haemophilus influenzae type b (Hib) vaccine. A previously unvaccinated person with asplenia or sickle cell disease or having a scheduled splenectomy should receive 1 dose of Hib vaccine. Regardless of previous immunization, a recipient of a hematopoietic stem cell transplant should receive a 3-dose series 6-12 months after his successful transplant. Hib vaccine is not recommended for adults with HIV infection. Preventive Service / Frequency Ages 101 to 74  Blood pressure check.** / Every 1 to 2 years.  Lipid and cholesterol check.** / Every 5 years beginning at age 77.  Hepatitis C blood test.** / For any individual with known risks for hepatitis C.  Skin self-exam. / Monthly.  Influenza vaccine. / Every year.  Tetanus, diphtheria, and acellular pertussis (Tdap, Td) vaccine.** / Consult your health care provider. 1 dose of Td every 10 years.  Varicella vaccine.** / Consult your health care provider.  HPV vaccine. / 3 doses over 6 months, if 33 or younger.  Measles, mumps, rubella (MMR) vaccine.** / You need at least 1 dose of MMR if you were born in 1957 or later. You may also need a second dose.  Pneumococcal 13-valent conjugate (PCV13) vaccine.** / Consult your health care provider.  Pneumococcal polysaccharide (PPSV23) vaccine.** / 1 to 2 doses if you smoke cigarettes or if you have certain conditions.  Meningococcal vaccine.** / 1 dose if you are age 60 to 62 years and a Market researcher living in a residence hall, or have one of several medical conditions. You may also need additional booster doses.  Hepatitis A  vaccine.** / Consult your health care provider.  Hepatitis B vaccine.** / Consult your health care provider.  Haemophilus influenzae type b (Hib) vaccine.** / Consult your health care provider. Ages 76 to 76  Blood pressure check.** / Every 1 to 2 years.  Lipid and cholesterol check.** / Every 5 years beginning at age 5.  Lung cancer screening. / Every year if you are aged 68-80 years and have a 30-pack-year history of smoking and currently smoke or have quit within the past 15 years. Yearly screening is stopped once you have quit smoking for at least 15 years or develop a health problem that would prevent you from having lung cancer treatment.  Fecal occult blood test (FOBT) of stool. / Every year beginning at age 40 and continuing until age 49. You may not have to do this test if you get a colonoscopy every 10 years.  Flexible sigmoidoscopy** or colonoscopy.** / Every 5 years for a flexible sigmoidoscopy or every 10 years for a colonoscopy beginning at age 12 and continuing until age 26.  Hepatitis C blood test.** / For all people born from [birth year] through 1965 and any individual with known risks for hepatitis C.  Skin self-exam. / Monthly.  Influenza vaccine. / Every year.  Tetanus, diphtheria, and acellular pertussis (Tdap/Td) vaccine.** / Consult your health care provider. 1 dose of Td every 10 years.  Varicella vaccine.** / Consult your health care provider.  Zoster vaccine.** / 1 dose for adults aged 78 years or older.  Measles, mumps, rubella (MMR) vaccine.** / You need at least 1 dose of MMR if you were born in 1955 or later. You may also need a second dose.  Pneumococcal 13-valent conjugate (PCV13) vaccine.** / Consult your health care provider.  Pneumococcal polysaccharide (PPSV23) vaccine.** / 1 to 2 doses if you smoke cigarettes or if you have certain conditions.  Meningococcal vaccine.** / Consult your health care provider.  Hepatitis A vaccine.** / Consult your  health care provider.  Hepatitis B vaccine.** / Consult your health care provider.  Haemophilus influenzae type b (Hib) vaccine.** / Consult your health care provider. Ages 56 and over  Blood pressure check.** / Every 1 to 2 years.  Lipid and cholesterol check.**/ Every 5 years beginning at age 87.  Lung cancer screening. / Every year if you are aged 70-80 years and have a 30-pack-year history of smoking and currently smoke or have quit within the past 15 years. Yearly screening is stopped once you have quit smoking for at least 15 years or develop a health problem that would prevent you from having lung cancer treatment.  Fecal occult blood test (FOBT) of stool. / Every year beginning at age 25 and continuing until age 60. You may not have to do this test if you get a colonoscopy every 10 years.  Flexible sigmoidoscopy** or colonoscopy.** / Every 5 years for a flexible sigmoidoscopy or every 10 years for a colonoscopy beginning at age 32 and continuing until age 24.  Hepatitis C blood test.** / For all people born from [birth year] through 1963 and any individual with known risks for hepatitis C.  Abdominal aortic aneurysm (AAA) screening.** / A one-time screening for ages 78 to 10 years who are current or former smokers.  Skin self-exam. / Monthly.  Influenza vaccine. / Every year.  Tetanus, diphtheria, and acellular pertussis (Tdap/Td) vaccine.** / 1 dose of Td every 10 years.  Varicella vaccine.** / Consult your health care provider.  Zoster vaccine.** / 1 dose  for adults aged 85 years or older.  Pneumococcal 13-valent conjugate (PCV13) vaccine.** / Consult your health care provider.  Pneumococcal polysaccharide (PPSV23) vaccine.** / 1 dose for all adults aged 41 years and older.  Meningococcal vaccine.** / Consult your health care provider.  Hepatitis A vaccine.** / Consult your health care provider.  Hepatitis B vaccine.** / Consult your health care provider.  Haemophilus  influenzae type b (Hib) vaccine.** / Consult your health care provider. **Family history and personal history of risk and conditions may change your health care provider's recommendations. Document Released: 01/21/2002 Document Revised: 11/30/2013 Document Reviewed: 04/22/2011 [facility] Patient Information 2015 [place], [place]. This information is not intended to replace advice given to you by your health care provider. Make sure you discuss any questions you have with your health care provider.

## 2015-05-06 LAB — BMP8+EGFR
BUN/Creatinine Ratio: 37 — ABNORMAL HIGH (ref 10–22)
BUN: 49 mg/dL — ABNORMAL HIGH (ref 8–27)
CO2: 25 mmol/L (ref 18–29)
Calcium: 9.1 mg/dL (ref 8.6–10.2)
Chloride: 93 mmol/L — ABNORMAL LOW (ref 97–108)
Creatinine, Ser: 1.33 mg/dL — ABNORMAL HIGH (ref 0.76–1.27)
GFR, EST AFRICAN AMERICAN: 61 mL/min/{1.73_m2} (ref >59)
GFR, EST NON AFRICAN AMERICAN: 53 mL/min/{1.73_m2} — AB (ref >59)
GLUCOSE: 137 mg/dL — AB (ref 65–99)
POTASSIUM: 4.1 mmol/L (ref 3.5–5.2)
SODIUM: 137 mmol/L (ref 134–144)

## 2015-05-17 ENCOUNTER — Other Ambulatory Visit: Payer: Self-pay | Admitting: Family Medicine

## 2015-06-09 ENCOUNTER — Other Ambulatory Visit: Payer: Self-pay | Admitting: Family Medicine

## 2015-06-15 ENCOUNTER — Other Ambulatory Visit: Payer: Self-pay

## 2015-06-15 MED ORDER — FUROSEMIDE 80 MG PO TABS: ORAL_TABLET | ORAL | Status: DC

## 2015-06-22 ENCOUNTER — Encounter: Payer: Self-pay | Admitting: Family Medicine

## 2015-06-22 ENCOUNTER — Ambulatory Visit (INDEPENDENT_AMBULATORY_CARE_PROVIDER_SITE_OTHER): Payer: Medicare Other | Admitting: Family Medicine

## 2015-06-22 VITALS — BP 123/67 | HR 98 | Temp 97.0°F | Ht 74.0 in | Wt 279.6 lb

## 2015-06-22 DIAGNOSIS — I10 Essential (primary) hypertension: Secondary | ICD-10-CM

## 2015-06-22 DIAGNOSIS — I4891 Unspecified atrial fibrillation: Secondary | ICD-10-CM | POA: Diagnosis not present

## 2015-06-22 DIAGNOSIS — E785 Hyperlipidemia, unspecified: Secondary | ICD-10-CM | POA: Diagnosis not present

## 2015-06-22 DIAGNOSIS — E1121 Type 2 diabetes mellitus with diabetic nephropathy: Secondary | ICD-10-CM | POA: Diagnosis not present

## 2015-06-22 LAB — POCT CBC
Granulocyte percent: 75.6 %G (ref 37–80)
HCT, POC: 40 % — AB (ref 43.5–53.7)
HEMOGLOBIN: 12.6 g/dL — AB (ref 14.1–18.1)
LYMPH, POC: 2.2 (ref 0.6–3.4)
MCH, POC: 30.4 pg (ref 27–31.2)
MCHC: 31.4 g/dL — AB (ref 31.8–35.4)
MCV: 96.6 fL (ref 80–97)
MPV: 7.3 fL (ref 0–99.8)
PLATELET COUNT, POC: 180 10*3/uL (ref 142–424)
POC GRANULOCYTE: 9.4 — AB (ref 2–6.9)
POC LYMPH PERCENT: 17.7 %L (ref 10–50)
RBC: 4.14 M/uL — AB (ref 4.69–6.13)
RDW, POC: 16.8 %
WBC: 12.4 10*3/uL — AB (ref 4.6–10.2)

## 2015-06-22 NOTE — Progress Notes (Signed)
Subjective:  Patient ID: Melvin Wang, male    DOB: [birth year]  Age: 74 y.o. MRN: [id number]  CC: Diabetes; Hyperlipidemia; Hypertension; and Atrial Fibrillation   HPI Melvin Wang presents for  follow-up of hypertension. Patient has no history of headache chest pain or shortness of breath or recent cough. Patient also denies symptoms of TIA such as numbness weakness lateralizing. Patient checks  blood pressure at home and has not had any elevated readings recently. Patient denies side effects from his medication. States taking it regularly.  Patient also  in for follow-up of elevated cholesterol. Doing well without complaints on current medication. Denies side effects of statin including myalgia and arthralgia and nausea. Also in today for liver function testing. Currently no chest pain, shortness of breath or other cardiovascular related symptoms noted.  Follow-up of diabetes. Patient does check blood sugar at home. Readings run between 110 and 170 Patient denies symptoms such as polyuria, polydipsia, excessive hunger, nausea No significant hypoglycemic spells noted. No swelling from his nephropathy noted. He is also due for basic metabolic to evaluate kidney function today Medications as noted below. Taking them regularly without complication/adverse reaction being reported today.    Patient in for follow-up of atrial fibrillation. Patient denies any recent bouts of chest pain or palpitations. Additionally, patient is taking anticoagulants. Patient denies any recent excessive bleeding episodes including epistaxis, bleeding from the gums, genitalia, rectal bleeding or hematuria. Additionally there has been no excessive bruising.   History Melvin Wang has a past medical history of Movement disorder; Abnormality of gait; Hypertension; Diabetes mellitus without complication; Chronic kidney disease; Gout; Dysrhythmia; Cataract; Hyperlipidemia; and Foot ulcer, right (03/2013).   He has  past surgical history that includes Cataract extraction w/PHACO (Left, 04/19/2013) and Cataract extraction w/PHACO (Right, 07/15/2013).   His family history includes Cancer in his father and paternal uncle.He reports that he quit smoking about 31 years ago. His smoking use included Cigarettes. He has a 15 pack-year smoking history. He has never used smokeless tobacco. He reports that he does not drink alcohol or use illicit drugs.  Current Outpatient Prescriptions on File Prior to Visit  Medication Sig Dispense Refill  . allopurinol (ZYLOPRIM) 300 MG tablet TAKE 1 TABLET EVERY DAY 90 tablet 1  . atenolol (TENORMIN) 50 MG tablet TAKE 1 TABLET (50 MG TOTAL) BY MOUTH DAILY. 90 tablet 1  . atorvastatin (LIPITOR) 40 MG tablet TAKE 1 TABLET BY MOUTH AT BEDTIME 90 tablet 0  . B-D UF III MINI PEN NEEDLES 31G X 5 MM MISC USE 1 TIME DAILY WITH LANTUS SOLOSTAR 100 each 1  . calcitRIOL (ROCALTROL) 0.5 MCG capsule take 1 capsule every other day    . furosemide (LASIX) 80 MG tablet TAKE 2 TABLETS (160 MG TOTAL) BY MOUTH 2 (TWO) TIMES DAILY. 360 tablet 0  . Insulin Glargine (LANTUS SOLOSTAR) 100 UNIT/ML Solostar Pen Inject 30 Units into the skin every morning. 5 pen PRN  . lisinopril (PRINIVIL,ZESTRIL) 20 MG tablet Take 0.5 tablets (10 mg total) by mouth daily. 45 tablet 1  . ONE TOUCH ULTRA TEST test strip CHECH BLOOD SUGAR TWICE A DAY 100 each 1  . warfarin (COUMADIN) 10 MG tablet Take as directed by anticoagulation dose - 1/2 to 1 tablet daily 90 tablet 1   No current facility-administered medications on file prior to visit.    ROS Review of Systems  Constitutional: Negative for fever, chills and diaphoresis.  HENT: Negative for congestion, rhinorrhea and sore throat.   Respiratory:  Negative for cough, shortness of breath and wheezing.   Cardiovascular: Negative for chest pain.  Gastrointestinal: Negative for nausea, vomiting, abdominal pain, diarrhea, constipation and abdominal distention.    Genitourinary: Negative for dysuria and frequency.  Musculoskeletal: Negative for joint swelling and arthralgias.  Skin: Negative for rash.  Neurological: Negative for headaches.    Objective:  BP 123/67 mmHg  Pulse 98  Temp(Src) 97 F (36.1 C) (Oral)  Ht _0  (1.88 m)  Wt 279 lb 9.6 oz (126.826 kg)  BMI 35.88 kg/m2  BP Readings from Last 3 Encounters:  06/22/15 123/67  05/05/15 128/78  02/21/15 113/65    Wt Readings from Last 3 Encounters:  06/22/15 279 lb 9.6 oz (126.826 kg)  05/05/15 278 lb (126.1 kg)  02/21/15 283 lb 6.4 oz (128.549 kg)     Physical Exam  Constitutional: He is oriented to person, place, and time. He appears well-developed and well-nourished. No distress.  HENT:  Head: Normocephalic and atraumatic.  Right Ear: External ear normal.  Left Ear: External ear normal.  Nose: Nose normal.  Mouth/Throat: Oropharynx is clear and moist.  Eyes: Conjunctivae and EOM are normal. Pupils are equal, round, and reactive to light.  Neck: Normal range of motion. Neck supple. No thyromegaly present.  Cardiovascular: Normal rate, regular rhythm and normal heart sounds.   No murmur heard. Pulmonary/Chest: Effort normal and breath sounds normal. No respiratory distress. He has no wheezes. He has no rales.  Abdominal: Soft. Bowel sounds are normal. He exhibits no distension. There is no tenderness.  Lymphadenopathy:    He has no cervical adenopathy.  Neurological: He is alert and oriented to person, place, and time. He has normal reflexes.  Skin: Skin is warm and dry.  Psychiatric: He has a normal mood and affect. His behavior is normal. Judgment and thought content normal.    Lab Results  Component Value Date   HGBA1C 7.0 05/05/2015   HGBA1C 8.1 01/27/2015   HGBA1C 7.3 % 08/26/2014    Lab Results  Component Value Date   WBC 12.4* 06/22/2015   HGB 12.6* 06/22/2015   HCT 40.0* 06/22/2015   PLT 255 02/19/2014   GLUCOSE 155* 06/22/2015   CHOL 91* 06/22/2015    TRIG 104 06/22/2015   HDL 33* 06/22/2015   LDLCALC 37 06/22/2015   ALT 24 06/22/2015   AST 16 06/22/2015   NA 138 06/22/2015   K 4.2 06/22/2015   CL 93* 06/22/2015   CREATININE 1.13 06/22/2015   BUN 33* 06/22/2015   CO2 23 06/22/2015   TSH 1.030 08/26/2014   PSA 2.2 08/26/2014   INR 2.8 05/05/2015   HGBA1C 7.0 05/05/2015    No results found.  Assessment & Plan:   Jessie was seen today for diabetes, hyperlipidemia, hypertension and atrial fibrillation.  Diagnoses and all orders for this visit:  Controlled type 2 diabetes mellitus with diabetic nephropathy Orders: -     POCT CBC -     Cancel: POCT glycosylated hemoglobin (Hb A1C) -     CMP14+EGFR -     Lipid panel  Essential hypertension Orders: -     POCT CBC -     Cancel: POCT glycosylated hemoglobin (Hb A1C) -     CMP14+EGFR -     Lipid panel  Atrial fibrillation, unspecified Orders: -     POCT CBC -     Cancel: POCT glycosylated hemoglobin (Hb A1C) -     CMP14+EGFR  Hyperlipemia Orders: -  CMP14+EGFR -     Lipid panel   I am having Mr. Humiston maintain his calcitRIOL, B-D UF III MINI PEN NEEDLES, allopurinol, Insulin Glargine, atenolol, atorvastatin, lisinopril, warfarin, ONE TOUCH ULTRA TEST, and furosemide.  No orders of the defined types were placed in this encounter.     Follow-up: Return in about 3 months (around 09/22/2015).  Claretta Fraise, M.D.

## 2015-06-22 NOTE — Patient Instructions (Signed)
Hypoglycemia °Hypoglycemia occurs when the glucose in your blood is too low. Glucose is a type of sugar that is your body's main energy source. Hormones, such as insulin and glucagon, control the level of glucose in the blood. Insulin lowers blood glucose and glucagon increases blood glucose. Having too much insulin in your blood stream, or not eating enough food containing sugar, can result in hypoglycemia. Hypoglycemia can happen to people with or without diabetes. It can develop quickly and can be a medical emergency.  °CAUSES  °· Missing or delaying meals. °· Not eating enough carbohydrates at meals. °· Taking too much diabetes medicine. °· Not timing your oral diabetes medicine or insulin doses with meals, snacks, and exercise. °· Nausea and vomiting. °· Certain medicines. °· Severe illnesses, such as hepatitis, kidney disorders, and certain eating disorders. °· Increased activity or exercise without eating something extra or adjusting medicines. °· Drinking too much alcohol. °· A nerve disorder that affects body functions like your heart rate, blood pressure, and digestion (autonomic neuropathy). °· A condition where the stomach muscles do not function properly (gastroparesis). Therefore, medicines and food may not absorb properly. °· Rarely, a tumor of the pancreas can produce too much insulin. °SYMPTOMS  °· Hunger. °· Sweating (diaphoresis). °· Change in body temperature. °· Shakiness. °· Headache. °· Anxiety. °· Lightheadedness. °· Irritability. °· Difficulty concentrating. °· Dry mouth. °· Tingling or numbness in the hands or feet. °· Restless sleep or sleep disturbances. °· Altered speech and coordination. °· Change in mental status. °· Seizures or prolonged convulsions. °· Combativeness. °· Drowsiness (lethargic). °· Weakness. °· Increased heart rate or palpitations. °· Confusion. °· Pale, gray skin color. °· Blurred or double vision. °· Fainting. °DIAGNOSIS  °A physical exam and medical history will be  performed. Your caregiver may make a diagnosis based on your symptoms. Blood tests and other lab tests may be performed to confirm a diagnosis. Once the diagnosis is made, your caregiver will see if your signs and symptoms go away once your blood glucose is raised.  °TREATMENT  °Usually, you can easily treat your hypoglycemia when you notice symptoms. °· Check your blood glucose. If it is less than 70 mg/dl, take one of the following:   °¨ 3-4 glucose tablets.   °¨ ½ cup juice.   °¨ ½ cup regular soda.   °¨ 1 cup skim milk.   °¨ ½-1 tube of glucose gel.   °¨ 5-6 hard candies.   °· Avoid high-fat drinks or food that may delay a rise in blood glucose levels. °· Do not take more than the recommended amount of sugary foods, drinks, gel, or tablets. Doing so will cause your blood glucose to go too high.   °· Wait 10-15 minutes and recheck your blood glucose. If it is still less than 70 mg/dl or below your target range, repeat treatment.   °· Eat a snack if it is more than 1 hour until your next meal.   °There may be a time when your blood glucose may go so low that you are unable to treat yourself at home when you start to notice symptoms. You may need someone to help you. You may even faint or be unable to swallow. If you cannot treat yourself, someone will need to bring you to the hospital.  °HOME CARE INSTRUCTIONS °· If you have diabetes, follow your diabetes management plan by: °¨ Taking your medicines as directed. °¨ Following your exercise plan. °¨ Following your meal plan. Do not skip meals. Eat on time. °¨ Testing your blood   glucose regularly. Check your blood glucose before and after exercise. If you exercise longer or different than usual, be sure to check blood glucose more frequently. °¨ Wearing your medical alert jewelry that says you have diabetes. °· Identify the cause of your hypoglycemia. Then, develop ways to prevent the recurrence of hypoglycemia. °· Do not take a hot bath or shower right after an  insulin shot. °· Always carry treatment with you. Glucose tablets are the easiest to carry. °· If you are going to drink alcohol, drink it only with meals. °· Tell friends or family members ways to keep you safe during a seizure. This may include removing hard or sharp objects from the area or turning you on your side. °· Maintain a healthy weight. °SEEK MEDICAL CARE IF:  °· You are having problems keeping your blood glucose in your target range. °· You are having frequent episodes of hypoglycemia. °· You feel you might be having side effects from your medicines. °· You are not sure why your blood glucose is dropping so low. °· You notice a change in vision or a new problem with your vision. °SEEK IMMEDIATE MEDICAL CARE IF:  °· Confusion develops. °· A change in mental status occurs. °· The inability to swallow develops. °· Fainting occurs. °Document Released: 11/25/2005 Document Revised: 11/30/2013 Document Reviewed: 03/23/2012 °[employer]® Patient Information ©2015 [employer]. This information is not intended to replace advice given to you by your health care provider. Make sure you discuss any questions you have with your health care provider. ° °

## 2015-06-23 LAB — CMP14+EGFR
ALT: 24 IU/L (ref 0–44)
AST: 16 IU/L (ref 0–40)
Albumin/Globulin Ratio: 1.2 (ref 1.1–2.5)
Albumin: 4 g/dL (ref 3.5–4.8)
Alkaline Phosphatase: 166 IU/L — ABNORMAL HIGH (ref 39–117)
BUN / CREAT RATIO: 29 — AB (ref 10–22)
BUN: 33 mg/dL — ABNORMAL HIGH (ref 8–27)
Bilirubin Total: 1.3 mg/dL — ABNORMAL HIGH (ref 0.0–1.2)
CHLORIDE: 93 mmol/L — AB (ref 97–108)
CO2: 23 mmol/L (ref 18–29)
CREATININE: 1.13 mg/dL (ref 0.76–1.27)
Calcium: 9.1 mg/dL (ref 8.6–10.2)
GFR calc Af Amer: 75 mL/min/{1.73_m2} (ref >59)
GFR calc non Af Amer: 65 mL/min/{1.73_m2} (ref >59)
Globulin, Total: 3.3 g/dL (ref 1.5–4.5)
Glucose: 155 mg/dL — ABNORMAL HIGH (ref 65–99)
POTASSIUM: 4.2 mmol/L (ref 3.5–5.2)
Sodium: 138 mmol/L (ref 134–144)
TOTAL PROTEIN: 7.3 g/dL (ref 6.0–8.5)

## 2015-06-23 LAB — LIPID PANEL
CHOLESTEROL TOTAL: 91 mg/dL — AB (ref 100–199)
Chol/HDL Ratio: 2.8 ratio units (ref 0.0–5.0)
HDL: 33 mg/dL — ABNORMAL LOW (ref >39)
LDL CALC: 37 mg/dL (ref 0–99)
Triglycerides: 104 mg/dL (ref 0–149)
VLDL Cholesterol Cal: 21 mg/dL (ref 5–40)

## 2015-07-08 ENCOUNTER — Other Ambulatory Visit: Payer: Self-pay | Admitting: Family Medicine

## 2015-07-18 ENCOUNTER — Ambulatory Visit (INDEPENDENT_AMBULATORY_CARE_PROVIDER_SITE_OTHER): Payer: Medicare Other | Admitting: Pharmacist Clinician (PhC)/ Clinical Pharmacy Specialist

## 2015-07-18 DIAGNOSIS — I482 Chronic atrial fibrillation, unspecified: Secondary | ICD-10-CM

## 2015-07-18 DIAGNOSIS — I4891 Unspecified atrial fibrillation: Secondary | ICD-10-CM

## 2015-07-18 LAB — POCT INR: INR: 2.9

## 2015-08-21 ENCOUNTER — Encounter: Payer: Self-pay | Admitting: Family Medicine

## 2015-08-21 ENCOUNTER — Ambulatory Visit (INDEPENDENT_AMBULATORY_CARE_PROVIDER_SITE_OTHER): Payer: Medicare Other | Admitting: Family Medicine

## 2015-08-21 VITALS — BP 136/74 | HR 78 | Temp 97.4°F | Ht 74.0 in | Wt 268.6 lb

## 2015-08-21 DIAGNOSIS — R791 Abnormal coagulation profile: Secondary | ICD-10-CM | POA: Diagnosis not present

## 2015-08-21 DIAGNOSIS — N183 Chronic kidney disease, stage 3 (moderate): Secondary | ICD-10-CM

## 2015-08-21 DIAGNOSIS — I4891 Unspecified atrial fibrillation: Secondary | ICD-10-CM

## 2015-08-21 DIAGNOSIS — E1121 Type 2 diabetes mellitus with diabetic nephropathy: Secondary | ICD-10-CM

## 2015-08-21 DIAGNOSIS — I482 Chronic atrial fibrillation, unspecified: Secondary | ICD-10-CM

## 2015-08-21 DIAGNOSIS — R233 Spontaneous ecchymoses: Secondary | ICD-10-CM

## 2015-08-21 LAB — POCT UA - MICROSCOPIC ONLY
BACTERIA, U MICROSCOPIC: NEGATIVE
CRYSTALS, UR, HPF, POC: NEGATIVE
Casts, Ur, LPF, POC: NEGATIVE
Mucus, UA: NEGATIVE
WBC, UR, HPF, POC: NEGATIVE
Yeast, UA: NEGATIVE

## 2015-08-21 LAB — POCT URINALYSIS DIPSTICK
BILIRUBIN UA: NEGATIVE
GLUCOSE UA: NEGATIVE
Ketones, UA: NEGATIVE
LEUKOCYTES UA: NEGATIVE
NITRITE UA: NEGATIVE
PH UA: 5
Protein, UA: NEGATIVE
Spec Grav, UA: 1.01
Urobilinogen, UA: NEGATIVE

## 2015-08-21 LAB — POCT INR: INR: 3.7

## 2015-08-21 NOTE — Patient Instructions (Signed)
Anticoagulation Dose Instructions as of 08/21/2015      Dorene Grebe Tue Wed Thu Fri Sat   New Dose 10 mg 5 mg 5 mg 5 mg 10 mg 5 mg 5 mg    Description        No warfarin today - Monday, September 12th.  Then decrease dose to Continue current dose of 1 tablet sundays and thursdays, 1/2 tablet all other days.       INR was 3.7 today

## 2015-08-21 NOTE — Assessment & Plan Note (Signed)
Patient has a petechial rash and is currently on Coumadin. Coumadin is being managed by a clinical pharmacologist Tammy. His Coumadin levels have been stable at 2.8-2.9 for quite some time. He has not made any medication changes but it is elevated today at 3.7. This is likely the cause of his petechial rash but we will still check a CBC and platelets, a liver panel, renal function and a urinalysis.

## 2015-08-21 NOTE — Progress Notes (Signed)
BP 136/74 mmHg  Pulse 78  Temp(Src) 97.4 F (36.3 C) (Oral)  Ht _0  (1.88 m)  Wt 268 lb 9.6 oz (121.836 kg)  BMI 34.47 kg/m2   Subjective:    Patient ID: Melvin Wang, male    DOB: 1941/12/01, 74 y.o.   MRN: [id number]  HPI: Melvin Wang is a 74 y.o. male presenting on 08/21/2015 for Rash   HPI  Rash Patient presents today with a rash that is small red dots on his arms and his legs. He denies any fevers or chills or recent upper respiratory illness, he also denies any pain or burning when he urinates. The rash initially came on a week ago got better for a couple days and then came back yesterday. The rash is nonpruritic and not raised and has no real texture to it. His also had a little bit more bruising with his insulin shots in his abdomen, but no other bruising.  Relevant past medical, surgical, family and social history reviewed and updated as indicated. Interim medical history since our last visit reviewed. Allergies and medications reviewed and updated.  Review of Systems  Constitutional: Negative for fever.  HENT: Negative for ear discharge and ear pain.   Eyes: Negative for discharge and visual disturbance.  Respiratory: Negative for shortness of breath and wheezing.   Cardiovascular: Negative for chest pain and leg swelling.  Gastrointestinal: Negative for abdominal pain, diarrhea and constipation.  Genitourinary: Negative for difficulty urinating.  Musculoskeletal: Negative for back pain and gait problem.  Skin: Positive for rash.  Neurological: Negative for syncope, light-headedness and headaches.  All other systems reviewed and are negative.   Per HPI unless specifically indicated above     Medication List       This list is accurate as of: 08/21/15  9:52 AM.  Always use your most recent med list.               allopurinol 300 MG tablet  Commonly known as:  ZYLOPRIM  TAKE 1 TABLET EVERY DAY     atenolol 50 MG tablet  Commonly known as:   TENORMIN  TAKE 1 TABLET (50 MG TOTAL) BY MOUTH DAILY.     atorvastatin 40 MG tablet  Commonly known as:  LIPITOR  TAKE 1 TABLET BY MOUTH AT BEDTIME     B-D UF III MINI PEN NEEDLES 31G X 5 MM Misc  Generic drug:  Insulin Pen Needle  USE 1 TIME DAILY WITH LANTUS SOLOSTAR     calcitRIOL 0.5 MCG capsule  Commonly known as:  ROCALTROL  take 1 capsule every other day     furosemide 80 MG tablet  Commonly known as:  LASIX  TAKE 2 TABLETS (160 MG TOTAL) BY MOUTH 2 (TWO) TIMES DAILY.     Insulin Glargine 100 UNIT/ML Solostar Pen  Commonly known as:  LANTUS SOLOSTAR  Inject 30 Units into the skin every morning.     lisinopril 20 MG tablet  Commonly known as:  PRINIVIL,ZESTRIL  Take 0.5 tablets (10 mg total) by mouth daily.     ONE TOUCH ULTRA TEST test strip  Generic drug:  glucose blood  CHECH BLOOD SUGAR TWICE A DAY     warfarin 10 MG tablet  Commonly known as:  COUMADIN  Take as directed by anticoagulation dose - 1/2 to 1 tablet daily           Objective:    BP 136/74 mmHg  Pulse 78  Temp(Src) 97.4  F (36.3 C) (Oral)  Ht _0  (1.88 m)  Wt 268 lb 9.6 oz (121.836 kg)  BMI 34.47 kg/m2  Wt Readings from Last 3 Encounters:  08/21/15 268 lb 9.6 oz (121.836 kg)  06/22/15 279 lb 9.6 oz (126.826 kg)  05/05/15 278 lb (126.1 kg)    Physical Exam  Constitutional: He is oriented to person, place, and time. He appears well-developed and well-nourished. No distress.  Eyes: Conjunctivae and EOM are normal. Right eye exhibits no discharge. No scleral icterus.  Neck: Neck supple. No thyromegaly present.  Cardiovascular: Normal rate, regular rhythm, normal heart sounds and intact distal pulses.   No murmur heard. Pulmonary/Chest: Effort normal and breath sounds normal. No respiratory distress. He has no wheezes. He has no rales.  Abdominal: Soft. Bowel sounds are normal. He exhibits no distension. There is no tenderness.  Musculoskeletal: Normal range of motion. He exhibits no  edema or tenderness.  Lymphadenopathy:    He has no cervical adenopathy.  Neurological: He is alert and oriented to person, place, and time. Coordination normal.  Skin: Skin is warm and dry. Rash (etechial rash covering her arms and legs. Nonblanching flat without any texture.) noted. He is not diaphoretic.  Psychiatric: He has a normal mood and affect. His behavior is normal.  Vitals reviewed.   Results for orders placed or performed in visit on 08/21/15  POCT INR  Result Value Ref Range   INR 3.7       Assessment & Plan:   Problem List Items Addressed This Visit      Cardiovascular and Mediastinum   Atrial fibrillation     Other   Petechial rash - Primary    Patient has a petechial rash and is currently on Coumadin. Coumadin is being managed by a clinical pharmacologist Tammy. His Coumadin levels have been stable at 2.8-2.9 for quite some time. He has not made any medication changes but it is elevated today at 3.7. This is likely the cause of his petechial rash but we will still check a CBC and platelets, a liver panel, renal function and a urinalysis.      Relevant Orders   CMP14+EGFR   POCT INR   CBC with Differential/Platelet    Other Visit Diagnoses    Supratherapeutic INR        will discuss with Tammy right now for medication changes, will still check other labs.        Follow up plan: Return in about 2 weeks (around 09/04/2015), or if symptoms worsen or fail to improve.  Caryl Pina, MD [facility] 08/21/2015, 9:52 AM

## 2015-08-22 LAB — CBC WITH DIFFERENTIAL/PLATELET
BASOS: 0 %
Basophils Absolute: 0 10*3/uL (ref 0.0–0.2)
EOS (ABSOLUTE): 0.1 10*3/uL (ref 0.0–0.4)
Eos: 1 %
HEMOGLOBIN: 13.4 g/dL (ref 12.6–17.7)
Hematocrit: 40.5 % (ref 37.5–51.0)
IMMATURE GRANS (ABS): 0 10*3/uL (ref 0.0–0.1)
IMMATURE GRANULOCYTES: 0 %
LYMPHS: 16 %
Lymphocytes Absolute: 1.8 10*3/uL (ref 0.7–3.1)
MCH: 32.1 pg (ref 26.6–33.0)
MCHC: 33.1 g/dL (ref 31.5–35.7)
MCV: 97 fL (ref 79–97)
MONOCYTES: 6 %
Monocytes Absolute: 0.7 10*3/uL (ref 0.1–0.9)
NEUTROS ABS: 8.5 10*3/uL — AB (ref 1.4–7.0)
NEUTROS PCT: 77 %
PLATELETS: 212 10*3/uL (ref 150–379)
RBC: 4.17 x10E6/uL (ref 4.14–5.80)
RDW: 16.5 % — ABNORMAL HIGH (ref 12.3–15.4)
WBC: 11.1 10*3/uL — ABNORMAL HIGH (ref 3.4–10.8)

## 2015-08-22 LAB — CMP14+EGFR
A/G RATIO: 1.3 (ref 1.1–2.5)
ALT: 33 IU/L (ref 0–44)
AST: 22 IU/L (ref 0–40)
Albumin: 4.3 g/dL (ref 3.5–4.8)
Alkaline Phosphatase: 189 IU/L — ABNORMAL HIGH (ref 39–117)
BILIRUBIN TOTAL: 1.2 mg/dL (ref 0.0–1.2)
BUN/Creatinine Ratio: 31 — ABNORMAL HIGH (ref 10–22)
BUN: 36 mg/dL — ABNORMAL HIGH (ref 8–27)
CALCIUM: 9.1 mg/dL (ref 8.6–10.2)
CO2: 25 mmol/L (ref 18–29)
Chloride: 92 mmol/L — ABNORMAL LOW (ref 97–108)
Creatinine, Ser: 1.15 mg/dL (ref 0.76–1.27)
GFR calc Af Amer: 73 mL/min/{1.73_m2} (ref >59)
GFR, EST NON AFRICAN AMERICAN: 63 mL/min/{1.73_m2} (ref >59)
GLUCOSE: 135 mg/dL — AB (ref 65–99)
Globulin, Total: 3.4 g/dL (ref 1.5–4.5)
POTASSIUM: 4.1 mmol/L (ref 3.5–5.2)
Sodium: 135 mmol/L (ref 134–144)
Total Protein: 7.7 g/dL (ref 6.0–8.5)

## 2015-08-22 LAB — MICROALBUMIN / CREATININE URINE RATIO
Creatinine, Urine: 71.4 mg/dL
MICROALB/CREAT RATIO: 71.4 mg/g{creat} — AB (ref 0.0–30.0)
MICROALBUM., U, RANDOM: 51 ug/mL

## 2015-08-30 ENCOUNTER — Other Ambulatory Visit: Payer: Self-pay | Admitting: Family Medicine

## 2015-09-04 ENCOUNTER — Encounter: Payer: Self-pay | Admitting: Family Medicine

## 2015-09-04 ENCOUNTER — Ambulatory Visit (INDEPENDENT_AMBULATORY_CARE_PROVIDER_SITE_OTHER): Payer: Medicare Other | Admitting: Family Medicine

## 2015-09-04 VITALS — Temp 97.0°F | Ht 74.0 in | Wt 272.8 lb

## 2015-09-04 DIAGNOSIS — I4891 Unspecified atrial fibrillation: Secondary | ICD-10-CM | POA: Diagnosis not present

## 2015-09-04 DIAGNOSIS — E1121 Type 2 diabetes mellitus with diabetic nephropathy: Secondary | ICD-10-CM

## 2015-09-04 LAB — POCT GLYCOSYLATED HEMOGLOBIN (HGB A1C): Hemoglobin A1C: 7.2

## 2015-09-04 LAB — POCT INR: INR: 2.4

## 2015-09-04 MED ORDER — INSULIN GLARGINE 100 UNIT/ML SOLOSTAR PEN: 40.0000 [IU] | PEN_INJECTOR | Freq: Every morning | SUBCUTANEOUS | Status: DC

## 2015-09-04 NOTE — Progress Notes (Signed)
Subjective:  Patient ID: Melvin Wang, male    DOB: 11/12/1941  Age: 74 y.o. MRN: [id number]  CC: Rash and Diabetes   HPI Melvin Wang presents for  follow-up of hypertension. Patient has no history of headache chest pain or shortness of breath or recent cough. Patient also denies symptoms of TIA such as numbness weakness lateralizing. Patient checks  blood pressure at home and has not had any elevated readings recently. Patient denies side effects from his medication. States taking it regularly.  Patient also  in for follow-up of elevated cholesterol. Doing well without complaints on current medication. Denies side effects of statin including myalgia and arthralgia and nausea. Also in today for liver function testing. Currently no chest pain, shortness of breath or other cardiovascular related symptoms noted.  Follow-up of diabetes. Patient does check blood sugar at home. Readings run between 115 and 130 fasting. 150-170 post prandial. Patient denies symptoms such as polyuria, polydipsia, excessive hunger, nausea No significant hypoglycemic spells noted. Medications as noted below. Taking them regularly without complication/adverse reaction being reported today. Patient states he has watched his diet as closely as possible and he is exercising daily. In fact he says when he doesn't exercise he gets annoyed with himself and he rarely misses a day.   History Melvin Wang has a past medical history of Movement disorder; Abnormality of gait; Hypertension; Diabetes mellitus without complication; Chronic kidney disease; Gout; Dysrhythmia; Cataract; Hyperlipidemia; and Foot ulcer, right (03/2013).   He has past surgical history that includes Cataract extraction w/PHACO (Left, 04/19/2013) and Cataract extraction w/PHACO (Right, 07/15/2013).   His family history includes Cancer in his father and paternal uncle.He reports that he quit smoking about 31 years ago. His smoking use included Cigarettes.  He has a 15 pack-year smoking history. He has never used smokeless tobacco. He reports that he does not drink alcohol or use illicit drugs.  Current Outpatient Prescriptions on File Prior to Visit  Medication Sig Dispense Refill  . allopurinol (ZYLOPRIM) 300 MG tablet TAKE 1 TABLET EVERY DAY 90 tablet 1  . atenolol (TENORMIN) 50 MG tablet TAKE 1 TABLET (50 MG TOTAL) BY MOUTH DAILY. 90 tablet 1  . atorvastatin (LIPITOR) 40 MG tablet TAKE 1 TABLET BY MOUTH AT BEDTIME 90 tablet 1  . B-D UF III MINI PEN NEEDLES 31G X 5 MM MISC USE 1 TIME DAILY WITH LANTUS SOLOSTAR 100 each 2  . calcitRIOL (ROCALTROL) 0.5 MCG capsule take 1 capsule every other day    . furosemide (LASIX) 80 MG tablet TAKE 2 TABLETS (160 MG TOTAL) BY MOUTH 2 (TWO) TIMES DAILY. 360 tablet 0  . lisinopril (PRINIVIL,ZESTRIL) 20 MG tablet Take 0.5 tablets (10 mg total) by mouth daily. 45 tablet 1  . ONE TOUCH ULTRA TEST test strip CHECH BLOOD SUGAR TWICE A DAY 100 each 1  . warfarin (COUMADIN) 10 MG tablet Take as directed by anticoagulation dose - 1/2 to 1 tablet daily 90 tablet 1   No current facility-administered medications on file prior to visit.    ROS Review of Systems  Constitutional: Negative for fever, chills and diaphoresis.  HENT: Negative for congestion, rhinorrhea and sore throat.   Respiratory: Negative for cough, shortness of breath and wheezing.   Cardiovascular: Negative for chest pain.  Gastrointestinal: Negative for nausea, vomiting, abdominal pain, diarrhea, constipation and abdominal distention.  Genitourinary: Negative for dysuria and frequency.  Musculoskeletal: Negative for joint swelling and arthralgias.  Skin: Positive for rash (minimal fading petechiae on the  forearms).  Neurological: Negative for headaches.    Objective:  Temp(Src) 97 F (36.1 C) (Oral)  Ht 6\' 2"  (1.88 m)  Wt 272 lb 12.8 oz (123.741 kg)  BMI 35.01 kg/m2  BP Readings from Last 3 Encounters:  08/21/15 136/74  06/22/15 123/67    05/05/15 128/78    Wt Readings from Last 3 Encounters:  09/04/15 272 lb 12.8 oz (123.741 kg)  08/21/15 268 lb 9.6 oz (121.836 kg)  06/22/15 279 lb 9.6 oz (126.826 kg)     Physical Exam  Constitutional: He is oriented to person, place, and time. He appears well-developed and well-nourished. No distress.  Weight has come down 7 pounds since last checkup  HENT:  Head: Normocephalic and atraumatic.  Right Ear: External ear normal.  Left Ear: External ear normal.  Nose: Nose normal.  Mouth/Throat: Oropharynx is clear and moist.  Eyes: Conjunctivae and EOM are normal. Pupils are equal, round, and reactive to light.  Neck: Normal range of motion. Neck supple. No thyromegaly present.  Cardiovascular: Normal rate, regular rhythm and normal heart sounds.   No murmur heard. Pulmonary/Chest: Effort normal and breath sounds normal. No respiratory distress. He has no wheezes. He has no rales.  Abdominal: Soft. Bowel sounds are normal. He exhibits no distension. There is no tenderness.  Lymphadenopathy:    He has no cervical adenopathy.  Neurological: He is alert and oriented to person, place, and time. He has normal reflexes.  Skin: Skin is warm and dry. Rash (minimal fading petechiae on the forearm) noted.  Psychiatric: He has a normal mood and affect. His behavior is normal. Judgment and thought content normal.    Lab Results  Component Value Date   HGBA1C 7.2 09/04/2015   HGBA1C 7.0 05/05/2015   HGBA1C 8.1 01/27/2015    Lab Results  Component Value Date   WBC 11.1* 08/21/2015   HGB 12.6* 06/22/2015   HCT 40.5 08/21/2015   PLT 255 02/19/2014   GLUCOSE 135* 08/21/2015   CHOL 91* 06/22/2015   TRIG 104 06/22/2015   HDL 33* 06/22/2015   LDLCALC 37 06/22/2015   ALT 33 08/21/2015   AST 22 08/21/2015   NA 135 08/21/2015   K 4.1 08/21/2015   CL 92* 08/21/2015   CREATININE 1.15 08/21/2015   BUN 36* 08/21/2015   CO2 25 08/21/2015   TSH 1.030 08/26/2014   PSA 2.2 08/26/2014    INR 2.4 09/04/2015   HGBA1C 7.2 09/04/2015    No results found.  Assessment & Plan:   Melvin Wang was seen today for rash and diabetes.  Diagnoses and all orders for this visit:  Controlled type 2 diabetes mellitus with diabetic nephropathy -     POCT glycosylated hemoglobin (Hb A1C) -     Cancel: POCT UA - Microalbumin  Atrial fibrillation, unspecified -     POCT INR  Other orders -     Insulin Glargine (LANTUS SOLOSTAR) 100 UNIT/ML Solostar Pen; Inject 40 Units into the skin every morning.   I have changed Melvin Wang Insulin Glargine. I am also having him maintain his calcitRIOL, allopurinol, atenolol, lisinopril, warfarin, ONE TOUCH ULTRA TEST, furosemide, atorvastatin, and B-D UF III MINI PEN NEEDLES.  Meds ordered this encounter  Medications  . Insulin Glargine (LANTUS SOLOSTAR) 100 UNIT/ML Solostar Pen    Sig: Inject 40 Units into the skin every morning.    Dispense:  5 pen    Refill:  PRN     Follow-up: Return in about 3 months (around  12/04/2015) for diabetes, cholesterol.  Claretta Fraise, M.D.

## 2015-09-05 ENCOUNTER — Encounter: Payer: Self-pay | Admitting: Pharmacist Clinician (PhC)/ Clinical Pharmacy Specialist

## 2015-09-05 ENCOUNTER — Other Ambulatory Visit: Payer: Self-pay | Admitting: *Deleted

## 2015-09-05 MED ORDER — ALLOPURINOL 300 MG PO TABS: 300.0000 mg | ORAL_TABLET | Freq: Every day | ORAL | Status: DC

## 2015-09-12 ENCOUNTER — Other Ambulatory Visit: Payer: Self-pay

## 2015-09-12 MED ORDER — FUROSEMIDE 80 MG PO TABS: ORAL_TABLET | ORAL | Status: DC

## 2015-09-19 DIAGNOSIS — L84 Corns and callosities: Secondary | ICD-10-CM | POA: Diagnosis not present

## 2015-09-19 DIAGNOSIS — E1142 Type 2 diabetes mellitus with diabetic polyneuropathy: Secondary | ICD-10-CM | POA: Diagnosis not present

## 2015-09-19 DIAGNOSIS — B351 Tinea unguium: Secondary | ICD-10-CM | POA: Diagnosis not present

## 2015-09-25 DIAGNOSIS — D631 Anemia in chronic kidney disease: Secondary | ICD-10-CM | POA: Diagnosis not present

## 2015-09-25 DIAGNOSIS — Z23 Encounter for immunization: Secondary | ICD-10-CM | POA: Diagnosis not present

## 2015-09-25 DIAGNOSIS — E1122 Type 2 diabetes mellitus with diabetic chronic kidney disease: Secondary | ICD-10-CM | POA: Diagnosis not present

## 2015-09-25 DIAGNOSIS — I129 Hypertensive chronic kidney disease with stage 1 through stage 4 chronic kidney disease, or unspecified chronic kidney disease: Secondary | ICD-10-CM | POA: Diagnosis not present

## 2015-09-25 DIAGNOSIS — N183 Chronic kidney disease, stage 3 (moderate): Secondary | ICD-10-CM | POA: Diagnosis not present

## 2015-10-12 ENCOUNTER — Other Ambulatory Visit: Payer: Self-pay

## 2015-10-12 MED ORDER — LISINOPRIL 20 MG PO TABS
10.0000 mg | ORAL_TABLET | Freq: Every day | ORAL | Status: DC
Start: 2015-10-12 — End: 2016-01-24

## 2015-10-16 ENCOUNTER — Ambulatory Visit (INDEPENDENT_AMBULATORY_CARE_PROVIDER_SITE_OTHER): Payer: Medicare Other | Admitting: Pharmacist

## 2015-10-16 DIAGNOSIS — I4891 Unspecified atrial fibrillation: Secondary | ICD-10-CM

## 2015-10-16 LAB — POCT INR: INR: 2.5

## 2015-10-16 NOTE — Patient Instructions (Signed)
Anticoagulation Dose Instructions as of 10/16/2015      Melvin Wang Tue Wed Thu Fri Sat   New Dose 10 mg 5 mg 5 mg 5 mg 10 mg 5 mg 5 mg    Description        Continue current dose of 1 tablet sundays and thursdays, 1/2 tablet all other days.

## 2015-10-26 ENCOUNTER — Encounter: Payer: Self-pay | Admitting: *Deleted

## 2015-11-06 ENCOUNTER — Encounter: Payer: Self-pay | Admitting: Pharmacist

## 2015-11-06 ENCOUNTER — Ambulatory Visit (INDEPENDENT_AMBULATORY_CARE_PROVIDER_SITE_OTHER): Payer: Medicare Other | Admitting: Pharmacist

## 2015-11-06 VITALS — Wt 270.0 lb

## 2015-11-06 DIAGNOSIS — I4891 Unspecified atrial fibrillation: Secondary | ICD-10-CM | POA: Diagnosis not present

## 2015-11-06 LAB — POCT INR: INR: 2

## 2015-11-06 NOTE — Patient Instructions (Signed)
Anticoagulation Dose Instructions as of 11/06/2015      Melvin Wang Tue Wed Thu Fri Sat   New Dose 10 mg 5 mg 5 mg 5 mg 5 mg 5 mg 5 mg    Description        Take 1 tablet (10mg ) on Sunday and 1/2 tablet (5mg ) all other days.        INR today 2.0

## 2015-11-16 ENCOUNTER — Other Ambulatory Visit: Payer: Self-pay | Admitting: *Deleted

## 2015-11-16 DIAGNOSIS — Z1211 Encounter for screening for malignant neoplasm of colon: Secondary | ICD-10-CM

## 2015-11-20 ENCOUNTER — Ambulatory Visit (INDEPENDENT_AMBULATORY_CARE_PROVIDER_SITE_OTHER): Payer: Medicare Other | Admitting: Pharmacist

## 2015-11-20 DIAGNOSIS — I4891 Unspecified atrial fibrillation: Secondary | ICD-10-CM

## 2015-11-20 LAB — POCT INR: INR: 2.2

## 2015-11-20 NOTE — Patient Instructions (Signed)
Anticoagulation Dose Instructions as of 11/20/2015      Dorene Grebe Tue Wed Thu Fri Sat   New Dose 10 mg 5 mg 5 mg 5 mg 5 mg 5 mg 5 mg    Description        Take 1 tablet (10mg ) on Sunday and 1/2 tablet (5mg ) all other days.       INR was 2.2 today

## 2015-11-22 ENCOUNTER — Other Ambulatory Visit: Payer: Medicare Other

## 2015-11-22 DIAGNOSIS — Z1211 Encounter for screening for malignant neoplasm of colon: Secondary | ICD-10-CM | POA: Diagnosis not present

## 2015-11-22 NOTE — Progress Notes (Signed)
Lab only 

## 2015-11-24 LAB — FECAL OCCULT BLOOD, IMMUNOCHEMICAL: Fecal Occult Bld: NEGATIVE

## 2015-12-06 ENCOUNTER — Ambulatory Visit: Payer: Medicare Other | Admitting: Family Medicine

## 2015-12-07 ENCOUNTER — Ambulatory Visit (INDEPENDENT_AMBULATORY_CARE_PROVIDER_SITE_OTHER): Payer: Medicare Other | Admitting: Family Medicine

## 2015-12-07 ENCOUNTER — Encounter: Payer: Self-pay | Admitting: Family Medicine

## 2015-12-07 VITALS — BP 104/63 | HR 80 | Temp 96.8°F | Ht 74.0 in | Wt 267.0 lb

## 2015-12-07 DIAGNOSIS — I4891 Unspecified atrial fibrillation: Secondary | ICD-10-CM

## 2015-12-07 DIAGNOSIS — Z794 Long term (current) use of insulin: Secondary | ICD-10-CM | POA: Diagnosis not present

## 2015-12-07 DIAGNOSIS — Z139 Encounter for screening, unspecified: Secondary | ICD-10-CM | POA: Diagnosis not present

## 2015-12-07 DIAGNOSIS — E1121 Type 2 diabetes mellitus with diabetic nephropathy: Secondary | ICD-10-CM

## 2015-12-07 DIAGNOSIS — I10 Essential (primary) hypertension: Secondary | ICD-10-CM | POA: Diagnosis not present

## 2015-12-07 DIAGNOSIS — E785 Hyperlipidemia, unspecified: Secondary | ICD-10-CM | POA: Diagnosis not present

## 2015-12-07 LAB — POCT URINALYSIS DIPSTICK
BILIRUBIN UA: NEGATIVE
GLUCOSE UA: NEGATIVE
KETONES UA: NEGATIVE
LEUKOCYTES UA: NEGATIVE
Nitrite, UA: NEGATIVE
PH UA: 5
Protein, UA: NEGATIVE
Spec Grav, UA: 1.01
Urobilinogen, UA: NEGATIVE

## 2015-12-07 LAB — POCT GLYCOSYLATED HEMOGLOBIN (HGB A1C): HEMOGLOBIN A1C: 7

## 2015-12-07 NOTE — Progress Notes (Signed)
Subjective:  Patient ID: Melvin Wang, male    DOB: 12/14/1940  Age: 74 y.o. MRN: [id number]  CC: Diabetes; Hyperlipidemia; and Hypertension   HPI Melvin Wang presents for  follow-up of hypertension. Patient has no history of headache chest pain or shortness of breath or recent cough. Patient also denies symptoms of TIA such as numbness weakness lateralizing. Patient checks  blood pressure at home and has not had any elevated readings recently. Patient denies side effects from his medication. States taking it regularly.  Patient also  in for follow-up of elevated cholesterol. Doing well without complaints on current medication. Denies side effects of statin including myalgia and arthralgia and nausea. Also in today for liver function testing. Currently no chest pain, shortness of breath or other cardiovascular related symptoms noted.  Follow-up of diabetes. Patient does check blood sugar at home. Readings run between 17fsting and 150PP Patient denies symptoms such as polyuria, polydipsia, excessive hunger, nausea No significant hypoglycemic spells noted. Medications as noted below. Taking them regularly without complication/adverse reaction being reported today.    History CTynerhas a past medical history of Movement disorder; Abnormality of gait; Hypertension; Diabetes mellitus without complication ([place]; Chronic kidney disease; Gout; Dysrhythmia; Cataract; Hyperlipidemia; and [place] ulcer, right ([place] (03/2013).   He has past surgical history that includes Cataract extraction w/PHACO (Left, 04/19/2013) and Cataract extraction w/PHACO (Right, 07/15/2013).   His family history includes Cancer in his father and paternal uncle.He reports that he quit smoking about 31 years ago. His smoking use included Cigarettes. He has a 15 pack-year smoking history. He has never used smokeless tobacco. He reports that he does not drink alcohol or use illicit drugs.  Current Outpatient  Prescriptions on File Prior to Visit  Medication Sig Dispense Refill  . allopurinol (ZYLOPRIM) 300 MG tablet Take 1 tablet (300 mg total) by mouth daily. 90 tablet 1  . atenolol (TENORMIN) 50 MG tablet TAKE 1 TABLET (50 MG TOTAL) BY MOUTH DAILY. 90 tablet 1  . atorvastatin (LIPITOR) 40 MG tablet TAKE 1 TABLET BY MOUTH AT BEDTIME 90 tablet 1  . B-D UF III MINI PEN NEEDLES 31G X 5 MM MISC USE 1 TIME DAILY WITH LANTUS SOLOSTAR 100 each 2  . calcitRIOL (ROCALTROL) 0.5 MCG capsule take 1 capsule every other day    . furosemide (LASIX) 80 MG tablet TAKE 2 TABLETS (160 MG TOTAL) BY MOUTH 2 (TWO) TIMES DAILY. 360 tablet 1  . Insulin Glargine (LANTUS SOLOSTAR) 100 UNIT/ML Solostar Pen Inject 40 Units into the skin every morning. 5 pen PRN  . lisinopril (PRINIVIL,ZESTRIL) 20 MG tablet Take 0.5 tablets (10 mg total) by mouth daily. 45 tablet 3  . ONE TOUCH ULTRA TEST test strip CHECH BLOOD SUGAR TWICE A DAY 100 each 1  . warfarin (COUMADIN) 10 MG tablet Take as directed by anticoagulation dose - 1/2 to 1 tablet daily 90 tablet 1   No current facility-administered medications on file prior to visit.    ROS Review of Systems  Constitutional: Negative for fever, chills, diaphoresis and unexpected weight change.  HENT: Negative for congestion, hearing loss, rhinorrhea and sore throat.   Eyes: Negative for visual disturbance.  Respiratory: Negative for cough and shortness of breath.   Cardiovascular: Negative for chest pain.  Gastrointestinal: Negative for abdominal pain, diarrhea and constipation.  Genitourinary: Negative for dysuria and flank pain.  Musculoskeletal: Negative for joint swelling and arthralgias.  Skin: Negative for rash.  Neurological: Negative for dizziness and headaches.  Psychiatric/Behavioral: Negative for sleep disturbance and dysphoric mood.    Objective:  BP 104/63 mmHg  Pulse 80  Temp(Src) 96.8 F (36 C) (Oral)  Ht 6' 2"  (1.88 m)  Wt 267 lb (121.11 kg)  BMI 34.27 kg/m2   SpO2 84%  BP Readings from Last 3 Encounters:  12/07/15 104/63  08/21/15 136/74  06/22/15 123/67    Wt Readings from Last 3 Encounters:  12/07/15 267 lb (121.11 kg)  11/06/15 270 lb (122.471 kg)  09/04/15 272 lb 12.8 oz (123.741 kg)     Physical Exam  Constitutional: He is oriented to person, place, and time. He appears well-developed and well-nourished. No distress.  HENT:  Head: Normocephalic and atraumatic.  Right Ear: External ear normal.  Left Ear: External ear normal.  Nose: Nose normal.  Mouth/Throat: Oropharynx is clear and moist.  Eyes: Conjunctivae and EOM are normal. Pupils are equal, round, and reactive to light.  Neck: Normal range of motion. Neck supple. No thyromegaly present.  Cardiovascular: Normal rate, regular rhythm and normal heart sounds.   No murmur heard. Pulmonary/Chest: Effort normal and breath sounds normal. No respiratory distress. He has no wheezes. He has no rales.  Abdominal: Soft. Bowel sounds are normal. He exhibits no distension. There is no tenderness.  Lymphadenopathy:    He has no cervical adenopathy.  Neurological: He is alert and oriented to person, place, and time. He has normal reflexes.  Skin: Skin is warm and dry.  Psychiatric: He has a normal mood and affect. His behavior is normal. Judgment and thought content normal.    Lab Results  Component Value Date   HGBA1C 7.2 09/04/2015   HGBA1C 7.0 05/05/2015   HGBA1C 8.1 01/27/2015    Lab Results  Component Value Date   WBC 11.1* 08/21/2015   HGB 12.6* 06/22/2015   HCT 40.5 08/21/2015   PLT 255 02/19/2014   GLUCOSE 135* 08/21/2015   CHOL 91* 06/22/2015   TRIG 104 06/22/2015   HDL 33* 06/22/2015   LDLCALC 37 06/22/2015   ALT 33 08/21/2015   AST 22 08/21/2015   NA 135 08/21/2015   K 4.1 08/21/2015   CL 92* 08/21/2015   CREATININE 1.15 08/21/2015   BUN 36* 08/21/2015   CO2 25 08/21/2015   TSH 1.030 08/26/2014   PSA 2.2 08/26/2014   INR 2.2 11/20/2015   HGBA1C 7.2  09/04/2015    No results found.  Assessment & Plan:   Melvin Wang was seen today for diabetes, hyperlipidemia and hypertension.  Diagnoses and all orders for this visit:  Essential hypertension -     CBC with Differential/Platelet -     CMP14+EGFR  Controlled type 2 diabetes mellitus with diabetic nephropathy, with long-term current use of insulin (HCC) -     POCT glycosylated hemoglobin (Hb A1C) -     Microalbumin / creatinine urine ratio -     POCT urinalysis dipstick  Atrial fibrillation, unspecified type ([place]) -     CBC with Differential/Platelet  Hyperlipemia -     Lipid panel  Screening -     PSA, total and free -     TSH   I am having Melvin Wang maintain his calcitRIOL, atenolol, warfarin, ONE TOUCH ULTRA TEST, atorvastatin, B-D UF III MINI PEN NEEDLES, Insulin Glargine, allopurinol, furosemide, and lisinopril.  No orders of the defined types were placed in this encounter.     Follow-up: Return in about 3 months (around 03/06/2016) for diabetes, hypertension, cholesterol.  Claretta Fraise, M.D.

## 2015-12-07 NOTE — Patient Instructions (Signed)
Your blood sugar levels looked really good thanks for bringing in your readings from your monitor. Based on this I'm sure you will have an excellent A1c. You should get a call later today or tomorrow for the result.  Keep working on bringing your weight down and you will do well!

## 2015-12-08 LAB — CBC WITH DIFFERENTIAL/PLATELET
BASOS ABS: 0 10*3/uL (ref 0.0–0.2)
BASOS: 0 %
EOS (ABSOLUTE): 0.1 10*3/uL (ref 0.0–0.4)
EOS: 1 %
Hematocrit: 37.8 % (ref 37.5–51.0)
Hemoglobin: 12.9 g/dL (ref 12.6–17.7)
IMMATURE GRANS (ABS): 0 10*3/uL (ref 0.0–0.1)
IMMATURE GRANULOCYTES: 0 %
Lymphocytes Absolute: 1.8 10*3/uL (ref 0.7–3.1)
Lymphs: 16 %
MCH: 33.1 pg — AB (ref 26.6–33.0)
MCHC: 34.1 g/dL (ref 31.5–35.7)
MCV: 97 fL (ref 79–97)
MONOCYTES: 8 %
Monocytes Absolute: 0.9 10*3/uL (ref 0.1–0.9)
Neutrophils Absolute: 8.8 10*3/uL — ABNORMAL HIGH (ref 1.4–7.0)
Neutrophils: 75 %
PLATELETS: 219 10*3/uL (ref 150–379)
RBC: 3.9 x10E6/uL — ABNORMAL LOW (ref 4.14–5.80)
RDW: 15.7 % — ABNORMAL HIGH (ref 12.3–15.4)
WBC: 11.7 10*3/uL — AB (ref 3.4–10.8)

## 2015-12-08 LAB — CMP14+EGFR
ALT: 27 IU/L (ref 0–44)
AST: 21 IU/L (ref 0–40)
Albumin/Globulin Ratio: 1.3 (ref 1.1–2.5)
Albumin: 4.1 g/dL (ref 3.5–4.8)
Alkaline Phosphatase: 162 IU/L — ABNORMAL HIGH (ref 39–117)
BILIRUBIN TOTAL: 1.3 mg/dL — AB (ref 0.0–1.2)
BUN/Creatinine Ratio: 36 — ABNORMAL HIGH (ref 10–22)
BUN: 47 mg/dL — AB (ref 8–27)
CALCIUM: 9 mg/dL (ref 8.6–10.2)
CHLORIDE: 92 mmol/L — AB (ref 96–106)
CO2: 24 mmol/L (ref 18–29)
Creatinine, Ser: 1.3 mg/dL — ABNORMAL HIGH (ref 0.76–1.27)
GFR calc non Af Amer: 55 mL/min/{1.73_m2} — ABNORMAL LOW (ref >59)
GFR, EST AFRICAN AMERICAN: 63 mL/min/{1.73_m2} (ref >59)
GLUCOSE: 130 mg/dL — AB (ref 65–99)
Globulin, Total: 3.1 g/dL (ref 1.5–4.5)
Potassium: 3.9 mmol/L (ref 3.5–5.2)
Sodium: 137 mmol/L (ref 134–144)
TOTAL PROTEIN: 7.2 g/dL (ref 6.0–8.5)

## 2015-12-08 LAB — LIPID PANEL
CHOLESTEROL TOTAL: 94 mg/dL — AB (ref 100–199)
Chol/HDL Ratio: 2.7 ratio units (ref 0.0–5.0)
HDL: 35 mg/dL — AB (ref >39)
LDL Calculated: 38 mg/dL (ref 0–99)
TRIGLYCERIDES: 107 mg/dL (ref 0–149)
VLDL CHOLESTEROL CAL: 21 mg/dL (ref 5–40)

## 2015-12-08 LAB — PSA, TOTAL AND FREE
PROSTATE SPECIFIC AG, SERUM: 3 ng/mL (ref 0.0–4.0)
PSA, Free Pct: 12.3 %
PSA, Free: 0.37 ng/mL

## 2015-12-08 LAB — MICROALBUMIN / CREATININE URINE RATIO
CREATININE, UR: 62 mg/dL
MICROALB/CREAT RATIO: 174 mg/g{creat} — AB (ref 0.0–30.0)
MICROALBUM., U, RANDOM: 107.9 ug/mL

## 2015-12-08 LAB — TSH: TSH: 1.63 u[IU]/mL (ref 0.450–4.500)

## 2015-12-20 ENCOUNTER — Other Ambulatory Visit: Payer: Self-pay | Admitting: Family Medicine

## 2015-12-20 NOTE — Progress Notes (Addendum)
PCV 13 for intramuscular use with administration fee - given day of appt 12/20/14.

## 2015-12-26 DIAGNOSIS — E1142 Type 2 diabetes mellitus with diabetic polyneuropathy: Secondary | ICD-10-CM | POA: Diagnosis not present

## 2015-12-26 DIAGNOSIS — B351 Tinea unguium: Secondary | ICD-10-CM | POA: Diagnosis not present

## 2015-12-26 DIAGNOSIS — L84 Corns and callosities: Secondary | ICD-10-CM | POA: Diagnosis not present

## 2016-01-09 ENCOUNTER — Other Ambulatory Visit: Payer: Self-pay | Admitting: Family Medicine

## 2016-01-24 ENCOUNTER — Ambulatory Visit (INDEPENDENT_AMBULATORY_CARE_PROVIDER_SITE_OTHER): Payer: Medicare Other | Admitting: Pharmacist

## 2016-01-24 DIAGNOSIS — I4891 Unspecified atrial fibrillation: Secondary | ICD-10-CM

## 2016-01-24 LAB — POCT INR: INR: 1.5

## 2016-01-24 MED ORDER — LISINOPRIL 20 MG PO TABS: 20.0000 mg | ORAL_TABLET | Freq: Every day | ORAL | Status: DC

## 2016-01-24 NOTE — Progress Notes (Signed)
Subjective:     Indication: atrial fibrillation Bleeding signs/symptoms: None Thromboembolic signs/symptoms: None  Missed Coumadin doses: None Medication changes: yes - lisinopril increased from 1/2 tablet to 1 tablet per Dr Livia Snellen - see lab notes from 12/07/2015 Dietary changes: yes - per patient has had less cabbage over the last 7 days than usual Bacterial/viral infection: no Other concerns: no  The following portions of the patient's history were reviewed and updated as appropriate: allergies, current medications and problem list.   Objective:    INR Today: 1.5 Current dose: warfarin 10mg  tablets - take 10mg  on Sundays and 5mg  all other days.     Assessment:    Subtherapeutic INR for goal of 2-3   Medication management  Plan:    1. New dose: increase warfarin to 10mg  on sundays and wednesdays; take 5mg  all other days.   2. Next INR: 2 weeks   3.  Med list up dated and Rx sent to pharmacy to reflect increase in lisinopril to 20mg  1 tablet daily  Cherre Robins, PharmD, CPP

## 2016-01-24 NOTE — Patient Instructions (Signed)
Anticoagulation Dose Instructions as of 01/24/2016      Melvin Wang Tue Wed Thu Fri Sat   New Dose 10 mg 5 mg 5 mg 10 mg 5 mg 5 mg 5 mg    Description        Increase dose to Take 1 tablet (10mg ) on Sundays and Wednesdays and 1/2 tablet (5mg ) all other days.       INR was 1.5 today

## 2016-02-12 ENCOUNTER — Ambulatory Visit (INDEPENDENT_AMBULATORY_CARE_PROVIDER_SITE_OTHER): Payer: Medicare Other | Admitting: Pharmacist

## 2016-02-12 DIAGNOSIS — I4891 Unspecified atrial fibrillation: Secondary | ICD-10-CM | POA: Diagnosis not present

## 2016-02-12 LAB — COAGUCHEK XS/INR WAIVED
INR: 2.5 — AB (ref 0.9–1.1)
PROTHROMBIN TIME: 29.8 s

## 2016-02-12 NOTE — Patient Instructions (Signed)
Anticoagulation Dose Instructions as of 02/12/2016      Melvin Wang Tue Wed Thu Fri Sat   New Dose 10 mg 5 mg 5 mg 10 mg 5 mg 5 mg 5 mg    Description        Continue warfarin 10mg  -  1 tablet (10mg ) on Sundays and Wednesdays and 1/2 tablet (5mg ) all other days.       INR was 2.5 today

## 2016-02-22 ENCOUNTER — Encounter: Payer: Self-pay | Admitting: Pediatrics

## 2016-02-22 ENCOUNTER — Ambulatory Visit (INDEPENDENT_AMBULATORY_CARE_PROVIDER_SITE_OTHER): Payer: Medicare Other | Admitting: Pediatrics

## 2016-02-22 VITALS — BP 107/68 | HR 111 | Temp 97.5°F | Ht 74.0 in | Wt 262.6 lb

## 2016-02-22 DIAGNOSIS — Z5181 Encounter for therapeutic drug level monitoring: Secondary | ICD-10-CM

## 2016-02-22 DIAGNOSIS — T148XXA Other injury of unspecified body region, initial encounter: Secondary | ICD-10-CM

## 2016-02-22 DIAGNOSIS — J069 Acute upper respiratory infection, unspecified: Secondary | ICD-10-CM

## 2016-02-22 DIAGNOSIS — R233 Spontaneous ecchymoses: Secondary | ICD-10-CM

## 2016-02-22 DIAGNOSIS — T148 Other injury of unspecified body region: Secondary | ICD-10-CM | POA: Diagnosis not present

## 2016-02-22 LAB — COAGUCHEK XS/INR WAIVED
INR: 3.7 — ABNORMAL HIGH (ref 0.9–1.1)
Prothrombin Time: 44.6 s

## 2016-02-22 NOTE — Progress Notes (Signed)
Subjective:    Patient ID: Melvin Wang, male    DOB: January 16, 1941, 75 y.o.   MRN: [id number]  CC: Bleeding/Bruising   HPI: Melvin Wang is a 75 y.o. male presenting for Bleeding/Bruising  Has had 3 days of URI symptoms, feels better today Wife also sick, but getting better now Coughing a lot Minimally productive Congestion hadnt showered in a couple days because of illness, shower this morning noticed new bruise on L arm and L side that concerned him as he is on warfarin Knot present in the middle of the bruise No lightheadedness or dizziness No fevers Not eating as much as usual over last few days Pt denies any injuries, just the coughing, no pleuritic pain   Depression screen [facility] 2/9 02/22/2016 12/07/2015 09/04/2015 08/21/2015 06/22/2015  Decreased Interest 0 0 0 0 0  Down, Depressed, Hopeless 0 0 0 0 0  PHQ - 2 Score 0 0 0 0 0     Relevant past medical, surgical, family and social history reviewed and updated as indicated. Interim medical history since our last visit reviewed. Allergies and medications reviewed and updated.    ROS: Per HPI unless specifically indicated above  History  Smoking status  . Former Smoker -- 1.00 packs/day for 15 years  . Types: Cigarettes  . Quit date: 07/07/1984  Smokeless tobacco  . Never Used    Past Medical History Patient Active Problem List   Diagnosis Date Noted  . Petechial rash 08/21/2015  . Gout 01/29/2015  . Atrial fibrillation ([place]) 03/23/2013  . Abnormality of gait 03/18/2013  . Tremor, essential 03/18/2013  . Essential hypertension 03/18/2013  . Controlled type 2 diabetes mellitus with diabetic nephropathy ([place]) 03/18/2013      Objective:    BP 107/68 mmHg  Pulse 111  Temp(Src) 97.5 F (36.4 C) (Oral)  Ht 6\' 2"  (1.88 m)  Wt 262 lb 9.6 oz (119.115 kg)  BMI 33.70 kg/m2  Wt Readings from Last 3 Encounters:  02/22/16 262 lb 9.6 oz (119.115 kg)  12/07/15 267 lb (121.11 kg)  11/06/15 270 lb (122.471  kg)    Gen: NAD, alert, cooperative with exam, NCAT EYES: EOMI, no scleral injection or icterus CV: NRRR, normal S1/S2, no murmur, distal pulses 2+ b/l Resp: CTABL, no wheezes, normal WOB, no crackles Abd: +BS, soft, NTND. no guarding or organomegaly Ext: No edema, warm Neuro: Alert and oriented, strength equal b/l UE and LE, coordination grossly normal MSK: normal muscle bulk Skin: L upper arm with small apprx 1 cm nodule surrounded by yellow-purple bruising. R side with similar 1 cm nodule surrounded by purple bruising. Non-tender     Assessment & Plan:    Willow was seen today for bleeding/bruising. INR elevated at 3.7. Likely because appetite has been down recently with URI. Possible that he injured himself without noticing, coughing a lot as well. Hold warfarin tonight. Takes whole 10mg  tab Sun and Wed, other days 5mg  now.   Switch to Wednesday only 10mg  Other days 5mg , decreasing total weekly dose by 5mg  (45mg  total to 40mg ).  Has been on that dosing before and needed increase. He is eating minimally recently due to illness and far fewer greens than usual, likely the cause of the elevated INR. May need increase again in dose.  Diagnoses and all orders for this visit:  Encounter for medication monitoring -     CoaguChek XS/INR Waived  Bruise -     CoaguChek XS/INR Waived  Acute URI Discussed  symptomatic care.    Follow up plan: Return for [facility] in 10-14 days for recheck INR.  Assunta Found, MD [facility] 02/22/2016, 8:33 AM

## 2016-02-23 DIAGNOSIS — Z961 Presence of intraocular lens: Secondary | ICD-10-CM | POA: Diagnosis not present

## 2016-02-23 DIAGNOSIS — D3131 Benign neoplasm of right choroid: Secondary | ICD-10-CM | POA: Diagnosis not present

## 2016-02-23 DIAGNOSIS — H43812 Vitreous degeneration, left eye: Secondary | ICD-10-CM | POA: Diagnosis not present

## 2016-02-23 DIAGNOSIS — E119 Type 2 diabetes mellitus without complications: Secondary | ICD-10-CM | POA: Diagnosis not present

## 2016-02-23 LAB — HM DIABETES EYE EXAM

## 2016-03-11 ENCOUNTER — Encounter: Payer: Self-pay | Admitting: Family Medicine

## 2016-03-11 ENCOUNTER — Ambulatory Visit (INDEPENDENT_AMBULATORY_CARE_PROVIDER_SITE_OTHER): Payer: Medicare Other | Admitting: Family Medicine

## 2016-03-11 VITALS — Temp 97.3°F | Ht 74.0 in | Wt 259.4 lb

## 2016-03-11 DIAGNOSIS — Z794 Long term (current) use of insulin: Secondary | ICD-10-CM

## 2016-03-11 DIAGNOSIS — I10 Essential (primary) hypertension: Secondary | ICD-10-CM

## 2016-03-11 DIAGNOSIS — I4891 Unspecified atrial fibrillation: Secondary | ICD-10-CM

## 2016-03-11 DIAGNOSIS — E1121 Type 2 diabetes mellitus with diabetic nephropathy: Secondary | ICD-10-CM

## 2016-03-11 LAB — BAYER DCA HB A1C WAIVED: HB A1C (BAYER DCA - WAIVED): 6.8 % (ref <7.0)

## 2016-03-11 LAB — COAGUCHEK XS/INR WAIVED
INR: 2.2 — AB (ref 0.9–1.1)
PROTHROMBIN TIME: 26.1 s

## 2016-03-11 NOTE — Progress Notes (Signed)
Subjective:  Patient ID: Melvin Wang, male    DOB: Jan 01, 1941  Age: 75 y.o. MRN: [id number]  CC: Diabetes; Hypertension; and Hyperlipidemia   HPI Melvin Wang presents for  follow-up of hypertension. Patient has no history of headache chest pain or shortness of breath or recent cough. Patient also denies symptoms of TIA such as numbness weakness lateralizing. Patient checks  blood pressure at home and has not had any elevated readings recently. Patient denies side effects from his medication. States taking it regularly.  Patient also  in for follow-up of elevated cholesterol. Doing well without complaints on current medication. Denies side effects of statin including myalgia and arthralgia and nausea. Also in today for liver function testing. Currently no chest pain, shortness of breath or other cardiovascular related symptoms noted.  Follow-up of diabetes. Patient does check blood sugar at home. Readings run between 90 and 140 Log attached Patient denies symptoms such as polyuria, polydipsia, excessive hunger, nausea No significant hypoglycemic spells noted. Medications as noted below. Taking them regularly without complication/adverse reaction being reported today.    Patient in for follow-up of atrial fibrillation. Patient denies any recent bouts of chest pain or palpitations. Additionally, patient is taking anticoagulants. Patient denies any recent excessive bleeding episodes including epistaxis, bleeding from the gums, genitalia, rectal bleeding or hematuria. Additionally there has been no excessive bruising.  History Melvin Wang has a past medical history of Movement disorder; Abnormality of gait; Hypertension; Diabetes mellitus without complication ([place]); Chronic kidney disease; Gout; Dysrhythmia; Cataract; Hyperlipidemia; and Foot ulcer, right ([place]) (03/2013).   He has past surgical history that includes Cataract extraction w/PHACO (Left, 04/19/2013) and Cataract extraction  w/PHACO (Right, 07/15/2013).   His family history includes Cancer in his father and paternal uncle.He reports that he quit smoking about 31 years ago. His smoking use included Cigarettes. He has a 15 pack-year smoking history. He has never used smokeless tobacco. He reports that he does not drink alcohol or use illicit drugs.  Current Outpatient Prescriptions on File Prior to Visit  Medication Sig Dispense Refill  . allopurinol (ZYLOPRIM) 300 MG tablet Take 1 tablet (300 mg total) by mouth daily. 90 tablet 1  . atenolol (TENORMIN) 50 MG tablet TAKE 1 TABLET (50 MG TOTAL) BY MOUTH DAILY. 90 tablet 1  . atorvastatin (LIPITOR) 40 MG tablet TAKE 1 TABLET BY MOUTH AT BEDTIME 90 tablet 1  . B-D UF III MINI PEN NEEDLES 31G X 5 MM MISC USE 1 TIME DAILY WITH LANTUS SOLOSTAR 100 each 2  . calcitRIOL (ROCALTROL) 0.5 MCG capsule take 1 capsule every other day    . furosemide (LASIX) 80 MG tablet TAKE 2 TABLETS (160 MG TOTAL) BY MOUTH 2 (TWO) TIMES DAILY. 360 tablet 1  . lisinopril (PRINIVIL,ZESTRIL) 20 MG tablet Take 1 tablet (20 mg total) by mouth daily. 90 tablet 0  . ONE TOUCH ULTRA TEST test strip CHECH BLOOD SUGAR TWICE A DAY 100 each 1  . warfarin (COUMADIN) 10 MG tablet Take as directed by anticoagulation dose - 1/2 to 1 tablet daily 90 tablet 1  . Insulin Glargine (LANTUS SOLOSTAR) 100 UNIT/ML Solostar Pen Inject 40 Units into the skin every morning. 5 pen PRN   No current facility-administered medications on file prior to visit.    ROS Review of Systems  Constitutional: Negative for fever, chills, diaphoresis and unexpected weight change.  HENT: Negative for congestion, hearing loss, rhinorrhea and sore throat.   Eyes: Negative for visual disturbance.  Respiratory: Negative for  cough and shortness of breath.   Cardiovascular: Negative for chest pain.  Gastrointestinal: Negative for abdominal pain, diarrhea and constipation.  Genitourinary: Negative for dysuria and flank pain.    Musculoskeletal: Negative for joint swelling and arthralgias.  Skin: Negative for rash.  Neurological: Negative for dizziness and headaches.  Psychiatric/Behavioral: Negative for sleep disturbance and dysphoric mood.    Objective:  Temp(Src) 97.3 F (36.3 C) (Oral)  Ht '6\' 2"'$  (1.88 m)  Wt 259 lb 6.4 oz (117.663 kg)  BMI 33.29 kg/m2  SpO2 95%  BP Readings from Last 3 Encounters:  02/22/16 107/68  12/07/15 104/63  08/21/15 136/74    Wt Readings from Last 3 Encounters:  03/11/16 259 lb 6.4 oz (117.663 kg)  02/22/16 262 lb 9.6 oz (119.115 kg)  12/07/15 267 lb (121.11 kg)     Physical Exam  Constitutional: He is oriented to person, place, and time. He appears well-developed and well-nourished. No distress.  HENT:  Head: Normocephalic and atraumatic.  Right Ear: External ear normal.  Left Ear: External ear normal.  Nose: Nose normal.  Mouth/Throat: Oropharynx is clear and moist.  Eyes: Conjunctivae and EOM are normal. Pupils are equal, round, and reactive to light.  Neck: Normal range of motion. Neck supple. No thyromegaly present.  Cardiovascular: Normal rate, regular rhythm and normal heart sounds.   No murmur heard. Pulmonary/Chest: Effort normal and breath sounds normal. No respiratory distress. He has no wheezes. He has no rales.  Abdominal: Soft. Bowel sounds are normal. He exhibits no distension. There is no tenderness.  Lymphadenopathy:    He has no cervical adenopathy.  Neurological: He is alert and oriented to person, place, and time. He has normal reflexes.  Skin: Skin is warm and dry.  Psychiatric: He has a normal mood and affect. His behavior is normal. Judgment and thought content normal.    Lab Results  Component Value Date   HGBA1C 7.0 12/07/2015   HGBA1C 7.2 09/04/2015   HGBA1C 7.0 05/05/2015    Lab Results  Component Value Date   WBC 11.7* 12/07/2015   HGB 12.6* 06/22/2015   HCT 37.8 12/07/2015   PLT 219 12/07/2015   GLUCOSE 130* 12/07/2015    CHOL 94* 12/07/2015   TRIG 107 12/07/2015   HDL 35* 12/07/2015   LDLCALC 38 12/07/2015   ALT 27 12/07/2015   AST 21 12/07/2015   NA 137 12/07/2015   K 3.9 12/07/2015   CL 92* 12/07/2015   CREATININE 1.30* 12/07/2015   BUN 47* 12/07/2015   CO2 24 12/07/2015   TSH 1.630 12/07/2015   PSA 2.2 08/26/2014   INR 2.2* 03/11/2016   HGBA1C 7.0 12/07/2015    No results found.  Assessment & Plan:   Len was seen today for diabetes, hypertension and hyperlipidemia.  Diagnoses and all orders for this visit:  Controlled type 2 diabetes mellitus with diabetic nephropathy, with long-term current use of insulin ([place]) -     Bayer DCA Hb A1c Waived -     CMP14+EGFR -     Lipid panel; Standing  Atrial fibrillation, unspecified type ([place]) -     CoaguChek XS/INR Waived  Essential hypertension -     CBC with Differential/Platelet -     CMP14+EGFR -     Lipid panel; Standing   I am having Mr. Domagala maintain his calcitRIOL, warfarin, ONE TOUCH ULTRA TEST, B-D UF III MINI PEN NEEDLES, Insulin Glargine, allopurinol, furosemide, atenolol, atorvastatin, and lisinopril.  No orders of the defined types  were placed in this encounter.     Follow-up: Return in about 3 months (around 06/10/2016).  Claretta Fraise, M.D.

## 2016-03-12 ENCOUNTER — Telehealth: Payer: Self-pay | Admitting: Family Medicine

## 2016-03-12 ENCOUNTER — Other Ambulatory Visit: Payer: Self-pay | Admitting: Family Medicine

## 2016-03-12 LAB — CBC WITH DIFFERENTIAL/PLATELET
BASOS ABS: 0.1 10*3/uL (ref 0.0–0.2)
BASOS: 1 %
EOS (ABSOLUTE): 0.1 10*3/uL (ref 0.0–0.4)
EOS: 1 %
Hematocrit: 36.5 % — ABNORMAL LOW (ref 37.5–51.0)
Hemoglobin: 12 g/dL — ABNORMAL LOW (ref 12.6–17.7)
IMMATURE GRANULOCYTES: 0 %
Immature Grans (Abs): 0 10*3/uL (ref 0.0–0.1)
LYMPHS: 14 %
Lymphocytes Absolute: 1.7 10*3/uL (ref 0.7–3.1)
MCH: 32.8 pg (ref 26.6–33.0)
MCHC: 32.9 g/dL (ref 31.5–35.7)
MCV: 100 fL — ABNORMAL HIGH (ref 79–97)
MONOS ABS: 1.1 10*3/uL — AB (ref 0.1–0.9)
Monocytes: 9 %
NEUTROS ABS: 9.1 10*3/uL — AB (ref 1.4–7.0)
Neutrophils: 75 %
Platelets: 210 10*3/uL (ref 150–379)
RBC: 3.66 x10E6/uL — AB (ref 4.14–5.80)
RDW: 16.3 % — AB (ref 12.3–15.4)
WBC: 12.1 10*3/uL — ABNORMAL HIGH (ref 3.4–10.8)

## 2016-03-12 LAB — CMP14+EGFR
ALK PHOS: 209 IU/L — AB (ref 39–117)
ALT: 39 IU/L (ref 0–44)
AST: 26 IU/L (ref 0–40)
Albumin/Globulin Ratio: 1.1 — ABNORMAL LOW (ref 1.2–2.2)
Albumin: 3.9 g/dL (ref 3.5–4.8)
BUN/Creatinine Ratio: 38 — ABNORMAL HIGH (ref 10–24)
BUN: 66 mg/dL — AB (ref 8–27)
Bilirubin Total: 1.3 mg/dL — ABNORMAL HIGH (ref 0.0–1.2)
CALCIUM: 9.3 mg/dL (ref 8.6–10.2)
CO2: 25 mmol/L (ref 18–29)
CREATININE: 1.74 mg/dL — AB (ref 0.76–1.27)
Chloride: 94 mmol/L — ABNORMAL LOW (ref 96–106)
GFR calc Af Amer: 44 mL/min/{1.73_m2} — ABNORMAL LOW (ref >59)
GFR, EST NON AFRICAN AMERICAN: 38 mL/min/{1.73_m2} — AB (ref >59)
GLUCOSE: 122 mg/dL — AB (ref 65–99)
Globulin, Total: 3.6 g/dL (ref 1.5–4.5)
Potassium: 4.7 mmol/L (ref 3.5–5.2)
SODIUM: 138 mmol/L (ref 134–144)
Total Protein: 7.5 g/dL (ref 6.0–8.5)

## 2016-03-13 NOTE — Telephone Encounter (Signed)
Looks like he saw Dr Livia Snellen 03/11/16.  I was not here yestereday 03/12/16 but it appears [facility] was called [facility]/12/16.  Left message for [facility].

## 2016-03-13 NOTE — Telephone Encounter (Signed)
Confirmed with patient he has received warfarin rx.

## 2016-03-18 ENCOUNTER — Other Ambulatory Visit: Payer: Medicare Other

## 2016-03-18 DIAGNOSIS — E1121 Type 2 diabetes mellitus with diabetic nephropathy: Secondary | ICD-10-CM | POA: Diagnosis not present

## 2016-03-18 DIAGNOSIS — I10 Essential (primary) hypertension: Secondary | ICD-10-CM | POA: Diagnosis not present

## 2016-03-18 DIAGNOSIS — Z794 Long term (current) use of insulin: Secondary | ICD-10-CM | POA: Diagnosis not present

## 2016-03-18 DIAGNOSIS — N289 Disorder of kidney and ureter, unspecified: Secondary | ICD-10-CM

## 2016-03-19 LAB — BMP8+EGFR
BUN / CREAT RATIO: 36 — AB (ref 10–24)
BUN: 48 mg/dL — ABNORMAL HIGH (ref 8–27)
CO2: 20 mmol/L (ref 18–29)
CREATININE: 1.32 mg/dL — AB (ref 0.76–1.27)
Calcium: 9.1 mg/dL (ref 8.6–10.2)
Chloride: 97 mmol/L (ref 96–106)
GFR calc non Af Amer: 54 mL/min/{1.73_m2} — ABNORMAL LOW (ref >59)
GFR, EST AFRICAN AMERICAN: 62 mL/min/{1.73_m2} (ref >59)
GLUCOSE: 105 mg/dL — AB (ref 65–99)
Potassium: 4.6 mmol/L (ref 3.5–5.2)
Sodium: 136 mmol/L (ref 134–144)

## 2016-03-19 LAB — LIPID PANEL
CHOLESTEROL TOTAL: 89 mg/dL — AB (ref 100–199)
Chol/HDL Ratio: 2.7 ratio units (ref 0.0–5.0)
HDL: 33 mg/dL — ABNORMAL LOW (ref >39)
LDL CALC: 36 mg/dL (ref 0–99)
Triglycerides: 101 mg/dL (ref 0–149)
VLDL CHOLESTEROL CAL: 20 mg/dL (ref 5–40)

## 2016-04-01 ENCOUNTER — Other Ambulatory Visit: Payer: Medicare Other

## 2016-04-01 DIAGNOSIS — N289 Disorder of kidney and ureter, unspecified: Secondary | ICD-10-CM | POA: Diagnosis not present

## 2016-04-02 DIAGNOSIS — N183 Chronic kidney disease, stage 3 (moderate): Secondary | ICD-10-CM | POA: Diagnosis not present

## 2016-04-02 DIAGNOSIS — D631 Anemia in chronic kidney disease: Secondary | ICD-10-CM | POA: Diagnosis not present

## 2016-04-02 DIAGNOSIS — E1122 Type 2 diabetes mellitus with diabetic chronic kidney disease: Secondary | ICD-10-CM | POA: Diagnosis not present

## 2016-04-02 DIAGNOSIS — I129 Hypertensive chronic kidney disease with stage 1 through stage 4 chronic kidney disease, or unspecified chronic kidney disease: Secondary | ICD-10-CM | POA: Diagnosis not present

## 2016-04-02 DIAGNOSIS — N2581 Secondary hyperparathyroidism of renal origin: Secondary | ICD-10-CM | POA: Diagnosis not present

## 2016-04-02 DIAGNOSIS — M1A20X1 Drug-induced chronic gout, unspecified site, with tophus (tophi): Secondary | ICD-10-CM | POA: Diagnosis not present

## 2016-04-02 LAB — BASIC METABOLIC PANEL
BUN/Creatinine Ratio: 32 — ABNORMAL HIGH (ref 10–24)
BUN: 42 mg/dL — AB (ref 8–27)
CALCIUM: 9 mg/dL (ref 8.6–10.2)
CHLORIDE: 95 mmol/L — AB (ref 96–106)
CO2: 22 mmol/L (ref 18–29)
CREATININE: 1.32 mg/dL — AB (ref 0.76–1.27)
GFR calc Af Amer: 62 mL/min/{1.73_m2} (ref >59)
GFR calc non Af Amer: 54 mL/min/{1.73_m2} — ABNORMAL LOW (ref >59)
GLUCOSE: 104 mg/dL — AB (ref 65–99)
POTASSIUM: 4.3 mmol/L (ref 3.5–5.2)
Sodium: 135 mmol/L (ref 134–144)

## 2016-04-22 ENCOUNTER — Ambulatory Visit (INDEPENDENT_AMBULATORY_CARE_PROVIDER_SITE_OTHER): Payer: Medicare Other | Admitting: Pharmacist

## 2016-04-22 DIAGNOSIS — I4891 Unspecified atrial fibrillation: Secondary | ICD-10-CM | POA: Diagnosis not present

## 2016-04-22 LAB — COAGUCHEK XS/INR WAIVED
INR: 3.1 — ABNORMAL HIGH (ref 0.9–1.1)
PROTHROMBIN TIME: 36.8 s

## 2016-04-22 NOTE — Patient Instructions (Signed)
Anticoagulation Dose Instructions as of 04/22/2016      Melvin Wang Tue Wed Thu Fri Sat   New Dose 10 mg 5 mg 5 mg 5 mg 5 mg 5 mg 5 mg    Description        No warfarin tonight - Monday, May 15th.  Continue warfarin 10mg  -  1 tablet (10mg ) on Sundays  and 1/2 tablet (5mg ) all other days.       INR was 3.1 today

## 2016-05-03 ENCOUNTER — Other Ambulatory Visit: Payer: Self-pay | Admitting: Family Medicine

## 2016-05-07 ENCOUNTER — Other Ambulatory Visit: Payer: Self-pay | Admitting: Family Medicine

## 2016-05-13 ENCOUNTER — Other Ambulatory Visit: Payer: Medicare Other

## 2016-05-13 DIAGNOSIS — D631 Anemia in chronic kidney disease: Secondary | ICD-10-CM | POA: Diagnosis not present

## 2016-05-13 DIAGNOSIS — N2581 Secondary hyperparathyroidism of renal origin: Secondary | ICD-10-CM | POA: Diagnosis not present

## 2016-05-13 DIAGNOSIS — E1122 Type 2 diabetes mellitus with diabetic chronic kidney disease: Secondary | ICD-10-CM | POA: Diagnosis not present

## 2016-05-13 DIAGNOSIS — N183 Chronic kidney disease, stage 3 (moderate): Secondary | ICD-10-CM | POA: Diagnosis not present

## 2016-05-13 DIAGNOSIS — I129 Hypertensive chronic kidney disease with stage 1 through stage 4 chronic kidney disease, or unspecified chronic kidney disease: Secondary | ICD-10-CM | POA: Diagnosis not present

## 2016-05-23 ENCOUNTER — Ambulatory Visit (INDEPENDENT_AMBULATORY_CARE_PROVIDER_SITE_OTHER): Payer: Medicare Other | Admitting: Pharmacist

## 2016-05-23 DIAGNOSIS — I4891 Unspecified atrial fibrillation: Secondary | ICD-10-CM

## 2016-05-23 LAB — COAGUCHEK XS/INR WAIVED
INR: 3.4 — ABNORMAL HIGH (ref 0.9–1.1)
PROTHROMBIN TIME: 41.1 s

## 2016-05-23 NOTE — Patient Instructions (Signed)
Anticoagulation Dose Instructions as of 05/23/2016      Melvin Wang Tue Wed Thu Fri Sat   New Dose 5 mg 5 mg 5 mg 5 mg 5 mg 5 mg 5 mg    Description        No warfarin today - Thursdays, June 15th.  Then decreased to 10mg  take 1/2 tablet once a day     INR was 3.4 today

## 2016-05-25 ENCOUNTER — Encounter: Payer: Self-pay | Admitting: Family Medicine

## 2016-05-25 ENCOUNTER — Ambulatory Visit (INDEPENDENT_AMBULATORY_CARE_PROVIDER_SITE_OTHER): Payer: Medicare Other | Admitting: Family Medicine

## 2016-05-25 VITALS — BP 101/60 | HR 96 | Temp 96.8°F | Ht 74.0 in | Wt 257.0 lb

## 2016-05-25 DIAGNOSIS — R1013 Epigastric pain: Secondary | ICD-10-CM

## 2016-05-25 MED ORDER — OMEPRAZOLE 20 MG PO CPDR: 20.0000 mg | DELAYED_RELEASE_CAPSULE | Freq: Every day | ORAL | Status: DC

## 2016-05-25 NOTE — Progress Notes (Signed)
BP 101/60 mmHg  Pulse 96  Temp(Src) 96.8 F (36 C) (Oral)  Ht _0  (1.88 m)  Wt 257 lb (116.574 kg)  BMI 32.98 kg/m2   Subjective:    Patient ID: Melvin Wang, male    DOB: 1941/08/16, 75 y.o.   MRN: [id number]  HPI: Melvin Wang is a 75 y.o. male presenting on 05/25/2016 for Gastroesophageal Reflux; Abdominal Pain; and tooth problem   HPI GERD and epigastric abdominal pain Patient comes in with epigastric abdominal pain and belching and burping and a burning sensation in that region and sometimes going up into his chest. This been going on for about a week. Thought it would get better with gas medications but did not improve. He has never quite had it like this before previously. He denies any fevers or chills or diarrhea or constipation or blood in his stool.  Relevant past medical, surgical, family and social history reviewed and updated as indicated. Interim medical history since our last visit reviewed. Allergies and medications reviewed and updated.  Review of Systems  Constitutional: Negative for fever and chills.  HENT: Negative for ear discharge and ear pain.   Eyes: Negative for discharge and visual disturbance.  Respiratory: Negative for shortness of breath and wheezing.   Cardiovascular: Negative for chest pain and leg swelling.  Gastrointestinal: Positive for abdominal pain. Negative for nausea, vomiting, diarrhea, constipation and blood in stool.  Genitourinary: Negative for difficulty urinating.  Musculoskeletal: Negative for back pain and gait problem.  Skin: Negative for rash.  Neurological: Negative for syncope, light-headedness and headaches.  All other systems reviewed and are negative.   Per HPI unless specifically indicated above     Medication List       This list is accurate as of: 05/25/16  8:29 AM.  Always use your most recent med list.               allopurinol 300 MG tablet  Commonly known as:  ZYLOPRIM  TAKE 1 TABLET (300 MG  TOTAL) BY MOUTH DAILY.     atenolol 50 MG tablet  Commonly known as:  TENORMIN  TAKE 1 TABLET (50 MG TOTAL) BY MOUTH DAILY.     atorvastatin 40 MG tablet  Commonly known as:  LIPITOR  TAKE 1 TABLET BY MOUTH AT BEDTIME     B-D UF III MINI PEN NEEDLES 31G X 5 MM Misc  Generic drug:  Insulin Pen Needle  USE 1 TIME DAILY WITH LANTUS SOLOSTAR     calcitRIOL 0.5 MCG capsule  Commonly known as:  ROCALTROL  take 1 capsule every other day     furosemide 80 MG tablet  Commonly known as:  LASIX  TAKE 2 TABLETS (160 MG TOTAL) BY MOUTH 2 (TWO) TIMES DAILY.     Insulin Glargine 100 UNIT/ML Solostar Pen  Commonly known as:  LANTUS SOLOSTAR  Inject 40 Units into the skin every morning.     lisinopril 20 MG tablet  Commonly known as:  PRINIVIL,ZESTRIL  Take 0.5 tablets (10 mg total) by mouth daily.     omeprazole 20 MG capsule  Commonly known as:  PRILOSEC  Take 1 capsule (20 mg total) by mouth daily.     ONE TOUCH ULTRA TEST test strip  Generic drug:  glucose blood  CHECH BLOOD SUGAR TWICE A DAY     warfarin 10 MG tablet  Commonly known as:  COUMADIN  Take as directed by anticoagulation dose - 1/2 to 1 tablet  daily           Objective:    BP 101/60 mmHg  Pulse 96  Temp(Src) 96.8 F (36 C) (Oral)  Ht _0  (1.88 m)  Wt 257 lb (116.574 kg)  BMI 32.98 kg/m2  Wt Readings from Last 3 Encounters:  05/25/16 257 lb (116.574 kg)  03/11/16 259 lb 6.4 oz (117.663 kg)  02/22/16 262 lb 9.6 oz (119.115 kg)    Physical Exam  Constitutional: He is oriented to person, place, and time. He appears well-developed and well-nourished. No distress.  Eyes: Conjunctivae and EOM are normal. Pupils are equal, round, and reactive to light. Right eye exhibits no discharge. No scleral icterus.  Cardiovascular: Normal rate, regular rhythm, normal heart sounds and intact distal pulses.   No murmur heard. Pulmonary/Chest: Effort normal and breath sounds normal. No respiratory distress. He has no  wheezes.  Abdominal: Soft. Bowel sounds are normal. He exhibits no distension. There is no hepatosplenomegaly. There is tenderness in the epigastric area. There is no rigidity, no rebound, no guarding, no CVA tenderness, no tenderness at McBurney's point and negative Murphy's sign.  Musculoskeletal: Normal range of motion. He exhibits no edema.  Neurological: He is alert and oriented to person, place, and time. Coordination normal.  Skin: Skin is warm and dry. No rash noted. He is not diaphoretic.  Psychiatric: He has a normal mood and affect. His behavior is normal.  Nursing note and vitals reviewed.     Assessment & Plan:   Problem List Items Addressed This Visit    None    Visit Diagnoses    Abdominal pain, epigastric    -  Primary    Been going on for one week, likely GERD because has belching, will tests liver and pain is just to make sure    Relevant Orders    Lipase    CMP14+EGFR        Follow up plan: Return if symptoms worsen or fail to improve.  Counseling provided for all of the vaccine components Orders Placed This Encounter  Procedures  . Lipase  . Richardson Brei Pociask, MD [facility] 05/25/2016, 8:29 AM

## 2016-05-27 ENCOUNTER — Other Ambulatory Visit: Payer: Self-pay | Admitting: Family Medicine

## 2016-05-27 ENCOUNTER — Other Ambulatory Visit: Payer: Self-pay | Admitting: Pharmacist

## 2016-05-28 ENCOUNTER — Ambulatory Visit: Payer: Medicare Other

## 2016-05-30 DIAGNOSIS — L84 Corns and callosities: Secondary | ICD-10-CM | POA: Diagnosis not present

## 2016-05-30 DIAGNOSIS — E1142 Type 2 diabetes mellitus with diabetic polyneuropathy: Secondary | ICD-10-CM | POA: Diagnosis not present

## 2016-05-30 DIAGNOSIS — B351 Tinea unguium: Secondary | ICD-10-CM | POA: Diagnosis not present

## 2016-06-04 ENCOUNTER — Other Ambulatory Visit: Payer: Self-pay | Admitting: Family Medicine

## 2016-06-14 ENCOUNTER — Other Ambulatory Visit: Payer: Self-pay | Admitting: Family Medicine

## 2016-06-18 ENCOUNTER — Ambulatory Visit (INDEPENDENT_AMBULATORY_CARE_PROVIDER_SITE_OTHER): Payer: Medicare Other | Admitting: Family Medicine

## 2016-06-18 ENCOUNTER — Encounter: Payer: Self-pay | Admitting: Family Medicine

## 2016-06-18 VITALS — BP 101/64 | HR 92 | Temp 97.3°F | Ht 74.0 in | Wt 255.4 lb

## 2016-06-18 DIAGNOSIS — M1A09X0 Idiopathic chronic gout, multiple sites, without tophus (tophi): Secondary | ICD-10-CM

## 2016-06-18 DIAGNOSIS — E785 Hyperlipidemia, unspecified: Secondary | ICD-10-CM | POA: Insufficient documentation

## 2016-06-18 DIAGNOSIS — I4891 Unspecified atrial fibrillation: Secondary | ICD-10-CM | POA: Diagnosis not present

## 2016-06-18 DIAGNOSIS — Z794 Long term (current) use of insulin: Secondary | ICD-10-CM | POA: Diagnosis not present

## 2016-06-18 DIAGNOSIS — E1121 Type 2 diabetes mellitus with diabetic nephropathy: Secondary | ICD-10-CM

## 2016-06-18 LAB — BAYER DCA HB A1C WAIVED: HB A1C: 7 % — AB (ref <7.0)

## 2016-06-18 LAB — COAGUCHEK XS/INR WAIVED
INR: 2.2 — AB (ref 0.9–1.1)
Prothrombin Time: 26.6 s

## 2016-06-18 NOTE — Progress Notes (Signed)
Subjective:  Patient ID: Melvin Wang, male    DOB: 08/18/1941  Age: 75 y.o. MRN: [id number]  CC: Diabetes; Hyperlipidemia; and Hypertension   HPI Melvin Wang presents for  follow-up of hypertension. Patient has no history of headache chest pain or shortness of breath or recent cough. Patient also denies symptoms of TIA such as numbness weakness lateralizing. Patient checks  blood pressure at home and has not had any elevated readings recently. Patient denies side effects from his medication. States taking it regularly.  Patient also  in for follow-up of elevated cholesterol. Doing well without complaints on current medication. Denies side effects of statin including myalgia and arthralgia and nausea. Also in today for liver function testing. Currently no chest pain, shortness of breath or other cardiovascular related symptoms noted.  Follow-up of diabetes. Patient does check blood sugar at home. Readings run between 90 and 140 Patient denies symptoms such as polyuria, polydipsia, excessive hunger, nausea No significant hypoglycemic spells noted. Medications as noted below. Taking them regularly without complication/adverse reaction being reported today.   Patient also taking allopurinol for chronic gout. He'll have an occasional flare for a day once a month or so if it's a bad month. Pain varies from joints primarily in the toes.   Patient in for follow-up of atrial fibrillation. Patient denies any recent bouts of chest pain or palpitations. Additionally, patient is taking anticoagulants. Patient denies any recent excessive bleeding episodes including epistaxis, bleeding from the gums, genitalia, rectal bleeding or hematuria. Additionally there has been no excessive bruising.  Patient in for follow-up of GERD. Currently asymptomatic taking  PPI daily. There is no chest pain or heartburn. No hematemesis and no melena. No dysphagia or choking. Onset is remote. Progression is stable.  Complicating factors, none.  History Melvin Wang has a past medical history of Movement disorder; Abnormality of gait; Hypertension; Diabetes mellitus without complication ([place]); Chronic kidney disease; Gout; Dysrhythmia; Cataract; Hyperlipidemia; and Foot ulcer, right ([place]) (03/2013).   He has past surgical history that includes Cataract extraction w/PHACO (Left, 04/19/2013) and Cataract extraction w/PHACO (Right, 07/15/2013).   His family history includes Cancer in his father and paternal uncle.He reports that he quit smoking about 31 years ago. His smoking use included Cigarettes. He has a 15 pack-year smoking history. He has never used smokeless tobacco. He reports that he does not drink alcohol or use illicit drugs.  Current Outpatient Prescriptions on File Prior to Visit  Medication Sig Dispense Refill  . allopurinol (ZYLOPRIM) 300 MG tablet TAKE 1 TABLET (300 MG TOTAL) BY MOUTH DAILY. 90 tablet 0  . atenolol (TENORMIN) 50 MG tablet TAKE 1 TABLET (50 MG TOTAL) BY MOUTH DAILY. 90 tablet 1  . atorvastatin (LIPITOR) 40 MG tablet TAKE 1 TABLET BY MOUTH AT BEDTIME 90 tablet 1  . B-D UF III MINI PEN NEEDLES 31G X 5 MM MISC USE 1 TIME DAILY WITH LANTUS SOLOSTAR 100 each 2  . calcitRIOL (ROCALTROL) 0.5 MCG capsule take 1 capsule every other day    . furosemide (LASIX) 80 MG tablet TAKE 2 TABLETS (160 MG TOTAL) BY MOUTH 2 (TWO) TIMES DAILY. 360 tablet 0  . Insulin Glargine (LANTUS SOLOSTAR) 100 UNIT/ML Solostar Pen Inject 40 Units into the skin every morning. 5 pen PRN  . lisinopril (PRINIVIL,ZESTRIL) 20 MG tablet Take 0.5 tablets (10 mg total) by mouth daily. 45 tablet 0  . omeprazole (PRILOSEC) 20 MG capsule Take 1 capsule (20 mg total) by mouth daily. 30 capsule 1  .  ONE TOUCH ULTRA TEST test strip USE TO CHECK BG UP TO 4 TIMES A DAY 400 each 1  . warfarin (COUMADIN) 10 MG tablet Take as directed by anticoagulation dose - 1/2 to 1 tablet daily 90 tablet 1   No current facility-administered  medications on file prior to visit.    ROS Review of Systems  Constitutional: Negative for fever, chills, diaphoresis and unexpected weight change.  HENT: Negative for congestion, hearing loss, rhinorrhea and sore throat.   Eyes: Negative for visual disturbance.  Respiratory: Negative for cough and shortness of breath.   Cardiovascular: Negative for chest pain.  Gastrointestinal: Positive for diarrhea (occasional loose bowel monts. He's had some tick bites he wonders if the tick bites or causing the diarrhea. He denies having had any rash). Negative for abdominal pain and constipation.  Genitourinary: Negative for dysuria and flank pain.  Musculoskeletal: Negative for joint swelling and arthralgias.  Skin: Negative for rash.  Neurological: Positive for numbness (in feet. Recent exam by podiatry, Melvin Wang was nml. per patient). Negative for dizziness and headaches.  Psychiatric/Behavioral: Negative for sleep disturbance and dysphoric mood.    Objective:  BP 101/64 mmHg  Pulse 92  Temp(Src) 97.3 F (36.3 C) (Oral)  Ht 6' 2"  (1.88 m)  Wt 255 lb 6.4 oz (115.849 kg)  BMI 32.78 kg/m2  SpO2 97%  BP Readings from Last 3 Encounters:  06/18/16 101/64  05/25/16 101/60  02/22/16 107/68    Wt Readings from Last 3 Encounters:  06/18/16 255 lb 6.4 oz (115.849 kg)  05/25/16 257 lb (116.574 kg)  03/11/16 259 lb 6.4 oz (117.663 kg)     Physical Exam  Constitutional: He is oriented to person, place, and time. He appears well-developed and well-nourished. No distress.  HENT:  Head: Normocephalic and atraumatic.  Right Ear: External ear normal.  Left Ear: External ear normal.  Nose: Nose normal.  Mouth/Throat: Oropharynx is clear and moist.  Eyes: Conjunctivae and EOM are normal. Pupils are equal, round, and reactive to light.  Neck: Normal range of motion. Neck supple. No thyromegaly present.  Cardiovascular: Normal rate, regular rhythm and normal heart sounds.   No murmur  heard. Pulmonary/Chest: Effort normal and breath sounds normal. No respiratory distress. He has no wheezes. He has no rales.  Abdominal: Soft. Bowel sounds are normal. He exhibits no distension. There is no tenderness.  Lymphadenopathy:    He has no cervical adenopathy.  Neurological: He is alert and oriented to person, place, and time. He has normal reflexes.  Skin: Skin is warm and dry.  Psychiatric: He has a normal mood and affect. His behavior is normal. Judgment and thought content normal.    Lab Results  Component Value Date   HGBA1C 7.0 12/07/2015   HGBA1C 7.2 09/04/2015   HGBA1C 7.0 05/05/2015    Lab Results  Component Value Date   WBC 12.1* 03/11/2016   HGB 12.6* 06/22/2015   HCT 36.5* 03/11/2016   PLT 210 03/11/2016   GLUCOSE 104* 04/01/2016   CHOL 89* 03/18/2016   TRIG 101 03/18/2016   HDL 33* 03/18/2016   LDLCALC 36 03/18/2016   ALT 39 03/11/2016   AST 26 03/11/2016   NA 135 04/01/2016   K 4.3 04/01/2016   CL 95* 04/01/2016   CREATININE 1.32* 04/01/2016   BUN 42* 04/01/2016   CO2 22 04/01/2016   TSH 1.630 12/07/2015   PSA 2.2 08/26/2014   INR 3.4* 05/23/2016   HGBA1C 7.0 12/07/2015    INR  today is 2.2. Patient was informed in the office and told to continue Coumadin as is with standard precautions.  Assessment & Plan:   Melvin Wang was seen today for diabetes, hyperlipidemia and hypertension.  Diagnoses and all orders for this visit:  Controlled type 2 diabetes mellitus with diabetic nephropathy, with long-term current use of insulin ([place]) -     Bayer DCA Hb A1c Waived -     BMP8+EGFR  Atrial fibrillation, unspecified type ([facility]) -     CoaguChek XS/INR Waived -     CoaguChek XS/INR Waived  Hyperlipemia  Idiopathic chronic gout of multiple sites without tophus -     Uric acid  Atrial fibrillation, unspecified   I am having Melvin Wang maintain his calcitRIOL, warfarin, Insulin Glargine, atorvastatin, lisinopril, furosemide, allopurinol,  omeprazole, ONE TOUCH ULTRA TEST, B-D UF III MINI PEN NEEDLES, and atenolol.  No orders of the defined types were placed in this encounter.   Patient recently had eye exam by local optometrist. He denies any diabetic retinopathy.  Foot exam completed by Melvin Wang. He feels his pain from the diabetic neuropathy is not sufficient to merit use of medication. Continue medications without change. Regular exercise and diet using low-carb/complex carb and a Mediterranean diet approach recommended.  Continue to monitor for signs of excessive bruising or bleeding.  Report changes in joint pain particularly accompanied by redness and swelling. Follow-up: Return in about 3 months (around 09/18/2016) for diabetes.  Claretta Fraise, M.D.

## 2016-06-19 LAB — BMP8+EGFR
BUN/Creatinine Ratio: 39 — ABNORMAL HIGH (ref 10–24)
BUN: 54 mg/dL — AB (ref 8–27)
CALCIUM: 9.1 mg/dL (ref 8.6–10.2)
CHLORIDE: 91 mmol/L — AB (ref 96–106)
CO2: 24 mmol/L (ref 18–29)
Creatinine, Ser: 1.38 mg/dL — ABNORMAL HIGH (ref 0.76–1.27)
GFR calc Af Amer: 58 mL/min/{1.73_m2} — ABNORMAL LOW (ref >59)
GFR calc non Af Amer: 50 mL/min/{1.73_m2} — ABNORMAL LOW (ref >59)
GLUCOSE: 104 mg/dL — AB (ref 65–99)
Potassium: 3.8 mmol/L (ref 3.5–5.2)
Sodium: 137 mmol/L (ref 134–144)

## 2016-06-19 LAB — URIC ACID: URIC ACID: 5.1 mg/dL (ref 3.7–8.6)

## 2016-06-24 ENCOUNTER — Ambulatory Visit: Payer: Self-pay | Admitting: Pharmacist

## 2016-06-24 NOTE — Progress Notes (Signed)
Opened in error

## 2016-07-05 ENCOUNTER — Other Ambulatory Visit: Payer: Self-pay | Admitting: Family Medicine

## 2016-07-19 ENCOUNTER — Other Ambulatory Visit: Payer: Self-pay | Admitting: Family Medicine

## 2016-07-23 ENCOUNTER — Ambulatory Visit (INDEPENDENT_AMBULATORY_CARE_PROVIDER_SITE_OTHER): Payer: Medicare Other | Admitting: Pharmacist

## 2016-07-23 VITALS — BP 108/60

## 2016-07-23 DIAGNOSIS — Z794 Long term (current) use of insulin: Secondary | ICD-10-CM | POA: Diagnosis not present

## 2016-07-23 DIAGNOSIS — E1121 Type 2 diabetes mellitus with diabetic nephropathy: Secondary | ICD-10-CM

## 2016-07-23 DIAGNOSIS — I4891 Unspecified atrial fibrillation: Secondary | ICD-10-CM

## 2016-07-23 LAB — COAGUCHEK XS/INR WAIVED
INR: 3 — AB (ref 0.9–1.1)
PROTHROMBIN TIME: 36.2 s

## 2016-07-23 LAB — GLUCOSE HEMOCUE WAIVED: GLU HEMOCUE WAIVED: 143 mg/dL — AB (ref 65–99)

## 2016-08-02 ENCOUNTER — Other Ambulatory Visit: Payer: Self-pay | Admitting: Family Medicine

## 2016-08-05 ENCOUNTER — Other Ambulatory Visit: Payer: Self-pay | Admitting: Family Medicine

## 2016-08-16 DIAGNOSIS — N183 Chronic kidney disease, stage 3 (moderate): Secondary | ICD-10-CM | POA: Diagnosis not present

## 2016-08-16 DIAGNOSIS — N2581 Secondary hyperparathyroidism of renal origin: Secondary | ICD-10-CM | POA: Diagnosis not present

## 2016-08-16 DIAGNOSIS — D631 Anemia in chronic kidney disease: Secondary | ICD-10-CM | POA: Diagnosis not present

## 2016-08-16 DIAGNOSIS — E1122 Type 2 diabetes mellitus with diabetic chronic kidney disease: Secondary | ICD-10-CM | POA: Diagnosis not present

## 2016-08-16 DIAGNOSIS — I129 Hypertensive chronic kidney disease with stage 1 through stage 4 chronic kidney disease, or unspecified chronic kidney disease: Secondary | ICD-10-CM | POA: Diagnosis not present

## 2016-08-23 ENCOUNTER — Ambulatory Visit (INDEPENDENT_AMBULATORY_CARE_PROVIDER_SITE_OTHER): Payer: Medicare Other | Admitting: Pharmacist Clinician (PhC)/ Clinical Pharmacy Specialist

## 2016-08-23 DIAGNOSIS — I4891 Unspecified atrial fibrillation: Secondary | ICD-10-CM | POA: Diagnosis not present

## 2016-08-23 LAB — COAGUCHEK XS/INR WAIVED
INR: 2.8 — AB (ref 0.9–1.1)
Prothrombin Time: 33.2 s

## 2016-08-23 NOTE — Patient Instructions (Signed)
Anticoagulation Dose Instructions as of 08/23/2016      Melvin Wang Tue Wed Thu Fri Sat   New Dose 5 mg 5 mg 5 mg 5 mg 5 mg 0 mg 5 mg    Description   Take 1/2 tablet (5mg ) every day and none on Fridays.    Patient has significant bruising and also blood in urine that nephrologist is running some tests on 10/2.  Patient has loss significant weight, 90 lbs.  INR today 2.8  Will target for around 2.0 to minimize bruising

## 2016-08-27 DIAGNOSIS — L84 Corns and callosities: Secondary | ICD-10-CM | POA: Diagnosis not present

## 2016-08-27 DIAGNOSIS — B351 Tinea unguium: Secondary | ICD-10-CM | POA: Diagnosis not present

## 2016-08-27 DIAGNOSIS — E1142 Type 2 diabetes mellitus with diabetic polyneuropathy: Secondary | ICD-10-CM | POA: Diagnosis not present

## 2016-08-28 ENCOUNTER — Encounter: Payer: Self-pay | Admitting: Family Medicine

## 2016-08-28 ENCOUNTER — Ambulatory Visit (INDEPENDENT_AMBULATORY_CARE_PROVIDER_SITE_OTHER): Payer: Medicare Other | Admitting: Family Medicine

## 2016-08-28 VITALS — BP 109/56 | HR 59 | Temp 97.1°F | Ht 74.0 in | Wt 257.8 lb

## 2016-08-28 DIAGNOSIS — T148XXA Other injury of unspecified body region, initial encounter: Secondary | ICD-10-CM

## 2016-08-28 DIAGNOSIS — S3092XA Unspecified superficial injury of abdominal wall, initial encounter: Secondary | ICD-10-CM

## 2016-08-28 NOTE — Progress Notes (Signed)
   Subjective:    Patient ID: Melvin Wang, male    DOB: 08-05-1941, 75 y.o.   MRN: [id number]  HPI  Patient here today with complaints of bruising.  Patient is currently taking coumadin. He has been on Coumadin for many years and had his pro time checked 5 days ago. It was in the therapeutic range toward the higher in. He denies any bruising elsewhere on his body and there are no petechiae. He has not had any bleeding either blood in the urine or with brushing teeth.            Review of Systems  Constitutional: Negative.   HENT: Negative.   Eyes: Negative.   Respiratory: Negative.   Cardiovascular: Negative.   Gastrointestinal: Negative.   Endocrine: Negative.   Genitourinary: Negative.   Musculoskeletal: Negative.   Skin: Negative.   Allergic/Immunologic: Negative.   Neurological: Negative.   Hematological: Bruises/bleeds easily.  Psychiatric/Behavioral: Negative.       Patient Active Problem List   Diagnosis Date Noted  . Hyperlipemia 06/18/2016  . Petechial rash 08/21/2015  . Gout 01/29/2015  . Atrial fibrillation ([place]) 03/23/2013  . Abnormality of gait 03/18/2013  . Tremor, essential 03/18/2013  . Essential hypertension 03/18/2013  . 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 allopurinol (ZYLOPRIM) 300 MG tablet TAKE 1 TABLET (300 MG TOTAL) BY MOUTH DAILY.  Marland Kitchen atenolol (TENORMIN) 50 MG tablet TAKE 1 TABLET (50 MG TOTAL) BY MOUTH DAILY.  Marland Kitchen atorvastatin (LIPITOR) 40 MG tablet TAKE 1 TABLET BY MOUTH AT BEDTIME  . B-D UF III MINI PEN NEEDLES 31G X 5 MM MISC USE 1 TIME DAILY WITH LANTUS SOLOSTAR  . calcitRIOL (ROCALTROL) 0.5 MCG capsule take 1 capsule every other day  . furosemide (LASIX) 80 MG tablet TAKE 2 TABLETS (160 MG TOTAL) BY MOUTH 2 (TWO) TIMES DAILY.  Marland Kitchen Insulin Glargine (LANTUS SOLOSTAR) 100 UNIT/ML Solostar Pen Inject 40 Units into the skin  every morning.  Marland Kitchen lisinopril (PRINIVIL,ZESTRIL) 20 MG tablet Take 0.5 tablets (10 mg total) by mouth daily.  Marland Kitchen omeprazole (PRILOSEC) 20 MG capsule TAKE 1 CAPSULE (20 MG TOTAL) BY MOUTH DAILY.  . ONE TOUCH ULTRA TEST test strip USE TO CHECK BG UP TO 4 TIMES A DAY  . warfarin (COUMADIN) 10 MG tablet Take as directed by anticoagulation dose - 1/2 to 1 tablet daily   No facility-administered encounter medications on file as of 08/28/2016.          Objective:   Physical Exam  Constitutional: He appears well-developed and well-nourished.  Skin:  There is a hematoma on his left lower abdomen where he gives himself the Coumadin. There is some induration in the upper outer quadrant. I suspect he hit a little venule the plan.    BP (!) 109/56   Pulse (!) 59   Temp 97.1 F (36.2 C) (Oral)   Ht 6\' 2"  (1.88 m)   Wt 257 lb 12.8 oz (116.9 kg)   BMI 33.10 kg/m          Assessment & Plan:  1. Hematoma Patient was reassured. I suggested some warm compresses or heating pad to help the hematoma resolved more quickly. Did not see a need to repeat pro time since it was done less than one week ago or CBC since there are no other signs of bleeding or low platelets.  Wardell Honour MD

## 2016-09-09 DIAGNOSIS — R319 Hematuria, unspecified: Secondary | ICD-10-CM | POA: Diagnosis not present

## 2016-09-13 ENCOUNTER — Other Ambulatory Visit: Payer: Self-pay | Admitting: Family Medicine

## 2016-09-13 ENCOUNTER — Ambulatory Visit (INDEPENDENT_AMBULATORY_CARE_PROVIDER_SITE_OTHER): Payer: Medicare Other | Admitting: Pharmacist

## 2016-09-13 DIAGNOSIS — I4891 Unspecified atrial fibrillation: Secondary | ICD-10-CM

## 2016-09-13 LAB — COAGUCHEK XS/INR WAIVED
INR: 3 — ABNORMAL HIGH (ref 0.9–1.1)
Prothrombin Time: 35.5 s

## 2016-09-13 NOTE — Patient Instructions (Signed)
Anticoagulation Dose Instructions as of 09/13/2016      Melvin Wang Tue Wed Thu Fri Sat   New Dose 5 mg 0 mg 5 mg 5 mg 5 mg 0 mg 5 mg    Description   Take 1/2 tablet (5mg ) every day and none on Monday and Fridays.   INR today 3.0  Want to target around 2.0 to minimize bruising

## 2016-09-13 NOTE — Telephone Encounter (Signed)
appt set for 10/16

## 2016-09-17 ENCOUNTER — Encounter: Payer: Self-pay | Admitting: Family Medicine

## 2016-09-17 ENCOUNTER — Ambulatory Visit (INDEPENDENT_AMBULATORY_CARE_PROVIDER_SITE_OTHER): Payer: Medicare Other | Admitting: Family Medicine

## 2016-09-17 VITALS — BP 110/58 | HR 60 | Temp 97.0°F | Ht 74.0 in | Wt 260.0 lb

## 2016-09-17 DIAGNOSIS — J200 Acute bronchitis due to Mycoplasma pneumoniae: Secondary | ICD-10-CM

## 2016-09-17 MED ORDER — AZITHROMYCIN 250 MG PO TABS: ORAL_TABLET | ORAL | 0 refills | Status: DC

## 2016-09-17 MED ORDER — GUAIFENESIN-CODEINE 100-10 MG/5ML PO SYRP: 5.0000 mL | ORAL_SOLUTION | ORAL | 0 refills | Status: DC | PRN

## 2016-09-17 NOTE — Progress Notes (Signed)
Subjective:  Patient ID: Melvin Wang, male    DOB: 07-02-1941  Age: 75 y.o. MRN: [id number]  CC: Cough (pt here today c/o sore throat and cough that keeps him up all night and he hasn't slept in 2 nights.)   HPI Melvin Wang presents for Patient presents with upper respiratory congestion. No Rhinorrhea. There is no sore throat. Patient reports coughing frequently as well. Loose, but no sputum noted. There is no fever no chills no sweats. The patient denies being short of breath. Onset was4 days ago. Gradually worsening. Interfering with sleep.   History Melvin Wang has a past medical history of Abnormality of gait; Cataract; Chronic kidney disease; Diabetes mellitus without complication ([place]); Dysrhythmia; Foot ulcer, right ([place]) (03/2013); Gout; Hyperlipidemia; Hypertension; and Movement disorder.   He has a past surgical history that includes Cataract extraction w/PHACO (Left, 04/19/2013) and Cataract extraction w/PHACO (Right, 07/15/2013).   His family history includes Cancer in his father and paternal uncle.He reports that he quit smoking about 32 years ago. His smoking use included Cigarettes. He has a 15.00 pack-year smoking history. He has never used smokeless tobacco. He reports that he does not drink alcohol or use drugs.    ROS Review of Systems  Constitutional: Negative for activity change, appetite change, chills and fever.  HENT: Positive for congestion. Negative for ear discharge, ear pain, hearing loss, nosebleeds, postnasal drip, rhinorrhea, sinus pressure, sneezing and trouble swallowing.   Respiratory: Positive for cough. Negative for chest tightness and shortness of breath.   Cardiovascular: Negative for chest pain and palpitations.  Skin: Negative for rash.    Objective:  BP (!) 110/58   Pulse 60   Temp 97 F (36.1 C) (Oral)   Ht 6\' 2"  (1.88 m)   Wt 260 lb (117.9 kg)   BMI 33.38 kg/m   BP Readings from Last 3 Encounters:  09/17/16 (!) 110/58    08/28/16 (!) 109/56  07/23/16 108/60    Wt Readings from Last 3 Encounters:  09/17/16 260 lb (117.9 kg)  08/28/16 257 lb 12.8 oz (116.9 kg)  06/18/16 255 lb 6.4 oz (115.8 kg)     Physical Exam  Constitutional: He appears well-developed and well-nourished.  HENT:  Head: Normocephalic and atraumatic.  Right Ear: Tympanic membrane and external ear normal. No decreased hearing is noted.  Left Ear: Tympanic membrane and external ear normal. No decreased hearing is noted.  Nose: Mucosal edema present. Right sinus exhibits no frontal sinus tenderness. Left sinus exhibits no frontal sinus tenderness.  Mouth/Throat: No oropharyngeal exudate or posterior oropharyngeal erythema.  Neck: No Brudzinski's sign noted.  Pulmonary/Chest: No respiratory distress. He has wheezes. He has no rales.  Lymphadenopathy:       Head (right side): No preauricular adenopathy present.       Head (left side): No preauricular adenopathy present.       Right cervical: No superficial cervical adenopathy present.      Left cervical: No superficial cervical adenopathy present.     Lab Results  Component Value Date   WBC 12.1 (H) 03/11/2016   HGB 12.6 (A) 06/22/2015   HCT 36.5 (L) 03/11/2016   PLT 210 03/11/2016   GLUCOSE 104 (H) 06/18/2016   CHOL 89 (L) 03/18/2016   TRIG 101 03/18/2016   HDL 33 (L) 03/18/2016   LDLCALC 36 03/18/2016   ALT 39 03/11/2016   AST 26 03/11/2016   NA 137 06/18/2016   K 3.8 06/18/2016   CL 91 (L) 06/18/2016  CREATININE 1.38 (H) 06/18/2016   BUN 54 (H) 06/18/2016   CO2 24 06/18/2016   TSH 1.630 12/07/2015   PSA 2.2 08/26/2014   INR 3.0 (H) 09/13/2016   HGBA1C 7.0 12/07/2015    No results found.  Assessment & Plan:   Melvin Wang was seen today for cough.  Diagnoses and all orders for this visit:  Acute bronchitis due to Mycoplasma pneumoniae  Other orders -     azithromycin (ZITHROMAX Z-PAK) 250 MG tablet; Take two right away Then one a day for the next 4 days. -      guaiFENesin-codeine (CHERATUSSIN AC) 100-10 MG/5ML syrup; Take 5 mLs by mouth every 4 (four) hours as needed for cough.     I am having Melvin Wang start on azithromycin and guaiFENesin-codeine. I am also having him maintain his calcitRIOL, warfarin, Insulin Glargine, lisinopril, ONE TOUCH ULTRA TEST, B-D UF III MINI PEN NEEDLES, atenolol, atorvastatin, omeprazole, furosemide, allopurinol, and lisinopril.  Meds ordered this encounter  Medications  . azithromycin (ZITHROMAX Z-PAK) 250 MG tablet    Sig: Take two right away Then one a day for the next 4 days.    Dispense:  6 each    Refill:  0  . guaiFENesin-codeine (CHERATUSSIN AC) 100-10 MG/5ML syrup    Sig: Take 5 mLs by mouth every 4 (four) hours as needed for cough.    Dispense:  180 mL    Refill:  0     Follow-up: Return in about 6 days (around 09/23/2016) for diabetes.  Claretta Fraise, M.D.

## 2016-09-23 ENCOUNTER — Encounter: Payer: Self-pay | Admitting: Family Medicine

## 2016-09-23 ENCOUNTER — Ambulatory Visit (INDEPENDENT_AMBULATORY_CARE_PROVIDER_SITE_OTHER): Payer: Medicare Other | Admitting: Family Medicine

## 2016-09-23 VITALS — BP 85/53 | HR 76 | Temp 96.8°F | Ht 74.0 in | Wt 259.0 lb

## 2016-09-23 DIAGNOSIS — E784 Other hyperlipidemia: Secondary | ICD-10-CM | POA: Diagnosis not present

## 2016-09-23 DIAGNOSIS — I4891 Unspecified atrial fibrillation: Secondary | ICD-10-CM | POA: Diagnosis not present

## 2016-09-23 DIAGNOSIS — E1121 Type 2 diabetes mellitus with diabetic nephropathy: Secondary | ICD-10-CM | POA: Diagnosis not present

## 2016-09-23 DIAGNOSIS — M1A09X0 Idiopathic chronic gout, multiple sites, without tophus (tophi): Secondary | ICD-10-CM

## 2016-09-23 DIAGNOSIS — Z794 Long term (current) use of insulin: Secondary | ICD-10-CM

## 2016-09-23 DIAGNOSIS — Z7689 Persons encountering health services in other specified circumstances: Secondary | ICD-10-CM | POA: Diagnosis not present

## 2016-09-23 DIAGNOSIS — E7849 Other hyperlipidemia: Secondary | ICD-10-CM

## 2016-09-23 DIAGNOSIS — G250 Essential tremor: Secondary | ICD-10-CM

## 2016-09-23 DIAGNOSIS — I10 Essential (primary) hypertension: Secondary | ICD-10-CM | POA: Diagnosis not present

## 2016-09-23 DIAGNOSIS — R6889 Other general symptoms and signs: Secondary | ICD-10-CM | POA: Diagnosis not present

## 2016-09-23 LAB — BAYER DCA HB A1C WAIVED: HB A1C (BAYER DCA - WAIVED): 6.3 % (ref <7.0)

## 2016-09-23 NOTE — Progress Notes (Signed)
Subjective:  Patient ID: Melvin Wang, male    DOB: 30-Nov-1941  Age: 75 y.o. MRN: [id number]  CC: Diabetes (3 mo); Hypertension; and Hyperlipidemia   HPI Melvin Wang presents for  Tremor.  follow-up of hypertension. Patient has no history of headache chest pain or shortness of breath or recent cough. Patient also denies symptoms of TIA such as numbness weakness lateralizing. Patient checks  blood pressure at home and has not had any elevated readings recently. Patient denies side effects from his medication. States taking it regularly.  Patient also  in for follow-up of elevated cholesterol. Doing well without complaints on current medication. Denies side effects of statin including myalgia and arthralgia and nausea. Also in today for liver function testing. Currently no chest pain, shortness of breath or other cardiovascular related symptoms noted.  Follow-up of diabetes. Patient does check blood sugar at home. Readings run between 90 and 140 Patient denies symptoms such as polyuria, polydipsia, excessive hunger, nausea No significant hypoglycemic spells noted. Medications as noted below. Taking them regularly without complication/adverse reaction being reported today.   Patient also taking allopurinol for chronic gout. He'll have an occasional flare for a day once a month or so if it's a bad month. Pain varies from joints primarily in the toes.   Patient in for follow-up of atrial fibrillation. Patient denies any recent bouts of chest pain or palpitations. Additionally, patient is taking anticoagulants. Patient denies any recent excessive bleeding episodes including epistaxis, bleeding from the gums, genitalia, rectal bleeding or hematuria. Additionally there has been no excessive bruising.  Patient in for follow-up of GERD. Currently asymptomatic taking  PPI daily. There is no chest pain or heartburn. No hematemesis and no melena. No dysphagia or choking. Onset is remote.  Progression is stable. Complicating factors, none.  History Melvin Wang has a past medical history of Abnormality of gait; Cataract; Chronic kidney disease; Diabetes mellitus without complication ([place]); Dysrhythmia; Foot ulcer, right ([place]) (03/2013); Gout; Hyperlipidemia; Hypertension; and Movement disorder.   He has a past surgical history that includes Cataract extraction w/PHACO (Left, 04/19/2013) and Cataract extraction w/PHACO (Right, 07/15/2013).   His family history includes Cancer in his father and paternal uncle.He reports that he quit smoking about 32 years ago. His smoking use included Cigarettes. He has a 15.00 pack-year smoking history. He has never used smokeless tobacco. He reports that he does not drink alcohol or use drugs.  Current Outpatient Prescriptions on File Prior to Visit  Medication Sig Dispense Refill  . allopurinol (ZYLOPRIM) 300 MG tablet TAKE 1 TABLET (300 MG TOTAL) BY MOUTH DAILY. 90 tablet 0  . atenolol (TENORMIN) 50 MG tablet TAKE 1 TABLET (50 MG TOTAL) BY MOUTH DAILY. 90 tablet 1  . atorvastatin (LIPITOR) 40 MG tablet TAKE 1 TABLET BY MOUTH AT BEDTIME 90 tablet 0  . B-D UF III MINI PEN NEEDLES 31G X 5 MM MISC USE 1 TIME DAILY WITH LANTUS SOLOSTAR 100 each 2  . calcitRIOL (ROCALTROL) 0.5 MCG capsule take 1 capsule every other day    . furosemide (LASIX) 80 MG tablet TAKE 2 TABLETS (160 MG TOTAL) BY MOUTH 2 (TWO) TIMES DAILY. 360 tablet 0  . Insulin Glargine (LANTUS SOLOSTAR) 100 UNIT/ML Solostar Pen Inject 40 Units into the skin every morning. 5 pen PRN  . lisinopril (PRINIVIL,ZESTRIL) 20 MG tablet Take 0.5 tablets (10 mg total) by mouth daily. 45 tablet 0  . omeprazole (PRILOSEC) 20 MG capsule TAKE 1 CAPSULE (20 MG TOTAL) BY MOUTH DAILY.  30 capsule 4  . ONE TOUCH ULTRA TEST test strip USE TO CHECK BG UP TO 4 TIMES A DAY 400 each 1  . warfarin (COUMADIN) 10 MG tablet Take as directed by anticoagulation dose - 1/2 to 1 tablet daily-Take no Warfarin on Monday or Friday  90 tablet 1   No current facility-administered medications on file prior to visit.     ROS Review of Systems  Constitutional: Negative for chills, diaphoresis, fever and unexpected weight change.  HENT: Negative for congestion, hearing loss, rhinorrhea and sore throat.   Eyes: Negative for visual disturbance.  Respiratory: Negative for cough and shortness of breath.   Cardiovascular: Negative for chest pain.  Gastrointestinal: Negative for abdominal pain, constipation and diarrhea.  Genitourinary: Negative for dysuria and flank pain.  Musculoskeletal: Negative for arthralgias and joint swelling.  Skin: Negative for rash.  Neurological: Negative for dizziness and headaches.  Psychiatric/Behavioral: Negative for dysphoric mood and sleep disturbance.    Objective:  BP (!) 85/53 (BP Location: Right Arm)   Pulse 76   Temp (!) 96.8 F (36 C) (Oral)   Ht 6' 2"  (1.88 m)   Wt 259 lb (117.5 kg)   BMI 33.25 kg/m   BP Readings from Last 3 Encounters:  09/23/16 (!) 85/53  09/17/16 (!) 110/58  08/28/16 (!) 109/56    Wt Readings from Last 3 Encounters:  09/23/16 259 lb (117.5 kg)  09/17/16 260 lb (117.9 kg)  08/28/16 257 lb 12.8 oz (116.9 kg)     Physical Exam  Constitutional: He is oriented to person, place, and time. He appears well-developed and well-nourished. No distress.  HENT:  Head: Normocephalic and atraumatic.  Right Ear: External ear normal.  Left Ear: External ear normal.  Nose: Nose normal.  Mouth/Throat: Oropharynx is clear and moist.  Eyes: Conjunctivae and EOM are normal. Pupils are equal, round, and reactive to light.  Neck: Normal range of motion. Neck supple. No thyromegaly present.  Cardiovascular: Normal rate, regular rhythm and normal heart sounds.   No murmur heard. Pulmonary/Chest: Effort normal and breath sounds normal. No respiratory distress. He has no wheezes. He has no rales.  Abdominal: Soft. Bowel sounds are normal. He exhibits no distension.  There is no tenderness.  Lymphadenopathy:    He has no cervical adenopathy.  Neurological: He is alert and oriented to person, place, and time. He has normal reflexes.  Skin: Skin is warm and dry.  Psychiatric: He has a normal mood and affect. His behavior is normal. Judgment and thought content normal.    Lab Results  Component Value Date   HGBA1C 7.0 12/07/2015   HGBA1C 7.2 09/04/2015   HGBA1C 7.0 05/05/2015    Lab Results  Component Value Date   WBC 12.1 (H) 03/11/2016   HGB 12.6 (A) 06/22/2015   HCT 36.5 (L) 03/11/2016   PLT 210 03/11/2016   GLUCOSE 104 (H) 06/18/2016   CHOL 89 (L) 03/18/2016   TRIG 101 03/18/2016   HDL 33 (L) 03/18/2016   LDLCALC 36 03/18/2016   ALT 39 03/11/2016   AST 26 03/11/2016   NA 137 06/18/2016   K 3.8 06/18/2016   CL 91 (L) 06/18/2016   CREATININE 1.38 (H) 06/18/2016   BUN 54 (H) 06/18/2016   CO2 24 06/18/2016   TSH 1.630 12/07/2015   PSA 2.2 08/26/2014   INR 3.0 (H) 09/13/2016   HGBA1C 7.0 12/07/2015    INR today is 2.2. Patient was informed in the office and told to continue  Coumadin as is with standard precautions.  Assessment & Plan:   Melvin Wang was seen today for diabetes, hypertension and hyperlipidemia.  Diagnoses and all orders for this visit:  Atrial fibrillation, unspecified type ([place]) -     CBC with Differential/Platelet -     CMP14+EGFR -     EKG 12-Lead  Controlled type 2 diabetes mellitus with diabetic nephropathy, with long-term current use of insulin ([facility]) -     Bayer DCA Hb A1c Waived -     CBC with Differential/Platelet -     CMP14+EGFR -     Lipid panel  Idiopathic chronic gout of multiple sites without tophus -     CBC with Differential/Platelet -     CMP14+EGFR  Tremor, essential -     CBC with Differential/Platelet -     CMP14+EGFR -     Ambulatory referral to Neurology  Essential hypertension -     CBC with Differential/Platelet -     CMP14+EGFR  Other hyperlipidemia -     CBC with  Differential/Platelet -     CMP14+EGFR -     Lipid panel   I have discontinued Melvin Wang azithromycin and guaiFENesin-codeine. I am also having him maintain his calcitRIOL, warfarin, Insulin Glargine, lisinopril, ONE TOUCH ULTRA TEST, B-D UF III MINI PEN NEEDLES, atenolol, atorvastatin, omeprazole, furosemide, and allopurinol.  No orders of the defined types were placed in this encounter.  Patient recently had eye exam by local optometrist. He denies any diabetic retinopathy.  Foot exam completed by Dr. Steffanie Rainwater. He feels his pain from the diabetic neuropathy is not sufficient to merit use of medication. Continue medications without change. Regular exercise and diet using low-carb/complex carb and a Mediterranean diet approach recommended.  Continue to monitor for signs of excessive bruising or bleeding.  Report changes in joint pain particularly accompanied by redness and swelling. Follow-up: Return in about 3 months (around 12/24/2016).  Claretta Fraise, M.D.

## 2016-09-24 LAB — CMP14+EGFR
ALT: 29 IU/L (ref 0–44)
AST: 40 IU/L (ref 0–40)
Albumin/Globulin Ratio: 0.9 — ABNORMAL LOW (ref 1.2–2.2)
Albumin: 3.7 g/dL (ref 3.5–4.8)
Alkaline Phosphatase: 476 IU/L — ABNORMAL HIGH (ref 39–117)
BUN/Creatinine Ratio: 35 — ABNORMAL HIGH (ref 10–24)
BUN: 49 mg/dL — ABNORMAL HIGH (ref 8–27)
Bilirubin Total: 1.7 mg/dL — ABNORMAL HIGH (ref 0.0–1.2)
CALCIUM: 8.9 mg/dL (ref 8.6–10.2)
CO2: 28 mmol/L (ref 18–29)
CREATININE: 1.42 mg/dL — AB (ref 0.76–1.27)
Chloride: 95 mmol/L — ABNORMAL LOW (ref 96–106)
GFR calc Af Amer: 56 mL/min/{1.73_m2} — ABNORMAL LOW (ref >59)
GFR, EST NON AFRICAN AMERICAN: 49 mL/min/{1.73_m2} — AB (ref >59)
GLOBULIN, TOTAL: 4 g/dL (ref 1.5–4.5)
Glucose: 87 mg/dL (ref 65–99)
Potassium: 4.5 mmol/L (ref 3.5–5.2)
Sodium: 139 mmol/L (ref 134–144)
Total Protein: 7.7 g/dL (ref 6.0–8.5)

## 2016-09-24 LAB — CBC WITH DIFFERENTIAL/PLATELET
Basophils Absolute: 0 10*3/uL (ref 0.0–0.2)
Basos: 0 %
EOS (ABSOLUTE): 0.1 10*3/uL (ref 0.0–0.4)
EOS: 1 %
HEMATOCRIT: 32.3 % — AB (ref 37.5–51.0)
Hemoglobin: 10.8 g/dL — ABNORMAL LOW (ref 12.6–17.7)
IMMATURE GRANULOCYTES: 0 %
Immature Grans (Abs): 0 10*3/uL (ref 0.0–0.1)
LYMPHS: 14 %
Lymphocytes Absolute: 1.4 10*3/uL (ref 0.7–3.1)
MCH: 33.2 pg — ABNORMAL HIGH (ref 26.6–33.0)
MCHC: 33.4 g/dL (ref 31.5–35.7)
MCV: 99 fL — ABNORMAL HIGH (ref 79–97)
MONOS ABS: 0.9 10*3/uL (ref 0.1–0.9)
Monocytes: 9 %
NEUTROS PCT: 76 %
Neutrophils Absolute: 7.8 10*3/uL — ABNORMAL HIGH (ref 1.4–7.0)
PLATELETS: 113 10*3/uL — AB (ref 150–379)
RBC: 3.25 x10E6/uL — AB (ref 4.14–5.80)
RDW: 17.3 % — AB (ref 12.3–15.4)
WBC: 10.2 10*3/uL (ref 3.4–10.8)

## 2016-09-24 LAB — LIPID PANEL
CHOL/HDL RATIO: 2.4 ratio (ref 0.0–5.0)
Cholesterol, Total: 77 mg/dL — ABNORMAL LOW (ref 100–199)
HDL: 32 mg/dL — AB (ref >39)
LDL CALC: 33 mg/dL (ref 0–99)
TRIGLYCERIDES: 62 mg/dL (ref 0–149)
VLDL CHOLESTEROL CAL: 12 mg/dL (ref 5–40)

## 2016-09-25 ENCOUNTER — Other Ambulatory Visit: Payer: Self-pay | Admitting: Family Medicine

## 2016-09-25 LAB — FE+TIBC+FER+B12+FOLIC
FERRITIN: 422 ng/mL — AB (ref 30–400)
FOLATE: 14.3 ng/mL (ref >3.0)
IRON SATURATION: 17 % (ref 15–55)
Iron: 47 ug/dL (ref 38–169)
Total Iron Binding Capacity: 284 ug/dL (ref 250–450)
UIBC: 237 ug/dL (ref 111–343)
Vitamin B-12: 858 pg/mL (ref 211–946)

## 2016-09-25 LAB — SPECIMEN STATUS REPORT

## 2016-10-08 ENCOUNTER — Other Ambulatory Visit: Payer: Self-pay | Admitting: Family Medicine

## 2016-10-25 ENCOUNTER — Ambulatory Visit (INDEPENDENT_AMBULATORY_CARE_PROVIDER_SITE_OTHER): Payer: Medicare Other | Admitting: Pharmacist

## 2016-10-25 DIAGNOSIS — I4891 Unspecified atrial fibrillation: Secondary | ICD-10-CM

## 2016-10-25 LAB — COAGUCHEK XS/INR WAIVED
INR: 2.3 — ABNORMAL HIGH (ref 0.9–1.1)
Prothrombin Time: 27.6 s

## 2016-10-29 ENCOUNTER — Ambulatory Visit (INDEPENDENT_AMBULATORY_CARE_PROVIDER_SITE_OTHER): Payer: Medicare Other | Admitting: *Deleted

## 2016-10-29 NOTE — Progress Notes (Signed)
Pt did not get zostavax due to cost

## 2016-11-02 ENCOUNTER — Other Ambulatory Visit: Payer: Self-pay | Admitting: Family Medicine

## 2016-11-04 ENCOUNTER — Ambulatory Visit: Payer: Self-pay | Admitting: Neurology

## 2016-11-07 DIAGNOSIS — R3121 Asymptomatic microscopic hematuria: Secondary | ICD-10-CM | POA: Diagnosis not present

## 2016-11-11 DIAGNOSIS — Z9842 Cataract extraction status, left eye: Secondary | ICD-10-CM | POA: Diagnosis not present

## 2016-11-11 DIAGNOSIS — D3131 Benign neoplasm of right choroid: Secondary | ICD-10-CM | POA: Diagnosis not present

## 2016-11-11 DIAGNOSIS — Z961 Presence of intraocular lens: Secondary | ICD-10-CM | POA: Diagnosis not present

## 2016-11-11 DIAGNOSIS — H26491 Other secondary cataract, right eye: Secondary | ICD-10-CM | POA: Diagnosis not present

## 2016-11-14 DIAGNOSIS — R3129 Other microscopic hematuria: Secondary | ICD-10-CM | POA: Diagnosis not present

## 2016-11-14 DIAGNOSIS — R3121 Asymptomatic microscopic hematuria: Secondary | ICD-10-CM | POA: Diagnosis not present

## 2016-11-21 DIAGNOSIS — R3121 Asymptomatic microscopic hematuria: Secondary | ICD-10-CM | POA: Diagnosis not present

## 2016-11-28 ENCOUNTER — Other Ambulatory Visit: Payer: Self-pay | Admitting: Family Medicine

## 2016-12-05 DIAGNOSIS — H26491 Other secondary cataract, right eye: Secondary | ICD-10-CM | POA: Diagnosis not present

## 2016-12-05 DIAGNOSIS — H26493 Other secondary cataract, bilateral: Secondary | ICD-10-CM | POA: Diagnosis not present

## 2016-12-06 ENCOUNTER — Ambulatory Visit (INDEPENDENT_AMBULATORY_CARE_PROVIDER_SITE_OTHER): Payer: Medicare Other | Admitting: Pharmacist

## 2016-12-06 DIAGNOSIS — I4891 Unspecified atrial fibrillation: Secondary | ICD-10-CM

## 2016-12-06 LAB — COAGUCHEK XS/INR WAIVED
INR: 2 — AB (ref 0.9–1.1)
PROTHROMBIN TIME: 24.4 s

## 2016-12-06 NOTE — Patient Instructions (Signed)
Anticoagulation Dose Instructions as of 12/06/2016      Melvin Wang Tue Wed Thu Fri Sat   New Dose 5 mg 0 mg 5 mg 5 mg 5 mg 0 mg 5 mg    Description   Take 1/2 tablet (5mg ) every day and none on Monday and Fridays.  INR today 2.0

## 2016-12-30 DIAGNOSIS — E1122 Type 2 diabetes mellitus with diabetic chronic kidney disease: Secondary | ICD-10-CM | POA: Diagnosis not present

## 2016-12-30 DIAGNOSIS — D631 Anemia in chronic kidney disease: Secondary | ICD-10-CM | POA: Diagnosis not present

## 2016-12-30 DIAGNOSIS — N2581 Secondary hyperparathyroidism of renal origin: Secondary | ICD-10-CM | POA: Diagnosis not present

## 2016-12-30 DIAGNOSIS — N183 Chronic kidney disease, stage 3 (moderate): Secondary | ICD-10-CM | POA: Diagnosis not present

## 2016-12-30 DIAGNOSIS — I129 Hypertensive chronic kidney disease with stage 1 through stage 4 chronic kidney disease, or unspecified chronic kidney disease: Secondary | ICD-10-CM | POA: Diagnosis not present

## 2016-12-31 DIAGNOSIS — E1142 Type 2 diabetes mellitus with diabetic polyneuropathy: Secondary | ICD-10-CM | POA: Diagnosis not present

## 2016-12-31 DIAGNOSIS — B351 Tinea unguium: Secondary | ICD-10-CM | POA: Diagnosis not present

## 2016-12-31 DIAGNOSIS — L84 Corns and callosities: Secondary | ICD-10-CM | POA: Diagnosis not present

## 2017-01-10 ENCOUNTER — Encounter: Payer: Self-pay | Admitting: Pharmacist Clinician (PhC)/ Clinical Pharmacy Specialist

## 2017-01-13 ENCOUNTER — Emergency Department (HOSPITAL_COMMUNITY)
Admission: EM | Admit: 2017-01-13 | Discharge: 2017-01-13 | Disposition: A | Discharge status: Home / Self Care | Payer: Medicare Other | Attending: Emergency Medicine | Admitting: Emergency Medicine

## 2017-01-13 ENCOUNTER — Encounter (HOSPITAL_COMMUNITY): Payer: Self-pay | Admitting: Emergency Medicine

## 2017-01-13 ENCOUNTER — Telehealth: Payer: Self-pay | Admitting: Family Medicine

## 2017-01-13 ENCOUNTER — Emergency Department (HOSPITAL_COMMUNITY): Payer: Medicare Other

## 2017-01-13 ENCOUNTER — Encounter: Payer: Self-pay | Admitting: Gastroenterology

## 2017-01-13 DIAGNOSIS — I480 Paroxysmal atrial fibrillation: Secondary | ICD-10-CM | POA: Insufficient documentation

## 2017-01-13 DIAGNOSIS — K7460 Unspecified cirrhosis of liver: Secondary | ICD-10-CM | POA: Diagnosis not present

## 2017-01-13 DIAGNOSIS — Z7901 Long term (current) use of anticoagulants: Secondary | ICD-10-CM | POA: Diagnosis not present

## 2017-01-13 DIAGNOSIS — E1122 Type 2 diabetes mellitus with diabetic chronic kidney disease: Secondary | ICD-10-CM | POA: Insufficient documentation

## 2017-01-13 DIAGNOSIS — R140 Abdominal distension (gaseous): Secondary | ICD-10-CM | POA: Diagnosis present

## 2017-01-13 DIAGNOSIS — Z87891 Personal history of nicotine dependence: Secondary | ICD-10-CM | POA: Diagnosis not present

## 2017-01-13 DIAGNOSIS — N189 Chronic kidney disease, unspecified: Secondary | ICD-10-CM | POA: Insufficient documentation

## 2017-01-13 DIAGNOSIS — I4891 Unspecified atrial fibrillation: Secondary | ICD-10-CM

## 2017-01-13 DIAGNOSIS — R188 Other ascites: Secondary | ICD-10-CM

## 2017-01-13 DIAGNOSIS — Z79899 Other long term (current) drug therapy: Secondary | ICD-10-CM | POA: Insufficient documentation

## 2017-01-13 DIAGNOSIS — I129 Hypertensive chronic kidney disease with stage 1 through stage 4 chronic kidney disease, or unspecified chronic kidney disease: Secondary | ICD-10-CM | POA: Insufficient documentation

## 2017-01-13 LAB — CBC WITH DIFFERENTIAL/PLATELET
BASOS PCT: 0 %
Basophils Absolute: 0 10*3/uL (ref 0.0–0.1)
EOS ABS: 0.1 10*3/uL (ref 0.0–0.7)
EOS PCT: 1 %
HEMATOCRIT: 33.2 % — AB (ref 39.0–52.0)
Hemoglobin: 11.5 g/dL — ABNORMAL LOW (ref 13.0–17.0)
Lymphocytes Relative: 10 %
Lymphs Abs: 1.1 10*3/uL (ref 0.7–4.0)
MCH: 34.8 pg — ABNORMAL HIGH (ref 26.0–34.0)
MCHC: 34.6 g/dL (ref 30.0–36.0)
MCV: 100.6 fL — ABNORMAL HIGH (ref 78.0–100.0)
MONO ABS: 1.2 10*3/uL — AB (ref 0.1–1.0)
MONOS PCT: 11 %
NEUTROS ABS: 8.8 10*3/uL — AB (ref 1.7–7.7)
Neutrophils Relative %: 78 %
PLATELETS: 121 10*3/uL — AB (ref 150–400)
RBC: 3.3 MIL/uL — ABNORMAL LOW (ref 4.22–5.81)
RDW: 18.3 % — AB (ref 11.5–15.5)
WBC: 11.1 10*3/uL — ABNORMAL HIGH (ref 4.0–10.5)

## 2017-01-13 LAB — COMPREHENSIVE METABOLIC PANEL
ALBUMIN: 3.5 g/dL (ref 3.5–5.0)
ALK PHOS: 241 U/L — AB (ref 38–126)
ALT: 21 U/L (ref 17–63)
AST: 38 U/L (ref 15–41)
Anion gap: 11 (ref 5–15)
BILIRUBIN TOTAL: 2.4 mg/dL — AB (ref 0.3–1.2)
BUN: 45 mg/dL — AB (ref 6–20)
CALCIUM: 8.9 mg/dL (ref 8.9–10.3)
CO2: 29 mmol/L (ref 22–32)
CREATININE: 1.24 mg/dL (ref 0.61–1.24)
Chloride: 92 mmol/L — ABNORMAL LOW (ref 101–111)
GFR calc Af Amer: >60 mL/min (ref >60)
GFR, EST NON AFRICAN AMERICAN: 56 mL/min — AB (ref >60)
GLUCOSE: 75 mg/dL (ref 65–99)
Potassium: 4.1 mmol/L (ref 3.5–5.1)
Sodium: 132 mmol/L — ABNORMAL LOW (ref 135–145)
TOTAL PROTEIN: 7.5 g/dL (ref 6.5–8.1)

## 2017-01-13 LAB — PROTIME-INR
INR: 2.31
Prothrombin Time: 25.8 seconds — ABNORMAL HIGH (ref 11.4–15.2)

## 2017-01-13 LAB — LIPASE, BLOOD: LIPASE: 22 U/L (ref 11–51)

## 2017-01-13 MED ORDER — IOPAMIDOL (ISOVUE-300) INJECTION 61%
100.0000 mL | Freq: Once | INTRAVENOUS | Status: AC | PRN
  Administered 2017-01-13: 100 mL via INTRAVENOUS

## 2017-01-13 MED ORDER — DILTIAZEM HCL 25 MG/5ML IV SOLN
20.0000 mg | Freq: Once | INTRAVENOUS | Status: AC
  Administered 2017-01-13: 20 mg via INTRAVENOUS
  Filled 2017-01-13: qty 5

## 2017-01-13 MED ORDER — SODIUM CHLORIDE 0.9 % IV BOLUS (SEPSIS)
1000.0000 mL | Freq: Once | INTRAVENOUS | Status: AC
  Administered 2017-01-13: 1000 mL via INTRAVENOUS

## 2017-01-13 MED ORDER — IOPAMIDOL (ISOVUE-300) INJECTION 61%
INTRAVENOUS | Status: AC
  Administered 2017-01-13: 30 mL
  Filled 2017-01-13: qty 30

## 2017-01-13 NOTE — Telephone Encounter (Signed)
Pt notified of reccomendation Verbalizes understanding

## 2017-01-13 NOTE — ED Notes (Signed)
Pt was only able to tolerate one bottle of po contrast.  CT made aware.

## 2017-01-13 NOTE — Telephone Encounter (Signed)
Pt went to [facility] ED this morning, they wanted him to contact Dr. Livia Snellen regarding his recommendation on decreasing his atrovastatin

## 2017-01-13 NOTE — ED Notes (Signed)
Cardizem was given very slowly to avoid hypotension.  HR much improved.  Will monitor.

## 2017-01-13 NOTE — Telephone Encounter (Signed)
Please contact the patient He can continue the atorvastatin. He should stop the allopurinol.

## 2017-01-13 NOTE — ED Provider Notes (Signed)
[facility] DEPT Provider Note   CSN: [id number] Arrival date & time: 01/13/17  [id number]  By signing my name below, I, Ethelle Lyon Long, attest that this documentation has been prepared under the direction and in the presence of Isla Pence, MD . Electronically Signed: Ethelle Lyon Long, Scribe. 01/13/2017. 9:54 AM.   History   Chief Complaint Chief Complaint  Patient presents with  . Abdominal Pain     The history is provided by the patient and the spouse. No language interpreter was used.    HPI Comments:  Melvin Wang is a 76 y.o. male with a PMHx of HTN, renal insufficiency, and Afib, who presents to the Emergency Department complaining of abdominal distention onset one month ago. He notes this has been occurring since after christmas. One week earlier, he was seen at his Urologist's office who preformed prostate, kidney function, and bladder inspection tests and were reported to him as normal. He also notes a CT scan was performed and revealed Cholelithiasis present. He has associated symptoms of hematuria that has since resolved and decreased PO intake. He denies taking any medication today for relief of his symptoms or his heart medicine. He also denies any other associated symptoms at this time.   CHADVASC score:  4 on coumadin    Past Medical History:  Diagnosis Date  . Abnormality of gait   . Cataract   . Chronic kidney disease    renal insufficiency, sees nephrologist: [facility]  . Diabetes mellitus without complication ([place])   . Dysrhythmia    AFib  . Foot ulcer, right ([place]) 03/2013  . Gout   . Hyperlipidemia   . Hypertension   . Movement disorder     Patient Active Problem List   Diagnosis Date Noted  . Hyperlipemia 06/18/2016  . Petechial rash 08/21/2015  . Gout 01/29/2015  . Atrial fibrillation ([place]) 03/23/2013  . Abnormality of gait 03/18/2013  . Tremor, essential 03/18/2013  . Essential hypertension 03/18/2013  . Controlled type 2 diabetes  mellitus with diabetic nephropathy ([place]) 03/18/2013    Past Surgical History:  Procedure Laterality Date  . CATARACT EXTRACTION W/PHACO Left 04/19/2013   Procedure: CATARACT EXTRACTION PHACO AND INTRAOCULAR LENS PLACEMENT (IOC);  Surgeon: Tonny Branch, MD;  Location: [facility];  Service: Ophthalmology;  Laterality: Left;  CDE 29.30  . CATARACT EXTRACTION W/PHACO Right 07/15/2013   Procedure: CATARACT EXTRACTION PHACO AND INTRAOCULAR LENS PLACEMENT (IOC);  Surgeon: Tonny Branch, MD;  Location: [facility];  Service: Ophthalmology;  Laterality: Right;  CDE:12.84       Home Medications    Prior to Admission medications   Medication Sig Start Date End Date Taking? Authorizing Provider  allopurinol (ZYLOPRIM) 300 MG tablet TAKE 1 TABLET (300 MG TOTAL) BY MOUTH DAILY. 11/04/16  Yes Claretta Fraise, MD  atenolol (TENORMIN) 50 MG tablet TAKE 1 TABLET (50 MG TOTAL) BY MOUTH DAILY. 11/28/16  Yes Claretta Fraise, MD  atorvastatin (LIPITOR) 40 MG tablet TAKE 1 TABLET BY MOUTH AT BEDTIME 10/08/16  Yes Claretta Fraise, MD  B-D UF III MINI PEN NEEDLES 31G X 5 MM MISC USE 1 TIME DAILY WITH LANTUS SOLOSTAR 06/05/16  Yes Claretta Fraise, MD  calcitRIOL (ROCALTROL) 0.5 MCG capsule take 1 capsule every other day   Yes Lysbeth Penner, FNP  furosemide (LASIX) 80 MG tablet TAKE 2 TABLETS (160 MG TOTAL) BY MOUTH 2 (TWO) TIMES DAILY. 11/04/16  Yes Claretta Fraise, MD  LANTUS SOLOSTAR 100 UNIT/ML Solostar Pen INJECT 40 UNITS INTO THE  SKIN EVERY MORNING. 09/25/16  Yes Claretta Fraise, MD  omeprazole (PRILOSEC) 20 MG capsule TAKE 1 CAPSULE (20 MG TOTAL) BY MOUTH DAILY. 07/19/16  Yes Claretta Fraise, MD  ONE TOUCH ULTRA TEST test strip USE TO CHECK BG UP TO 4 TIMES A DAY 05/27/16  Yes Claretta Fraise, MD  warfarin (COUMADIN) 10 MG tablet Take as directed by anticoagulation dose - 1/2 to 1 tablet daily-Take no Warfarin on Monday or Friday 05/05/15  Yes Cherre Robins, PharmD    Family History Family History  Problem Relation Age of Onset  . Cancer  Father     rectal  . Cancer Paternal Uncle     Social History Social History  Substance Use Topics  . Smoking status: Former Smoker    Packs/day: 1.00    Years: 15.00    Types: Cigarettes    Quit date: 07/07/1984  . Smokeless tobacco: Never Used  . Alcohol use No     Allergies   Acyclovir and related; Metformin and related; Amoxicillin; Augmentin [amoxicillin-pot clavulanate]; and Mavik [trandolapril]   Review of Systems Review of Systems 10 systems reviewed and all are negative for acute change except as noted in the HPI.    Physical Exam Updated Vital Signs BP 114/72   Pulse 94   Resp 22   SpO2 91%   Physical Exam  Constitutional: He is oriented to person, place, and time. He appears well-developed and well-nourished.  HENT:  Head: Normocephalic and atraumatic.  Right Ear: External ear normal.  Left Ear: External ear normal.  Nose: Nose normal.  Eyes: Right eye exhibits no discharge. Left eye exhibits no discharge.  Neck: Neck supple.  Cardiovascular: Normal heart sounds.  An irregularly irregular rhythm present. Tachycardia present.   Pulmonary/Chest: Effort normal and breath sounds normal.  Abdominal: Soft. He exhibits distension and ascites. There is no tenderness.  Musculoskeletal: He exhibits edema.  1+ edema to bilateral lower extremities.   Neurological: He is alert and oriented to person, place, and time.  Skin: Skin is warm and dry.  Chronic skin changes to bilateral lower extremities.   Nursing note and vitals reviewed.    ED Treatments / Results  DIAGNOSTIC STUDIES:  Oxygen Saturation is 94% on RA, adequate by my interpretation.    COORDINATION OF CARE:  9:14 AM Discussed treatment plan with pt at bedside including IV placement and repeat CT scan of A/P and pt agreed to plan.  Labs (all labs ordered are listed, but only abnormal results are displayed) Labs Reviewed  COMPREHENSIVE METABOLIC PANEL - Abnormal; Notable for the following:        Result Value   Sodium 132 (*)    Chloride 92 (*)    BUN 45 (*)    Alkaline Phosphatase 241 (*)    Total Bilirubin 2.4 (*)    GFR calc non Af Amer 56 (*)    All other components within normal limits  CBC WITH DIFFERENTIAL/PLATELET - Abnormal; Notable for the following:    WBC 11.1 (*)    RBC 3.30 (*)    Hemoglobin 11.5 (*)    HCT 33.2 (*)    MCV 100.6 (*)    MCH 34.8 (*)    RDW 18.3 (*)    Platelets 121 (*)    Neutro Abs 8.8 (*)    Monocytes Absolute 1.2 (*)    All other components within normal limits  PROTIME-INR - Abnormal; Notable for the following:    Prothrombin Time 25.8 (*)  All other components within normal limits  LIPASE, BLOOD    EKG  EKG Interpretation  Date/Time:  Monday January 13 2017 09:02:24 EST Ventricular Rate:  142 PR Interval:    QRS Duration: 147 QT Interval:  330 QTC Calculation: 508 R Axis:   -120 Text Interpretation:  Atrial fibrillation Paired ventricular premature complexes Right bundle branch block Baseline wander in lead(s) V2 No significant change since last tracing Confirmed by [facility] MD, Tongela Encinas ([id number]) on 01/13/2017 9:06:10 AM       Radiology Ct Abdomen Pelvis W Contrast  Result Date: 01/13/2017 CLINICAL DATA:  Abdominal distension. EXAM: CT ABDOMEN AND PELVIS WITH CONTRAST TECHNIQUE: Multidetector CT imaging of the abdomen and pelvis was performed using the standard protocol following bolus administration of intravenous contrast. CONTRAST:  177mL ISOVUE-300 IOPAMIDOL (ISOVUE-300) INJECTION 61% COMPARISON:  11/14/2016 FINDINGS: Lower chest: Mild cardiomegaly. Diffuse coronary artery calcifications in the visualized right coronary artery. Bibasilar atelectasis. No effusions. Hepatobiliary: Mild enlargement of the left hepatic lobe and subtle nodularity to the liver surface suggests cirrhosis. No visible focal abnormality or biliary ductal dilatation. No visible gallstones. Pancreas: No focal abnormality or ductal dilatation. Spleen: No  focal abnormality.  Normal size. Adrenals/Urinary Tract: Small cyst in the midpole of the right kidney, otherwise unremarkable kidneys. Adrenal glands and urinary bladder unremarkable. Again noted is delayed excretion of contrast from both kidneys, similar to prior study. Stomach/Bowel: Stomach, large and small bowel grossly unremarkable. Vascular/Lymphatic: Aortic and iliac calcifications. No aneurysm. No adenopathy. Reproductive: No visible focal abnormality. Other: Moderate to large volume ascites in the abdomen and pelvis, increased since prior study. No free air. Musculoskeletal: No acute bony abnormality or focal bone lesion. Degenerative disc disease in the lumbar spine. IMPRESSION: Findings suspicious for cirrhosis. Moderate to large volume ascites, increasing since prior study. Diffuse aortic atherosclerosis and coronary artery disease. Cardiomegaly. Electronically Signed   By: Rolm Baptise M.D.   On: 01/13/2017 10:43    Procedures Procedures (including critical care time)  Medications Ordered in ED Medications  sodium chloride 0.9 % bolus 1,000 mL (1,000 mLs Intravenous New Bag/Given 01/13/17 0930)  diltiazem (CARDIZEM) injection 20 mg (20 mg Intravenous Given 01/13/17 0944)  iopamidol (ISOVUE-300) 61 % injection (30 mLs  Contrast Given 01/13/17 0926)  iopamidol (ISOVUE-300) 61 % injection 100 mL (100 mLs Intravenous Contrast Given 01/13/17 1029)     Initial Impression / Assessment and Plan / ED Course  I have reviewed the triage vital signs and the nursing notes.  Pertinent labs & imaging results that were available during my care of the patient were reviewed by me and considered in my medical decision making (see chart for details).  HR improved with 20 mg of cardizem.  No need for drip.  Pt has not taken any of his meds this morning.    I reviewed the CT scan done at [facility] urology from 11/14/16.  It did show mild cirrhosis.  No mention of ascites.  Pt said he was not told of the  possibility of cirrhosis.  No urgent need for paracentesis and pt is on coumadin.  The pt is referred to Dr. Gala Romney.  I spoke with him and he said for the pt to call his office for an appt.  He will let the front desk know that pt will call.   Final Clinical Impressions(s) / ED Diagnoses   Final diagnoses:  Cirrhosis of liver with ascites, unspecified hepatic cirrhosis type ([place])  Atrial fibrillation with RVR ([facility])  Anticoagulated on Coumadin  New Prescriptions New Prescriptions   No medications on file   I personally performed the services described in this documentation, which was scribed in my presence. The recorded information has been reviewed and is accurate.    Isla Pence, MD 01/13/17 [phone]

## 2017-01-19 ENCOUNTER — Inpatient Hospital Stay (HOSPITAL_COMMUNITY)
Admission: EM | Admit: 2017-01-19 | Discharge: 2017-01-21 | DRG: 432 | Disposition: A | Discharge status: Home / Self Care | Payer: Medicare Other | Attending: Family Medicine | Admitting: Family Medicine

## 2017-01-19 ENCOUNTER — Emergency Department (HOSPITAL_COMMUNITY): Payer: Medicare Other

## 2017-01-19 ENCOUNTER — Encounter (HOSPITAL_COMMUNITY): Payer: Self-pay | Admitting: Emergency Medicine

## 2017-01-19 DIAGNOSIS — I10 Essential (primary) hypertension: Secondary | ICD-10-CM | POA: Diagnosis present

## 2017-01-19 DIAGNOSIS — K2980 Duodenitis without bleeding: Secondary | ICD-10-CM | POA: Diagnosis present

## 2017-01-19 DIAGNOSIS — Z7901 Long term (current) use of anticoagulants: Secondary | ICD-10-CM

## 2017-01-19 DIAGNOSIS — N183 Chronic kidney disease, stage 3 (moderate): Secondary | ICD-10-CM | POA: Diagnosis present

## 2017-01-19 DIAGNOSIS — Z79899 Other long term (current) drug therapy: Secondary | ICD-10-CM

## 2017-01-19 DIAGNOSIS — D696 Thrombocytopenia, unspecified: Secondary | ICD-10-CM | POA: Diagnosis not present

## 2017-01-19 DIAGNOSIS — J189 Pneumonia, unspecified organism: Secondary | ICD-10-CM | POA: Diagnosis not present

## 2017-01-19 DIAGNOSIS — D649 Anemia, unspecified: Secondary | ICD-10-CM | POA: Diagnosis present

## 2017-01-19 DIAGNOSIS — I517 Cardiomegaly: Secondary | ICD-10-CM

## 2017-01-19 DIAGNOSIS — K766 Portal hypertension: Secondary | ICD-10-CM | POA: Diagnosis not present

## 2017-01-19 DIAGNOSIS — Z794 Long term (current) use of insulin: Secondary | ICD-10-CM

## 2017-01-19 DIAGNOSIS — R0602 Shortness of breath: Secondary | ICD-10-CM | POA: Diagnosis not present

## 2017-01-19 DIAGNOSIS — Z888 Allergy status to other drugs, medicaments and biological substances status: Secondary | ICD-10-CM

## 2017-01-19 DIAGNOSIS — E1121 Type 2 diabetes mellitus with diabetic nephropathy: Secondary | ICD-10-CM | POA: Diagnosis present

## 2017-01-19 DIAGNOSIS — I4891 Unspecified atrial fibrillation: Secondary | ICD-10-CM | POA: Diagnosis not present

## 2017-01-19 DIAGNOSIS — I509 Heart failure, unspecified: Secondary | ICD-10-CM | POA: Diagnosis not present

## 2017-01-19 DIAGNOSIS — I4510 Unspecified right bundle-branch block: Secondary | ICD-10-CM | POA: Diagnosis present

## 2017-01-19 DIAGNOSIS — E1122 Type 2 diabetes mellitus with diabetic chronic kidney disease: Secondary | ICD-10-CM | POA: Diagnosis present

## 2017-01-19 DIAGNOSIS — K317 Polyp of stomach and duodenum: Secondary | ICD-10-CM | POA: Diagnosis not present

## 2017-01-19 DIAGNOSIS — K7290 Hepatic failure, unspecified without coma: Secondary | ICD-10-CM | POA: Diagnosis not present

## 2017-01-19 DIAGNOSIS — E785 Hyperlipidemia, unspecified: Secondary | ICD-10-CM | POA: Diagnosis present

## 2017-01-19 DIAGNOSIS — M109 Gout, unspecified: Secondary | ICD-10-CM | POA: Diagnosis present

## 2017-01-19 DIAGNOSIS — I130 Hypertensive heart and chronic kidney disease with heart failure and stage 1 through stage 4 chronic kidney disease, or unspecified chronic kidney disease: Secondary | ICD-10-CM | POA: Diagnosis present

## 2017-01-19 DIAGNOSIS — K259 Gastric ulcer, unspecified as acute or chronic, without hemorrhage or perforation: Secondary | ICD-10-CM | POA: Diagnosis not present

## 2017-01-19 DIAGNOSIS — R188 Other ascites: Secondary | ICD-10-CM

## 2017-01-19 DIAGNOSIS — I482 Chronic atrial fibrillation: Secondary | ICD-10-CM | POA: Diagnosis not present

## 2017-01-19 DIAGNOSIS — J9600 Acute respiratory failure, unspecified whether with hypoxia or hypercapnia: Secondary | ICD-10-CM | POA: Diagnosis not present

## 2017-01-19 DIAGNOSIS — Z87891 Personal history of nicotine dependence: Secondary | ICD-10-CM

## 2017-01-19 DIAGNOSIS — N182 Chronic kidney disease, stage 2 (mild): Secondary | ICD-10-CM | POA: Insufficient documentation

## 2017-01-19 DIAGNOSIS — K2970 Gastritis, unspecified, without bleeding: Secondary | ICD-10-CM | POA: Diagnosis not present

## 2017-01-19 DIAGNOSIS — E8770 Fluid overload, unspecified: Secondary | ICD-10-CM | POA: Diagnosis not present

## 2017-01-19 DIAGNOSIS — K7460 Unspecified cirrhosis of liver: Secondary | ICD-10-CM

## 2017-01-19 DIAGNOSIS — I50810 Right heart failure, unspecified: Secondary | ICD-10-CM | POA: Diagnosis present

## 2017-01-19 DIAGNOSIS — I270 Primary pulmonary hypertension: Secondary | ICD-10-CM | POA: Diagnosis not present

## 2017-01-19 DIAGNOSIS — J181 Lobar pneumonia, unspecified organism: Secondary | ICD-10-CM

## 2017-01-19 DIAGNOSIS — D6959 Other secondary thrombocytopenia: Secondary | ICD-10-CM | POA: Diagnosis present

## 2017-01-19 DIAGNOSIS — Z880 Allergy status to penicillin: Secondary | ICD-10-CM

## 2017-01-19 DIAGNOSIS — I2729 Other secondary pulmonary hypertension: Secondary | ICD-10-CM | POA: Diagnosis present

## 2017-01-19 DIAGNOSIS — M1A09X0 Idiopathic chronic gout, multiple sites, without tophus (tophi): Secondary | ICD-10-CM

## 2017-01-19 DIAGNOSIS — K2960 Other gastritis without bleeding: Secondary | ICD-10-CM | POA: Diagnosis not present

## 2017-01-19 DIAGNOSIS — G250 Essential tremor: Secondary | ICD-10-CM | POA: Diagnosis present

## 2017-01-19 DIAGNOSIS — I50811 Acute right heart failure: Secondary | ICD-10-CM | POA: Diagnosis not present

## 2017-01-19 DIAGNOSIS — I4821 Permanent atrial fibrillation: Secondary | ICD-10-CM | POA: Diagnosis present

## 2017-01-19 DIAGNOSIS — Z881 Allergy status to other antibiotic agents status: Secondary | ICD-10-CM

## 2017-01-19 HISTORY — DX: Chronic kidney disease, stage 3 (moderate): N18.3

## 2017-01-19 HISTORY — DX: Essential tremor: G25.0

## 2017-01-19 HISTORY — DX: Unspecified cirrhosis of liver: K74.60

## 2017-01-19 HISTORY — DX: Other ascites: R18.8

## 2017-01-19 HISTORY — DX: Chronic kidney disease, stage 3 unspecified: N18.30

## 2017-01-19 HISTORY — DX: Permanent atrial fibrillation: I48.21

## 2017-01-19 LAB — CBC WITH DIFFERENTIAL/PLATELET
BASOS ABS: 0.1 10*3/uL (ref 0.0–0.1)
BASOS PCT: 1 %
EOS ABS: 0.2 10*3/uL (ref 0.0–0.7)
EOS PCT: 2 %
HCT: 34.2 % — ABNORMAL LOW (ref 39.0–52.0)
HEMOGLOBIN: 11.5 g/dL — AB (ref 13.0–17.0)
LYMPHS ABS: 1.2 10*3/uL (ref 0.7–4.0)
Lymphocytes Relative: 13 %
MCH: 34 pg (ref 26.0–34.0)
MCHC: 33.6 g/dL (ref 30.0–36.0)
MCV: 101.2 fL — ABNORMAL HIGH (ref 78.0–100.0)
Monocytes Absolute: 0.9 10*3/uL (ref 0.1–1.0)
Monocytes Relative: 9 %
NEUTROS PCT: 75 %
Neutro Abs: 7.2 10*3/uL (ref 1.7–7.7)
PLATELETS: 142 10*3/uL — AB (ref 150–400)
RBC: 3.38 MIL/uL — AB (ref 4.22–5.81)
RDW: 18.1 % — ABNORMAL HIGH (ref 11.5–15.5)
WBC: 9.5 10*3/uL (ref 4.0–10.5)

## 2017-01-19 LAB — COMPREHENSIVE METABOLIC PANEL
ALBUMIN: 3.4 g/dL — AB (ref 3.5–5.0)
ALK PHOS: 251 U/L — AB (ref 38–126)
ALT: 21 U/L (ref 17–63)
AST: 35 U/L (ref 15–41)
Anion gap: 9 (ref 5–15)
BUN: 50 mg/dL — AB (ref 6–20)
CALCIUM: 8.9 mg/dL (ref 8.9–10.3)
CO2: 32 mmol/L (ref 22–32)
CREATININE: 1.12 mg/dL (ref 0.61–1.24)
Chloride: 94 mmol/L — ABNORMAL LOW (ref 101–111)
GFR calc non Af Amer: >60 mL/min (ref >60)
GLUCOSE: 88 mg/dL (ref 65–99)
Potassium: 4.1 mmol/L (ref 3.5–5.1)
SODIUM: 135 mmol/L (ref 135–145)
Total Bilirubin: 1.9 mg/dL — ABNORMAL HIGH (ref 0.3–1.2)
Total Protein: 7.9 g/dL (ref 6.5–8.1)

## 2017-01-19 LAB — PROTIME-INR
INR: 3.13
PROTHROMBIN TIME: 32.9 s — AB (ref 11.4–15.2)

## 2017-01-19 LAB — GLUCOSE, CAPILLARY: Glucose-Capillary: 70 mg/dL (ref 65–99)

## 2017-01-19 LAB — AMMONIA: Ammonia: 30 umol/L (ref 9–35)

## 2017-01-19 MED ORDER — DEXTROSE 5 % IV SOLN
500.0000 mg | INTRAVENOUS | Status: DC
  Administered 2017-01-19: 500 mg via INTRAVENOUS
  Filled 2017-01-19: qty 500

## 2017-01-19 MED ORDER — PHYTONADIONE 5 MG PO TABS
10.0000 mg | ORAL_TABLET | Freq: Once | ORAL | Status: AC
  Administered 2017-01-19: 10 mg via ORAL
  Filled 2017-01-19: qty 2

## 2017-01-19 MED ORDER — ACETAMINOPHEN 325 MG PO TABS: 650.0000 mg | ORAL_TABLET | ORAL | Status: DC | PRN

## 2017-01-19 MED ORDER — DILTIAZEM HCL-DEXTROSE 100-5 MG/100ML-% IV SOLN (PREMIX)
5.0000 mg/h | INTRAVENOUS | Status: DC
  Administered 2017-01-19 – 2017-01-20 (×3): 5 mg/h via INTRAVENOUS
  Filled 2017-01-19: qty 100

## 2017-01-19 MED ORDER — ONDANSETRON HCL 4 MG/2ML IJ SOLN: 4.0000 mg | Freq: Four times a day (QID) | INTRAMUSCULAR | Status: DC | PRN

## 2017-01-19 MED ORDER — DILTIAZEM HCL-DEXTROSE 100-5 MG/100ML-% IV SOLN (PREMIX)
5.0000 mg/h | Freq: Once | INTRAVENOUS | Status: AC
  Administered 2017-01-19: 5 mg/h via INTRAVENOUS
  Filled 2017-01-19: qty 100

## 2017-01-19 MED ORDER — ALBUTEROL SULFATE (2.5 MG/3ML) 0.083% IN NEBU
5.0000 mg | INHALATION_SOLUTION | Freq: Once | RESPIRATORY_TRACT | Status: AC
  Administered 2017-01-19: 5 mg via RESPIRATORY_TRACT
  Filled 2017-01-19: qty 6

## 2017-01-19 MED ORDER — SODIUM CHLORIDE 0.9 % IV BOLUS (SEPSIS)
500.0000 mL | Freq: Once | INTRAVENOUS | Status: AC
  Administered 2017-01-19: 500 mL via INTRAVENOUS

## 2017-01-19 MED ORDER — INSULIN ASPART 100 UNIT/ML ~~LOC~~ SOLN: 0.0000 [IU] | Freq: Three times a day (TID) | SUBCUTANEOUS | Status: DC

## 2017-01-19 MED ORDER — INSULIN GLARGINE 100 UNIT/ML SOLOSTAR PEN: 40.0000 [IU] | PEN_INJECTOR | Freq: Every morning | SUBCUTANEOUS | Status: DC

## 2017-01-19 MED ORDER — ORAL CARE MOUTH RINSE
15.0000 mL | Freq: Two times a day (BID) | OROMUCOSAL | Status: DC
  Administered 2017-01-19 – 2017-01-21 (×3): 15 mL via OROMUCOSAL

## 2017-01-19 MED ORDER — CEFTRIAXONE SODIUM 1 G IJ SOLR
1.0000 g | Freq: Once | INTRAMUSCULAR | Status: AC
  Administered 2017-01-19: 1 g via INTRAVENOUS
  Filled 2017-01-19: qty 10

## 2017-01-19 MED ORDER — FUROSEMIDE 80 MG PO TABS
160.0000 mg | ORAL_TABLET | Freq: Two times a day (BID) | ORAL | Status: DC
  Administered 2017-01-20: 160 mg via ORAL
  Filled 2017-01-19: qty 2

## 2017-01-19 MED ORDER — PHYTONADIONE 5 MG PO TABS
ORAL_TABLET | ORAL | Status: AC
  Filled 2017-01-19: qty 2

## 2017-01-19 MED ORDER — INSULIN GLARGINE 100 UNIT/ML ~~LOC~~ SOLN
40.0000 [IU] | Freq: Every day | SUBCUTANEOUS | Status: DC
  Filled 2017-01-19: qty 0.4

## 2017-01-19 MED ORDER — CALCITRIOL 0.25 MCG PO CAPS
0.5000 ug | ORAL_CAPSULE | Freq: Every day | ORAL | Status: DC
  Administered 2017-01-19 – 2017-01-21 (×3): 0.5 ug via ORAL
  Filled 2017-01-19 (×3): qty 2

## 2017-01-19 NOTE — ED Provider Notes (Signed)
[facility] DEPT Provider Note   CSN: [id number] Arrival date & time: 01/19/17  1514     History   Chief Complaint Chief Complaint  Patient presents with  . Shortness of Breath    HPI Melvin Wang is a 76 y.o. male.  Level V caveat for urgent need for intervention. Pt complains of dyspnea worse in the past few days. He is unable to sleep at night. He has cirrhosis and has developed ascites. He was seen here on Monday and sent home. He's gained approximately a 20 pounds recently. He is supposed to see a local gastroenterologist, but is not been to his appointment yet. He is not eating or drinking well. Past medical history includes atrial fibrillation with a rapid ventricular response, extension, diabetes, chronic kidney disease      Past Medical History:  Diagnosis Date  . Abnormality of gait   . Cataract   . Chronic kidney disease    renal insufficiency, sees nephrologist: [facility]  . Diabetes mellitus without complication ([place])   . Dysrhythmia    AFib  . Foot ulcer, right ([place]) 03/2013  . Gout   . Hyperlipidemia   . Hypertension   . Movement disorder    essential tremor    Patient Active Problem List   Diagnosis Date Noted  . Acute respiratory failure ([place]) 01/19/2017  . Cirrhosis of liver with ascites ([place]) 01/19/2017  . Anemia 01/19/2017  . Thrombocytopenia ([place]) 01/19/2017  . Hyperlipemia 06/18/2016  . Petechial rash 08/21/2015  . Gout 01/29/2015  . Atrial fibrillation ([place]) 03/23/2013  . Abnormality of gait 03/18/2013  . Tremor, essential 03/18/2013  . Essential hypertension 03/18/2013  . Controlled type 2 diabetes mellitus with diabetic nephropathy ([place]) 03/18/2013    Past Surgical History:  Procedure Laterality Date  . CATARACT EXTRACTION W/PHACO Left 04/19/2013   Procedure: CATARACT EXTRACTION PHACO AND INTRAOCULAR LENS PLACEMENT (IOC);  Surgeon: Tonny Branch, MD;  Location: [facility];  Service: Ophthalmology;  Laterality: Left;  CDE 29.30   . CATARACT EXTRACTION W/PHACO Right 07/15/2013   Procedure: CATARACT EXTRACTION PHACO AND INTRAOCULAR LENS PLACEMENT (IOC);  Surgeon: Tonny Branch, MD;  Location: [facility];  Service: Ophthalmology;  Laterality: Right;  CDE:12.84       Home Medications    Prior to Admission medications   Medication Sig Start Date End Date Taking? Authorizing Provider  atenolol (TENORMIN) 50 MG tablet TAKE 1 TABLET (50 MG TOTAL) BY MOUTH DAILY. 11/28/16  Yes Claretta Fraise, MD  atorvastatin (LIPITOR) 40 MG tablet TAKE 1 TABLET BY MOUTH AT BEDTIME 10/08/16  Yes Claretta Fraise, MD  calcitRIOL (ROCALTROL) 0.5 MCG capsule daily   Yes Lysbeth Penner, FNP  furosemide (LASIX) 80 MG tablet TAKE 2 TABLETS (160 MG TOTAL) BY MOUTH 2 (TWO) TIMES DAILY. 11/04/16  Yes Claretta Fraise, MD  LANTUS SOLOSTAR 100 UNIT/ML Solostar Pen INJECT 40 UNITS INTO THE SKIN EVERY MORNING. 09/25/16  Yes Claretta Fraise, MD  ONE TOUCH ULTRA TEST test strip USE TO CHECK BG UP TO 4 TIMES A DAY 05/27/16  Yes Claretta Fraise, MD  warfarin (COUMADIN) 10 MG tablet Take as directed by anticoagulation dose - 1/2 to 1 tablet daily-Take no Warfarin on Monday or Friday 05/05/15  Yes Cherre Robins, PharmD  B-D UF III MINI PEN NEEDLES 31G X 5 MM MISC USE 1 TIME DAILY WITH LANTUS SOLOSTAR 06/05/16   Claretta Fraise, MD    Family History Family History  Problem Relation Age of Onset  . Cancer Father  rectal  . Cancer Paternal Uncle     Social History Social History  Substance Use Topics  . Smoking status: Former Smoker    Packs/day: 1.00    Years: 15.00    Types: Cigarettes    Quit date: 07/07/1984  . Smokeless tobacco: Never Used  . Alcohol use No     Allergies   Acyclovir and related; Metformin and related; Amoxicillin; Augmentin [amoxicillin-pot clavulanate]; and Mavik [trandolapril]   Review of Systems Review of Systems  Reason unable to perform ROS: Urgent need for intervention.     Physical Exam Updated Vital Signs BP 101/60   Pulse  97   Temp 97.4 F (36.3 C) (Oral)   Resp 19   Ht 6\' 1"  (1.854 m)   Wt 270 lb (122.5 kg)   SpO2 (!) 89%   BMI 35.62 kg/m   Physical Exam  Constitutional: He is oriented to person, place, and time.  Appears ill.  HENT:  Head: Normocephalic and atraumatic.  Eyes: Conjunctivae are normal.  Neck: Neck supple.  Cardiovascular: Regular rhythm.   Tachycardic  Pulmonary/Chest: Effort normal and breath sounds normal.  Abdominal:  Obvious ascites  Musculoskeletal: Normal range of motion.  Neurological: He is alert and oriented to person, place, and time.  Skin: Skin is warm and dry.  Psychiatric: He has a normal mood and affect. His behavior is normal.  Nursing note and vitals reviewed.    ED Treatments / Results  Labs (all labs ordered are listed, but only abnormal results are displayed) Labs Reviewed  CBC WITH DIFFERENTIAL/PLATELET - Abnormal; Notable for the following:       Result Value   RBC 3.38 (*)    Hemoglobin 11.5 (*)    HCT 34.2 (*)    MCV 101.2 (*)    RDW 18.1 (*)    Platelets 142 (*)    All other components within normal limits  COMPREHENSIVE METABOLIC PANEL - Abnormal; Notable for the following:    Chloride 94 (*)    BUN 50 (*)    Albumin 3.4 (*)    Alkaline Phosphatase 251 (*)    Total Bilirubin 1.9 (*)    All other components within normal limits  MRSA PCR SCREENING  AMMONIA  PROTIME-INR    EKG  EKG Interpretation None       Radiology Dg Chest 2 View  Result Date: 01/19/2017 CLINICAL DATA:  Shortness of breath, dyspnea with exertion EXAM: CHEST  2 VIEW COMPARISON:  None. FINDINGS: Patchy left lower lobe opacity, suspicious for pneumonia, less likely atelectasis. Right lung is clear. No frank interstitial edema. Possible small left pleural effusion. No pneumothorax. Mild cardiomegaly. IMPRESSION: Patchy left lower lobe opacity, suspicious for pneumonia, less likely atelectasis. Possible small left pleural effusion. Electronically Signed   By:  Julian Hy M.D.   On: 01/19/2017 16:32    Procedures Procedures (including critical care time)  Medications Ordered in ED Medications  azithromycin (ZITHROMAX) 500 mg in dextrose 5 % 250 mL IVPB (0 mg Intravenous Stopped 01/19/17 1836)  albuterol (PROVENTIL) (2.5 MG/3ML) 0.083% nebulizer solution 5 mg (5 mg Nebulization Given 01/19/17 1534)  sodium chloride 0.9 % bolus 500 mL (0 mLs Intravenous Stopped 01/19/17 1835)  cefTRIAXone (ROCEPHIN) 1 g in dextrose 5 % 50 mL IVPB (0 g Intravenous Stopped 01/19/17 1809)  diltiazem (CARDIZEM) 100 mg in dextrose 5% 172mL (1 mg/mL) infusion (5 mg/hr Intravenous New Bag/Given 01/19/17 1739)     Initial Impression / Assessment and Plan / ED Course  I have reviewed the triage vital signs and the nursing notes.  Pertinent labs & imaging results that were available during my care of the patient were reviewed by me and considered in my medical decision making (see chart for details).   CRITICAL CARE Performed by: Nat Christen Total critical care time: 30 minutes Critical care time was exclusive of separately billable procedures and treating other patients. Critical care was necessary to treat or prevent imminent or life-threatening deterioration. Critical care was time spent personally by me on the following activities: development of treatment plan with patient and/or surrogate as well as nursing, discussions with consultants, evaluation of patient's response to treatment, examination of patient, obtaining history from patient or surrogate, ordering and performing treatments and interventions, ordering and review of laboratory studies, ordering and review of radiographic studies, pulse oximetry and re-evaluation of patient's condition.   Patient has several concerning issues. He has developed significant ascites, has atrial fibrillation with a rapid ventricular response, and a left lower lobe opacity on his chest x-ray suspicious for pneumonia. He will  most likely need a paracentesis. Intravenous antibiotics initiated, IV Cardizem, admit to general medicine.   Final Clinical Impressions(s) / ED Diagnoses   Final diagnoses:  Community acquired pneumonia of left lower lobe of lung ([place])  Cirrhosis of liver with ascites, unspecified hepatic cirrhosis type [facility])  Atrial fibrillation, unspecified type [facility])    New Prescriptions New Prescriptions   No medications on file     Nat Christen, MD 01/19/17 2038

## 2017-01-19 NOTE — ED Triage Notes (Signed)
Patient complains of shortness of breath with dyspnea with exertion. States unable to sleep due to unable to breath when trying to sleep.  Was seen on Monday and diagnosed with cirrhoses of liver.

## 2017-01-19 NOTE — H&P (Signed)
History and Physical    Melvin Wang:[id number] DOB: 1941-10-12 DOA: 01/19/2017  PCP: Claretta Fraise, MD Consultants:  [facility] - nephrology; GI - appt to see Dr. Gala Romney on Tuesday for the first time; Karsten Ro - urology Patient coming from: home - lives with wife; NOK: wife, [phone]  Chief Complaint: SOB  HPI: Melvin Wang is a 76 y.o. [place] male with medical history significant of HTN, afib on Coumadin, HLD, DM, CKD, and essential tremor presenting with SOB after recent diagnosis of cirrhosis with ascites.  He reports that he can't eat, can't breathe properly, can't sleep - just 1 hour again last night, can't get a good cough.  Came in last Monday, symptoms are no different today than a week ago.  He had a CT scan last week and was referred to  GI as an outpatient.   Saw urology in December due to hematuria, had a CT then too that apparently showed the cirrhosis (not available in Epic) but the family reports that they weren't told about the cirrhosis at that time.  About 3 weeks ago, his belly started to suddenly enlarge, couldn't figure out why he was suddenly gaining weight.  They have had a very stressful few months and so thought it was due to poor eating habits in the setting of stress.  It got much worse quickly and so they came in.  Has not had a fever, nothing to suspect PNA other than chronic cough and difficulty coughing up secretions.    Moderately heavy drinker for 2 years very remotely, social drinking for several years also very remotely.  Overall, little alcohol lifetime ingestion.  Afib for about 30 years.  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  Multiple electrical cardioversions without success.   Rate control 90s-120s is pretty regular for him.  Atenolol for rate control.  Takes Coumadin, taking still and took 5 mg last night.    In the ER, given Rocephin and Azithromycin for CAP and started on Cardizem drip at 5/hr for afib with RVR.  Review  of Systems: As per HPI; otherwise 10 point review of systems reviewed and negative.   Ambulatory Status:  ambulates with a cane  Past Medical History:  Diagnosis Date  . Abnormality of gait   . Cataract   . Chronic kidney disease    renal insufficiency, sees nephrologist: [facility]  . Diabetes mellitus without complication ([place])   . Dysrhythmia    AFib  . Foot ulcer, right ([place]) 03/2013  . Gout   . Hyperlipidemia   . Hypertension   . Movement disorder    essential tremor    Past Surgical History:  Procedure Laterality Date  . CATARACT EXTRACTION W/PHACO Left 04/19/2013   Procedure: CATARACT EXTRACTION PHACO AND INTRAOCULAR LENS PLACEMENT (IOC);  Surgeon: Tonny Branch, MD;  Location: [facility];  Service: Ophthalmology;  Laterality: Left;  CDE 29.30  . CATARACT EXTRACTION W/PHACO Right 07/15/2013   Procedure: CATARACT EXTRACTION PHACO AND INTRAOCULAR LENS PLACEMENT (IOC);  Surgeon: Tonny Branch, MD;  Location: [facility];  Service: Ophthalmology;  Laterality: Right;  CDE:12.84    Social History   Social History  . Marital status: Married    Spouse name: N/A  . Number of children: N/A  . Years of education: N/A   Occupational History  . retired Dealer    Social History Main Topics  . Smoking status: Former Smoker    Packs/day: 1.00    Years: 15.00    Types: Cigarettes  Quit date: 07/07/1984  . Smokeless tobacco: Never Used  . Alcohol use No  . Drug use: No  . Sexual activity: Yes    Birth control/ protection: None   Other Topics Concern  . Not on file   Social History Narrative  . No narrative on file    Allergies  Allergen Reactions  . Acyclovir And Related Hives  . Metformin And Related Diarrhea  . Amoxicillin Hives  . Augmentin [Amoxicillin-Pot Clavulanate] Hives  . Mavik [Trandolapril] Other (See Comments)    Caused renal problems    Family History  Problem Relation Age of Onset  . Cancer Father     rectal  . Cancer Paternal Uncle     Prior to  Admission medications   Medication Sig Start Date End Date Taking? Authorizing Provider  atenolol (TENORMIN) 50 MG tablet TAKE 1 TABLET (50 MG TOTAL) BY MOUTH DAILY. 11/28/16  Yes Claretta Fraise, MD  atorvastatin (LIPITOR) 40 MG tablet TAKE 1 TABLET BY MOUTH AT BEDTIME 10/08/16  Yes Claretta Fraise, MD  calcitRIOL (ROCALTROL) 0.5 MCG capsule daily   Yes Lysbeth Penner, FNP  furosemide (LASIX) 80 MG tablet TAKE 2 TABLETS (160 MG TOTAL) BY MOUTH 2 (TWO) TIMES DAILY. 11/04/16  Yes Claretta Fraise, MD  LANTUS SOLOSTAR 100 UNIT/ML Solostar Pen INJECT 40 UNITS INTO THE SKIN EVERY MORNING. 09/25/16  Yes Claretta Fraise, MD  ONE TOUCH ULTRA TEST test strip USE TO CHECK BG UP TO 4 TIMES A DAY 05/27/16  Yes Claretta Fraise, MD  warfarin (COUMADIN) 10 MG tablet Take as directed by anticoagulation dose - 1/2 to 1 tablet daily-Take no Warfarin on Monday or Friday 05/05/15  Yes Cherre Robins, PharmD  B-D UF III MINI PEN NEEDLES 31G X 5 MM MISC USE 1 TIME DAILY WITH LANTUS SOLOSTAR 06/05/16   Claretta Fraise, MD    Physical Exam: Vitals:   01/19/17 1800 01/19/17 1830 01/19/17 1830 01/19/17 1930  BP: 110/69 101/70  98/82  Pulse: 110 (!) 51 109 (!) 44  Resp: 21 25  24   Temp:      TempSrc:      SpO2: 91% 95%  92%  Weight:      Height:         General:  Appears calm and comfortable and is NAD while at rest sitting very upright Eyes:  EOMI, normal lids, iris ENT:  grossly normal hearing, lips & tongue, mmm Neck:  no LAD, masses or thyromegaly Cardiovascular:  Irregularly irregular rate/rhythm, mostly in goal range on 5 of Cardizem, no m/r/g.  Respiratory:  CTA bilaterally other than LLL crackles, no w/r/r. Normal respiratory effort. Abdomen: Marked tense ascites. Skin: brawny LE discoloration associated with edema Musculoskeletal: grossly normal tone BUE/BLE, good ROM, no bony abnormality, marked pitting LE edema Psychiatric: grossly normal mood and affect, speech fluent and appropriate, AOx3 Neurologic:  CN 2-12  grossly intact, moves all extremities in coordinated fashion, sensation intact; movement disorder with tremor and vague fidgeting/movement  Labs on Admission: I have personally reviewed following labs and imaging studies  CBC:  Recent Labs Lab 01/13/17 0934 01/19/17 1545  WBC 11.1* 9.5  NEUTROABS 8.8* 7.2  HGB 11.5* 11.5*  HCT 33.2* 34.2*  MCV 100.6* 101.2*  PLT 121* 921*   Basic Metabolic Panel:  Recent Labs Lab 01/13/17 0934 01/19/17 1545  NA 132* 135  K 4.1 4.1  CL 92* 94*  CO2 29 32  GLUCOSE 75 88  BUN 45* 50*  CREATININE 1.24 1.12  CALCIUM  8.9 8.9   GFR: Estimated Creatinine Clearance: 80.5 mL/min (by C-G formula based on SCr of 1.12 mg/dL). Liver Function Tests:  Recent Labs Lab 01/13/17 0934 01/19/17 1545  AST 38 35  ALT 21 21  ALKPHOS 241* 251*  BILITOT 2.4* 1.9*  PROT 7.5 7.9  ALBUMIN 3.5 3.4*    Recent Labs Lab 01/13/17 0934  LIPASE 22    Recent Labs Lab 01/19/17 1545  AMMONIA 30   Coagulation Profile:  Recent Labs Lab 01/13/17 0934  INR 2.31   Cardiac Enzymes: No results for input(s): CKTOTAL, CKMB, CKMBINDEX, TROPONINI in the last 168 hours. BNP (last 3 results) No results for input(s): PROBNP in the last 8760 hours. HbA1C: No results for input(s): HGBA1C in the last 72 hours. CBG: No results for input(s): GLUCAP in the last 168 hours. Lipid Profile: No results for input(s): CHOL, HDL, LDLCALC, TRIG, CHOLHDL, LDLDIRECT in the last 72 hours. Thyroid Function Tests: No results for input(s): TSH, T4TOTAL, FREET4, T3FREE, THYROIDAB in the last 72 hours. Anemia Panel: No results for input(s): VITAMINB12, FOLATE, FERRITIN, TIBC, IRON, RETICCTPCT in the last 72 hours. Urine analysis:    Component Value Date/Time   COLORURINE YELLOW 02/19/2014 1612   APPEARANCEUR CLEAR 02/19/2014 1612   APPEARANCEUR Clear 09/28/2013 0845   LABSPEC 1.010 02/19/2014 1612   PHURINE 5.5 02/19/2014 1612   GLUCOSEU NEGATIVE 02/19/2014 1612   HGBUR  NEGATIVE 02/19/2014 1612   BILIRUBINUR neg 12/07/2015 0939   BILIRUBINUR Negative 09/28/2013 0845   KETONESUR NEGATIVE 02/19/2014 1612   PROTEINUR neg 12/07/2015 0939   PROTEINUR NEGATIVE 02/19/2014 1612   UROBILINOGEN negative 12/07/2015 0939   UROBILINOGEN 0.2 02/19/2014 1612   NITRITE neg 12/07/2015 0939   NITRITE NEGATIVE 02/19/2014 1612   LEUKOCYTESUR Negative 12/07/2015 0939   LEUKOCYTESUR Negative 09/28/2013 0845    Creatinine Clearance: Estimated Creatinine Clearance: 80.5 mL/min (by C-G formula based on SCr of 1.12 mg/dL).  Sepsis Labs: @LABRCNTIP (procalcitonin:4,lacticidven:4) )No results found for this or any previous visit (from the past 240 hour(s)).   Radiological Exams on Admission: Dg Chest 2 View  Result Date: 01/19/2017 CLINICAL DATA:  Shortness of breath, dyspnea with exertion EXAM: CHEST  2 VIEW COMPARISON:  None. FINDINGS: Patchy left lower lobe opacity, suspicious for pneumonia, less likely atelectasis. Right lung is clear. No frank interstitial edema. Possible small left pleural effusion. No pneumothorax. Mild cardiomegaly. IMPRESSION: Patchy left lower lobe opacity, suspicious for pneumonia, less likely atelectasis. Possible small left pleural effusion. Electronically Signed   By: Julian Hy M.D.   On: 01/19/2017 16:32    EKG: Independently reviewed.  Afib with rate 128; no evidence of acute ischemia  Assessment/Plan Principal Problem:   Cirrhosis of liver with ascites (HCC) Active Problems:   Tremor, essential   Essential hypertension   Controlled type 2 diabetes mellitus with diabetic nephropathy (HCC)   Atrial fibrillation (HCC)   Gout   Hyperlipemia   Acute respiratory failure (HCC)   Anemia   Thrombocytopenia (HCC)   Cirrhosis of liver with ascites -Patient with several weeks of abdominal bloating and apparent ascites, which acutely worsened 1 week ago -Cirrhosis diagnosed via CT on 2/5, with moderate to large-volume ascites at that  time -This is a new diagnosis -Uncertain etiology, unlikely to be related to alcohol -With elevated Alk Phos (AP 251, 241 on 2/15, 476 on 10/16), there is concern for underlying hepatic malignancy; non-alcoholic cirrhosis is also likely -Worsening albumin 3.4, 3.5 on 2/5, 3.7 on 10/16 -Total bilirubin 1.9, 2.4 on  2/5 -Patient was referred for outpatient evaluation, but has failed outpatient management given progressive/refractory symptoms (unable to eat, sleep, breathe) -Will admit for inpatient evaluation -IR consult for paracentesis, hopefully tomorrow; will need both diagnostic and therapeutic paracentesis -GI consult as inpatient -He is currently taking Atenolol for HTN; given 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  Atenolol currently held, will defer to day team. -Continue Lasix 160 mg PO BID for now, although he may require less once his ascites is drained -His current complaint of respiratory failure is most likely associated with his significant ascites; while acute pneumonia is possible, he does not have fever or leukocytosis and does not complain of acute illness but rather ongoing SOB associated with his ascites.  Will hold further antibiotics at this time.  Afib -His current rate may not be markedly different from his baseline, according to the patient and his family -The rate control may also be more poorly controlled than baseline due to his prominent ascites and increased intraabdominal pressure -He was started on 5/hr Cardizem in the ER and had reasonable rate control throughout my evaluation -Will admit to SDU and continue Cardizem drip for now  -He is rate controlled with Atenolol as an outpatient; as above, will stop this at this time and would consider use of Propranolol instead -He is anticoagulated with Coumadin for CHA2DS2-VASc score of 3, estimated stroke rate of 3.2% per year -His INR today  is pending, but was 2.31 on 2/5 -Will give vitamin K 10 mg PO x 1 now in an effort to improve INR so that he can have paracentesis more safely tomorrow; he understands the low risk of being off anticoagulation for the procedure and is agreeable to this -He should not need a Heparin drip for this short duration of time, but may need a bridge following the procedure -Recheck INR in the AM  HTN -He is taking Atenolol monotherapy at home -As above, will hold Atenolol for now -Consider Propranolol instead for portal HTN and tremor when no longer requiring Cardizem drip -PO Cardizem would be a reasonable rate-controlling option, in addition  DM -No recent A1c (last in Epic was 7/0 in 12/16), will order -Continue lantus -Cover with SSI  CKD -Creatinine 1.12, improved  Anemia -Hgb 11.5, stable  Thrombocytopenia -Platelets 142, improved -Avoid heparin-containing agents and NSAIDs if platelets <100  Tremor -Consider Propranolol, as above  Gout -He has been taking Allopurinol for some time -Most recent flare was 3-4 weeks ago while on Allopurinol -This medication was stopped by Dr. Livia Snellen on 2/5  -Certainly, there are risks associated with this medication in this patient; however, given the frequency of his attacks even while on the medication, he may continue to benefit from it. -Will defer to day team/PCP  HLD -Most recent FLP was 09/23/16 and was: Total 77; HDL 32; LDL 33; TG 62 -With his current hepatic failure, will discontinue this medication at this time as it appears his benefit is likely less than the associated risks   DVT prophylaxis: SCDs Code Status: Full - confirmed with patient/family Family Communication: Wife and daughter present throughout evaluation Disposition Plan:  Home once clinically improved Consults called: IR and GI Admission status: Admit - It is my clinical opinion that admission to INPATIENT is reasonable and necessary because this patient will require  at least 2 midnights in the hospital to treat this condition based on the medical complexity of  the problems presented.  Given the aforementioned information, the predictability of an adverse outcome is felt to be significant.    Karmen Bongo MD [facility]  If 7PM-7AM, please contact night-coverage [email or web address] Password [facility]  01/19/2017, 8:29 PM

## 2017-01-20 ENCOUNTER — Encounter (HOSPITAL_COMMUNITY): Payer: Self-pay | Admitting: Gastroenterology

## 2017-01-20 ENCOUNTER — Inpatient Hospital Stay (HOSPITAL_COMMUNITY): Payer: Medicare Other

## 2017-01-20 DIAGNOSIS — D649 Anemia, unspecified: Secondary | ICD-10-CM

## 2017-01-20 DIAGNOSIS — I509 Heart failure, unspecified: Secondary | ICD-10-CM

## 2017-01-20 DIAGNOSIS — K7460 Unspecified cirrhosis of liver: Principal | ICD-10-CM

## 2017-01-20 LAB — BODY FLUID CELL COUNT WITH DIFFERENTIAL
EOS FL: 0 %
LYMPHS FL: 42 %
MONOCYTE-MACROPHAGE-SEROUS FLUID: 49 % — AB (ref 50–90)
Neutrophil Count, Fluid: 9 % (ref 0–25)
OTHER CELLS FL: 0 %
Total Nucleated Cell Count, Fluid: 394 cu mm (ref 0–1000)

## 2017-01-20 LAB — BASIC METABOLIC PANEL
Anion gap: 9 (ref 5–15)
BUN: 48 mg/dL — ABNORMAL HIGH (ref 6–20)
CALCIUM: 8.8 mg/dL — AB (ref 8.9–10.3)
CO2: 30 mmol/L (ref 22–32)
CREATININE: 1.06 mg/dL (ref 0.61–1.24)
Chloride: 96 mmol/L — ABNORMAL LOW (ref 101–111)
GFR calc Af Amer: >60 mL/min (ref >60)
Glucose, Bld: 48 mg/dL — ABNORMAL LOW (ref 65–99)
Potassium: 3.7 mmol/L (ref 3.5–5.1)
SODIUM: 135 mmol/L (ref 135–145)

## 2017-01-20 LAB — CBC
HCT: 32.2 % — ABNORMAL LOW (ref 39.0–52.0)
Hemoglobin: 10.9 g/dL — ABNORMAL LOW (ref 13.0–17.0)
MCH: 34.3 pg — AB (ref 26.0–34.0)
MCHC: 33.9 g/dL (ref 30.0–36.0)
MCV: 101.3 fL — ABNORMAL HIGH (ref 78.0–100.0)
PLATELETS: 138 10*3/uL — AB (ref 150–400)
RBC: 3.18 MIL/uL — ABNORMAL LOW (ref 4.22–5.81)
RDW: 18.2 % — AB (ref 11.5–15.5)
WBC: 9.3 10*3/uL (ref 4.0–10.5)

## 2017-01-20 LAB — GLUCOSE, CAPILLARY
GLUCOSE-CAPILLARY: 37 mg/dL — AB (ref 65–99)
GLUCOSE-CAPILLARY: 78 mg/dL (ref 65–99)
Glucose-Capillary: 80 mg/dL (ref 65–99)
Glucose-Capillary: 86 mg/dL (ref 65–99)
Glucose-Capillary: 102 mg/dL — ABNORMAL HIGH (ref 65–99)

## 2017-01-20 LAB — GRAM STAIN

## 2017-01-20 LAB — ALBUMIN, FLUID (OTHER): ALBUMIN FL: 1.6 g/dL

## 2017-01-20 LAB — MRSA PCR SCREENING: MRSA BY PCR: NEGATIVE

## 2017-01-20 LAB — PROTIME-INR
INR: 2.98
PROTHROMBIN TIME: 31.6 s — AB (ref 11.4–15.2)

## 2017-01-20 LAB — TRANSFERRIN: TRANSFERRIN: 193 mg/dL (ref 180–329)

## 2017-01-20 LAB — FERRITIN: FERRITIN: 196 ng/mL (ref 24–336)

## 2017-01-20 MED ORDER — DEXTROSE 50 % IV SOLN
INTRAVENOUS | Status: AC
  Administered 2017-01-20: 50 mL
  Filled 2017-01-20: qty 50

## 2017-01-20 MED ORDER — DIPHENHYDRAMINE HCL 25 MG PO CAPS: 50.0000 mg | ORAL_CAPSULE | Freq: Once | ORAL | Status: DC

## 2017-01-20 MED ORDER — SPIRONOLACTONE 100 MG PO TABS
100.0000 mg | ORAL_TABLET | Freq: Every day | ORAL | Status: DC
  Administered 2017-01-21: 100 mg via ORAL
  Filled 2017-01-20: qty 1

## 2017-01-20 MED ORDER — PERFLUTREN LIPID MICROSPHERE
1.0000 mL | INTRAVENOUS | Status: AC | PRN
  Administered 2017-01-20: 2 mL via INTRAVENOUS
  Administered 2017-01-20: 1 mL via INTRAVENOUS
  Filled 2017-01-20: qty 10

## 2017-01-20 MED ORDER — DIPHENHYDRAMINE HCL 50 MG/ML IJ SOLN
INTRAMUSCULAR | Status: AC
  Filled 2017-01-20: qty 1

## 2017-01-20 MED ORDER — DIPHENHYDRAMINE HCL 50 MG/ML IJ SOLN
50.0000 mg | Freq: Once | INTRAMUSCULAR | Status: AC
  Administered 2017-01-20: 50 mg via INTRAVENOUS

## 2017-01-20 MED ORDER — PROPRANOLOL HCL 20 MG PO TABS
20.0000 mg | ORAL_TABLET | Freq: Four times a day (QID) | ORAL | Status: DC
  Administered 2017-01-20 – 2017-01-21 (×4): 20 mg via ORAL
  Filled 2017-01-20 (×4): qty 1

## 2017-01-20 MED ORDER — FUROSEMIDE 40 MG PO TABS
40.0000 mg | ORAL_TABLET | Freq: Every day | ORAL | Status: DC
  Administered 2017-01-21: 40 mg via ORAL
  Filled 2017-01-20: qty 1

## 2017-01-20 MED ORDER — FUROSEMIDE 80 MG PO TABS: 160.0000 mg | ORAL_TABLET | Freq: Two times a day (BID) | ORAL | Status: DC

## 2017-01-20 MED ORDER — ALBUMIN HUMAN 25 % IV SOLN
25.0000 g | INTRAVENOUS | Status: AC
  Administered 2017-01-20: 25 g via INTRAVENOUS
  Filled 2017-01-20: qty 100

## 2017-01-20 NOTE — Progress Notes (Signed)
PROGRESS NOTE    Melvin Wang  KCL:[id number] DOB: March 20, 1941 DOA: 01/19/2017 PCP: Claretta Fraise, MD    Brief Narrative:  Melvin Wang is a 76 y.o. [place] male with medical history significant of HTN, afib on Coumadin, HLD, DM, CKD, and essential tremor presenting with SOB after recent diagnosis of cirrhosis with ascites.  He reports that he can't eat, can't breathe properly, can't sleep - just 1 hour again last night, can't get a good cough.  Came in last Monday, symptoms are no different today than a week ago.  He had a CT scan last week and was referred to  GI as an outpatient.   Saw urology in December due to hematuria, had a CT then too that apparently showed the cirrhosis (not available in Epic) but the family reports that they weren't told about the cirrhosis at that time.  About 3 weeks ago, his belly started to suddenly enlarge, couldn't figure out why he was suddenly gaining weight.  They have had a very stressful few months and so thought it was due to poor eating habits in the setting of stress.  It got much worse quickly and so they came in.  Has not had a fever, nothing to suspect PNA other than chronic cough and difficulty coughing up secretions.    Moderately heavy drinker for 2 years very remotely, social drinking for several years also very remotely.  Overall, little alcohol lifetime ingestion. Afib for about 30 years.  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  Multiple electrical cardioversions without success.   Rate control 90s-120s is pretty regular for him.  Atenolol for rate control.  Takes Coumadin, taking still and took 5 mg last night.    In the ER, given Rocephin and Azithromycin for CAP and started on Cardizem drip at 5/hr for afib with RVR.   Assessment & Plan:   Principal Problem:   Cirrhosis of liver with ascites (HCC) Active Problems:   Tremor, essential   Essential hypertension   Controlled type 2 diabetes mellitus with diabetic  nephropathy (HCC)   Atrial fibrillation (HCC)   Gout   Hyperlipemia   Acute respiratory failure (HCC)   Anemia   Thrombocytopenia (HCC)   Cirrhosis of liver with ascites -Patient with several weeks of abdominal bloating and apparent ascites, which acutely worsened 1 week ago -Cirrhosis diagnosed via CT on 2/5, with moderate to large-volume ascites at that time - elevated Alk Phos (AP 251, 241 on 2/15, 476 on 10/16), there is concern for underlying hepatic malignancy; non-alcoholic cirrhosis is also likely -Worsening albumin 3.4, 3.5 on 2/5, 3.7 on 10/16 -Total bilirubin 1.9, 2.4 on 2/5 -Paracentesis performed today- 4L off - GI consult as inpatient - Continue Lasix 160 mg PO BID for now, although he may require less once his ascites is drained  Afib - started on 5/hr Cardizem in the ER and had reasonable rate control throughout my evaluation -Transitioned off cardizem to propranolol -He is anticoagulated with Coumadin for CHA2DS2-VASc score of 3, estimated stroke rate of 3.2% per year -s/p vitamin K 10 mg PO x 1 now in an effort to improve INR so that he can have paracentesis more safely tomorrow; he understands the low risk of being off anticoagulation for the procedure and is agreeable to this - monitor heart rate - INR 2.98 this am  HTN - taking Atenolol monotherapy at home -As above, will hold Atenolol for now - 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 Livia Snellen on 2/5  -Certainly, there are risks associated with this medication in this patient; however, given the frequency of his attacks even while on the medication, he may continue to benefit from it. - can consider restarting after EGD  HLD -Most recent FLP was 09/23/16 and was: Total 77; HDL 32; LDL 33; TG 62 -Holding statin   DVT prophylaxis: SCDs Code Status: Full - confirmed with patient/family Family Communication: Wife bedside Disposition Plan:  Home once clinically improved   Consultants:   IR  GI  Diabetes Coordinator  Procedures:   Paracentesis  Antimicrobials:   None    Subjective: Patient seen this morning with GI.  Discussed transition off Diltiazem to different medication (Propranolol).  Discussed paracentesis that took place today.  Answered all questions asked by patient and wife.  Objective: Vitals:   01/20/17 0800 01/20/17 0806 01/20/17 1116 01/20/17 1515  BP: 115/68   99/73  Pulse: (!) 109 90 86 99  Resp: (!) 22 (!) 23 18   Temp:  97.6 F (36.4 C) 97.6 F (36.4 C)   TempSrc:  Oral Oral   SpO2: 93% 94% 94%   Weight:      Height:        Intake/Output Summary (Last 24 hours) at 01/20/17 1550 Last data filed at 01/20/17 1300  Gross per 24 hour  Intake             1440 ml  Output             1100 ml  Net              340 ml   Filed Weights   01/19/17 1522 01/19/17 2113 01/20/17 0500  Weight: 122.5 kg (270 lb) 124.3 kg (274 lb 0.5 oz) 124.3 kg (274 lb 0.5 oz)    Examination:  General exam: Appears calm and comfortable  Respiratory system: Clear to auscultation. Respiratory effort normal. Cardiovascular system: S1 & S2 heard, tachycardic, irregular rate. No JVD, murmurs, rubs, gallops or clicks. No pedal edema. Gastrointestinal system: Abdomen is distended, soft and nontender.  Appreciable ascites noted. No organomegaly or masses felt. Normal bowel sounds  heard. Central nervous system: Alert and oriented. No focal neurological deficits. Extremities: Symmetric 5 x 5 power. Skin: No rashes, lesions or ulcers Psychiatry: Judgement and insight appear normal. Mood & affect appropriate.     Data Reviewed: I have personally reviewed following labs and imaging studies  CBC:  Recent Labs Lab 01/19/17 1545 01/20/17 0420  WBC 9.5 9.3  NEUTROABS 7.2  --   HGB 11.5* 10.9*  HCT 34.2* 32.2*  MCV 101.2* 101.3*  PLT 142* 161*   Basic Metabolic Panel:  Recent Labs Lab 01/19/17 1545 01/20/17 0420  NA 135 135  K 4.1 3.7  CL 94* 96*  CO2 32 30  GLUCOSE 88 48*  BUN 50* 48*  CREATININE 1.12 1.06  CALCIUM 8.9 8.8*   GFR: Estimated Creatinine Clearance: 85.8 mL/min (by C-G  formula based on SCr of 1.06 mg/dL). Liver Function Tests:  Recent Labs Lab 01/19/17 1545  AST 35  ALT 21  ALKPHOS 251*  BILITOT 1.9*  PROT 7.9  ALBUMIN 3.4*   No results for input(s): LIPASE, AMYLASE in the last 168 hours.  Recent Labs Lab 01/19/17 1545  AMMONIA 30   Coagulation Profile:  Recent Labs Lab 01/19/17 1545 01/20/17 0420  INR 3.13 2.98   Cardiac Enzymes: No results for input(s): CKTOTAL, CKMB, CKMBINDEX, TROPONINI in the last 168 hours. BNP (last 3 results) No results for input(s): PROBNP in the last 8760 hours. HbA1C: No results for input(s): HGBA1C in the last 72 hours. CBG:  Recent Labs Lab 01/19/17 2142 01/20/17 0801 01/20/17 0842 01/20/17 1117  GLUCAP 70 37* 80 78   Lipid Profile: No results for input(s): CHOL, HDL, LDLCALC, TRIG, CHOLHDL, LDLDIRECT in the last 72 hours. Thyroid Function Tests: No results for input(s): TSH, T4TOTAL, FREET4, T3FREE, THYROIDAB in the last 72 hours. Anemia Panel: No results for input(s): VITAMINB12, FOLATE, FERRITIN, TIBC, IRON, RETICCTPCT in the last 72 hours. Sepsis Labs: No results for input(s): PROCALCITON, LATICACIDVEN in the last 168 hours.  Recent Results (from the past 240  hour(s))  MRSA PCR Screening     Status: None   Collection Time: 01/19/17  9:07 PM  Result Value Ref Range Status   MRSA by PCR NEGATIVE NEGATIVE Final    Comment:        The GeneXpert MRSA Assay (FDA approved for NASAL specimens only), is one component of a comprehensive MRSA colonization surveillance program. It is not intended to diagnose MRSA infection nor to guide or monitor treatment for MRSA infections.          Radiology Studies: Dg Chest 2 View  Result Date: 01/19/2017 CLINICAL DATA:  Shortness of breath, dyspnea with exertion EXAM: CHEST  2 VIEW COMPARISON:  None. FINDINGS: Patchy left lower lobe opacity, suspicious for pneumonia, less likely atelectasis. Right lung is clear. No frank interstitial edema. Possible small left pleural effusion. No pneumothorax. Mild cardiomegaly. IMPRESSION: Patchy left lower lobe opacity, suspicious for pneumonia, less likely atelectasis. Possible small left pleural effusion. Electronically Signed   By: Julian Hy M.D.   On: 01/19/2017 16:32   US Paracentesis  Result Date: 01/20/2017 INDICATION: Ascites EXAM: ULTRASOUND GUIDED DIAGNOSTIC AND THERAPEUTIC PARACENTESIS MEDICATIONS: None. COMPLICATIONS: None immediate. PROCEDURE: Procedure, benefits, and risks of procedure were discussed with patient. Written informed consent for procedure was obtained. Time out protocol followed. Adequate collection of ascites localized by ultrasound in LEFT lower quadrant. Skin prepped and draped in usual sterile fashion. Skin and soft tissues anesthetized with 10 mL of 1% lidocaine. 5 [place] Yueh catheter placed into peritoneal cavity. 4 L of amber colored ascitic fluid aspirated by vacuum bottle suction. Procedure tolerated well by patient without immediate complication. Approximately 1 hr following the procedure, patient developed itching. Physical exam shows truncal hives. Vital signs stable, no respiratory symptoms per patient. Uncertain if patient is  having a reaction to the chlorhexidine scrub, the 1% lidocaine local anesthesia, the IV albumin given at the time of the procedure, or some other cause. Patient treated with 50 mg Benadryl IV. FINDINGS: A total of approximately 4 L of ascitic fluid was removed. Samples were sent to the laboratory as requested by the clinical team. IMPRESSION: Successful ultrasound-guided paracentesis yielding 4 liters of peritoneal fluid. Itching and hives following procedure as above. Electronically Signed   By: Lavonia Dana M.D.   On: 01/20/2017  15:34        Scheduled Meds: . calcitRIOL  0.5 mcg Oral Daily  . diphenhydrAMINE      . [START ON 01/21/2017] furosemide  160 mg Oral BID  . insulin aspart  0-15 Units Subcutaneous TID WC  . mouth rinse  15 mL Mouth Rinse BID  . propranolol  20 mg Oral Q6H   Continuous Infusions: . diltiazem (CARDIZEM) infusion 5 mg/hr (01/20/17 1300)     LOS: 1 day    Time spent: 30 minutes    Loretha Stapler, MD [facility] Pager [phone]  If 7PM-7AM, please contact night-coverage [email or web address] Password [email or web address] 01/20/2017, 3:50 PM

## 2017-01-20 NOTE — Progress Notes (Signed)
Discussed with Dr. Oneida Alar. Will allow low sodium, heart healthy diet today. NPO after midnight in preparation for hopeful EGD tomorrow for screening purposes. Coumadin is on hold and will assess INR tomorrow. Will order echo for today to assess for further assessment. Could have congestive hepatopathy. Continue with plans for paracentesis today.

## 2017-01-20 NOTE — Procedures (Signed)
PreOperative Dx: Ascites Postoperative Dx: Ascites Procedure:   US guided paracentesis Radiologist:  Thornton Papas Anesthesia:  10 ml of1% lidocaine Specimen:  4 L of amber colored ascitic fluid EBL:   < 1 ml Complications: None immediate; patient developed itching 1 hour following the procedure.  Exam demonstrates truncal hives.  VSS, no respiratory symptoms per patient.  Treated with 50 mg Benadryl IV.  Uncertain if related to IV albumin, 1% lidocaine, chlorhexidine scrub, or another cause.

## 2017-01-20 NOTE — Progress Notes (Addendum)
Inpatient Diabetes Program Recommendations  AACE/ADA: New Consensus Statement on Inpatient Glycemic Control (2015)  Target Ranges:  Prepandial:   less than 140 mg/dL      Peak postprandial:   less than 180 mg/dL (1-2 hours)      Critically ill patients:  140 - 180 mg/dL   Results for Melvin Wang, Melvin Wang (MRN [id number]) as of 01/20/2017 08:20  Ref. Range 01/19/2017 21:42 01/20/2017 08:01  Glucose-Capillary Latest Ref Range: 65 - 99 mg/dL 70 37 (LL)  Results for Melvin Wang, Melvin Wang (MRN [id number]) as of 01/20/2017 08:20  Ref. Range 01/13/2017 09:34  Glucose Latest Ref Range: 65 - 99 mg/dL 75   Review of Glycemic Control  Diabetes history: DM2 Outpatient Diabetes medications: Lantus 40 units QAM Current orders for Inpatient glycemic control: Lantus 40 units daily, Novolog 0-15 units TID with meals  Inpatient Diabetes Program Recommendations: Insulin - Basal: Fasting glucose 37 mg/dl this morning. Per chart review, noted on home medication list patient reported taking Lantus 40 units on 01/19/17. Please discontinue Lantus and if glucose becomes consistently greater than 180 mg/dl, please reorder Lantus at 20 units Q24H.  Correction (SSI): Please consider ordering Novolog 0-5 units QHS for bedtime correction.  Addendum 01/20/17@12 :30-Talked with patient and his wife regarding DM control and outpatient DM medications. Patient reports that he checks his glucose 1-2 times per day and it is usually from mid 70's to mid 100's mg/dl most of the time. Patient denies having issues with hypoglycemia (reports 2 lows since being on insulin). Patient states that he has continued taking Lantus 40 units QAM despite not being able to eat over the past few days. Inquired about ability to recognize glucose of 37 mg/dl this morning and patient states he knew right away when he woke up that something was wrong because of the way he felt. Patient states that he keeps glucose tablets with him all the time in case he has any  issues with hypoglycemia but again denies any issues with hypoglycemia.  Patient reports that he has been on the same dose of Lantus for at least 2 years and glucose is well controlled usually. Encouraged patient to continue monitoring glucose and to make sure he contacts his PCP if he experiences any issues with hypoglycemia at home. Patient verbalized understanding of information discussed and states that he has no further questions related to DM at this time. Will continue to follow while inpatient.  Thanks, Barnie Alderman, RN, MSN, CDE Diabetes Coordinator Inpatient Diabetes Program [phone] (Team Pager from 8am to 5pm)

## 2017-01-20 NOTE — Consult Note (Signed)
Referring Provider: Dr. Lorin Mercy  Primary Care Physician:  Claretta Fraise, MD Primary Gastroenterologist:  Dr. Oneida Alar   Date of Admission: 01/19/17 Date of Consultation: 01/20/17  Reason for Consultation:  Ascites   HPI:  Melvin Wang is a very pleasant  76 y.o. year old male, originally from [place] but moved here to the [place] ago with his family, presenting with worsening dyspnea, abdominal distension and swelling, difficulty sleeping, prompting ED evaluation. History of afib and on Coumadin. Started on Cardizem drip due to increased heart rate. He actually presented to the ED on 01/13/17 with abdominal distension and underwent CT abd/pelvis with contrast that noted cirrhosis, moderate to large volume ascites, increasing since prior study. Apparently, he had a CT done with [facility] 11/14/16 that (per notes in epic H&P) showed cirrhosis. Patient states he was unaware of this.   Notes he has intentionally lost a total of 100 lbs over the past 3 years through diet and exercise. He has been quite active but notes he had a difficult Thanksgiving and Christmas, attributing his bad eating habits to abdominal swelling. States last weekend, his dyspnea was worsening and this is why he presented to the ED. He was actually to see our office as new patient tomorrow to establish care. Notes only mild edema in legs. Denies abdominal pain, only discomfort. No fever, chills. No N/V. No overt GI bleeding. Has never been told he had liver disease. No prior colonoscopy/endoscopy. Father diagnosed with colon cancer later in life at 74, deceased 80. Alk phos 476 in 2023-02-09, Tbili 1.7. Alk Phos has remained elevated, 241 last week and on admission was similar at 251. Transaminases remain normal. Chronic anemia, with baseline Hgb in the 10-12 range. Appears his alk phos has been chronically mildly elevated for several years with normal transaminases. INR elevated on admission in setting of Coumadin (3.13).      Past Medical History:  Diagnosis Date  . Abnormality of gait   . Cataract   . Chronic kidney disease    renal insufficiency, sees nephrologist: [facility]  . Diabetes mellitus without complication ([place])   . Dysrhythmia    AFib  . Foot ulcer, right ([place]) 03/2013  . Gout   . Hyperlipidemia   . Hypertension   . Movement disorder    essential tremor    Past Surgical History:  Procedure Laterality Date  . CATARACT EXTRACTION W/PHACO Left 04/19/2013   Procedure: CATARACT EXTRACTION PHACO AND INTRAOCULAR LENS PLACEMENT (IOC);  Surgeon: Tonny Branch, MD;  Location: [facility];  Service: Ophthalmology;  Laterality: Left;  CDE 29.30  . CATARACT EXTRACTION W/PHACO Right 07/15/2013   Procedure: CATARACT EXTRACTION PHACO AND INTRAOCULAR LENS PLACEMENT (IOC);  Surgeon: Tonny Branch, MD;  Location: [facility];  Service: Ophthalmology;  Laterality: Right;  CDE:12.84    Prior to Admission medications   Medication Sig Start Date End Date Taking? Authorizing Provider  atenolol (TENORMIN) 50 MG tablet TAKE 1 TABLET (50 MG TOTAL) BY MOUTH DAILY. 11/28/16  Yes Claretta Fraise, MD  atorvastatin (LIPITOR) 40 MG tablet TAKE 1 TABLET BY MOUTH AT BEDTIME 10/08/16  Yes Claretta Fraise, MD  calcitRIOL (ROCALTROL) 0.5 MCG capsule daily   Yes Lysbeth Penner, FNP  furosemide (LASIX) 80 MG tablet TAKE 2 TABLETS (160 MG TOTAL) BY MOUTH 2 (TWO) TIMES DAILY. 11/04/16  Yes Claretta Fraise, MD  LANTUS SOLOSTAR 100 UNIT/ML Solostar Pen INJECT 40 UNITS INTO THE SKIN EVERY MORNING. 09/25/16  Yes Claretta Fraise, MD  [place]  TEST test strip USE TO CHECK BG UP TO 4 TIMES A DAY 05/27/16  Yes Claretta Fraise, MD  warfarin (COUMADIN) 10 MG tablet Take as directed by anticoagulation dose - 1/2 to 1 tablet daily-Take no Warfarin on Monday or Friday 05/05/15  Yes Cherre Robins, PharmD  B-D UF III MINI PEN NEEDLES 31G X 5 MM MISC USE 1 TIME DAILY WITH LANTUS SOLOSTAR 06/05/16   Claretta Fraise, MD    Current Facility-Administered  Medications  Medication Dose Route Frequency Provider Last Rate Last Dose  . acetaminophen (TYLENOL) tablet 650 mg  650 mg Oral Q4H PRN Karmen Bongo, MD      . calcitRIOL (ROCALTROL) capsule 0.5 mcg  0.5 mcg Oral Daily Karmen Bongo, MD   0.5 mcg at 01/19/17 2206  . diltiazem (CARDIZEM) 100 mg in dextrose 5% 143m (1 mg/mL) infusion  5-15 mg/hr Intravenous Titrated JKarmen Bongo MD 5 mL/hr at 01/20/17 0600 5 mg/hr at 01/20/17 0600  . furosemide (LASIX) tablet 160 mg  160 mg Oral BID JKarmen Bongo MD      . insulin aspart (novoLOG) injection 0-15 Units  0-15 Units Subcutaneous TID WC JKarmen Bongo MD      . insulin glargine (LANTUS) injection 40 Units  40 Units Subcutaneous Daily JKarmen Bongo MD      . MEDLINE mouth rinse  15 mL Mouth Rinse BID JKarmen Bongo MD   15 mL at 01/19/17 2200  . ondansetron (ZOFRAN) injection 4 mg  4 mg Intravenous Q6H PRN JKarmen Bongo MD        Allergies as of 01/19/2017 - Review Complete 01/19/2017  Allergen Reaction Noted  . Acyclovir and related Hives 03/18/2013  . Metformin and related Diarrhea 03/13/2015  . Amoxicillin Hives 03/18/2013  . Augmentin [amoxicillin-pot clavulanate] Hives 03/18/2013  . Mavik [trandolapril] Other (See Comments) 04/13/2013    Family History  Problem Relation Age of Onset  . Cancer Father     rectal  . Cancer Paternal Uncle     Social History   Social History  . Marital status: Married    Spouse name: N/A  . Number of children: N/A  . Years of education: N/A   Occupational History  . retired [employer]   Social History Main Topics  . Smoking status: Former Smoker    Packs/day: 1.00    Years: 15.00    Types: Cigarettes    Quit date: 07/07/1984  . Smokeless tobacco: Never Used  . Alcohol use No  . Drug use: No  . Sexual activity: Yes    Birth control/ protection: None   Other Topics Concern  . Not on file   Social History Narrative  . No narrative on file    Review of Systems: Gen: see  HPI  CV: Denies chest pain Resp: see HPI  GI: see HPI  GU : Denies urinary burning, urinary frequency, urinary incontinence.  MS: see HPI  Derm: Denies rash, itching, dry skin Psych: Denies depression, anxiety,confusion, or memory loss Heme: Denies bruising, bleeding, and enlarged lymph nodes.  Physical Exam: Vital signs in last 24 hours: Temp:  [97.4 F (36.3 C)-98 F (36.7 C)] 98 F (36.7 C) (02/12 0400) Pulse Rate:  [44-168] 85 (02/12 0630) Resp:  [15-32] 21 (02/12 0630) BP: (85-124)/(51-86) 97/60 (02/12 0630) SpO2:  [87 %-99 %] 94 % (02/12 0630) Weight:  [270 lb (122.5 kg)-274 lb 0.5 oz (124.3 kg)] 274 lb 0.5 oz (124.3 kg) (02/12 0500) Last BM Date: 01/19/17 General:   Alert,  Well-developed, pleasant, sallow-appearing  Head:  Normocephalic and atraumatic. Eyes:  Sclera clear, no icterus.    Ears:  Normal auditory acuity. Mouth:  No deformity or lesions Lungs:  Clear throughout to auscultation. Diminished bases, scattered rhonchi Heart:  Irregularly irregular, in 80s-90s Abdomen:  Tense ascites, +BS, no TTP, rebound, or guarding Rectal:  Deferred until time of colonoscopy.   Msk:  Symmetrical without gross deformities. Normal posture. Extremities:  With mild bilateral lower extremity edema, pedal edema  Neurologic:  Alert and  oriented x4;  grossly normal neurologically. Psych:  Alert and cooperative. Normal mood and affect.  Intake/Output from previous day: 02/11 0701 - 02/12 0700 In: 1405 [P.O.:600; I.V.:55; IV Piggyback:750] Out: 1100 [Urine:1100] Intake/Output this shift: No intake/output data recorded.  Lab Results:  Recent Labs  01/19/17 1545 01/20/17 0420  WBC 9.5 9.3  HGB 11.5* 10.9*  HCT 34.2* 32.2*  PLT 142* 138*   BMET  Recent Labs  01/19/17 1545 01/20/17 0420  NA 135 135  K 4.1 3.7  CL 94* 96*  CO2 32 30  GLUCOSE 88 48*  BUN 50* 48*  CREATININE 1.12 1.06  CALCIUM 8.9 8.8*   LFT  Recent Labs  01/19/17 1545  PROT 7.9  ALBUMIN  3.4*  AST 35  ALT 21  ALKPHOS 251*  BILITOT 1.9*   PT/INR  Recent Labs  01/19/17 1545 01/20/17 0420  LABPROT 32.9* 31.6*  INR 3.13 2.98    Studies/Results: Dg Chest 2 View  Result Date: 01/19/2017 CLINICAL DATA:  Shortness of breath, dyspnea with exertion EXAM: CHEST  2 VIEW COMPARISON:  None. FINDINGS: Patchy left lower lobe opacity, suspicious for pneumonia, less likely atelectasis. Right lung is clear. No frank interstitial edema. Possible small left pleural effusion. No pneumothorax. Mild cardiomegaly. IMPRESSION: Patchy left lower lobe opacity, suspicious for pneumonia, less likely atelectasis. Possible small left pleural effusion. Electronically Signed   By: Julian Hy M.D.   On: 01/19/2017 16:32   CT 01/13/17 IMPRESSION: Findings suspicious for cirrhosis. Moderate to large volume ascites, increasing since prior study.Diffuse aortic atherosclerosis and coronary artery disease. Cardiomegaly.   Impression: 76 year old very pleasant male presenting with decompensated cirrhosis with tense ascites, denying any known prior history of liver disease. Upon review of epic, he has had isolated elevated alkaline phosphatase for several years, with 2-3 X upper limits of normal noted on most recent blood draws. Bilirubin mildly elevated. Transaminases remain normal. 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 CT abd/pelvis with contrast a week ago without evidence of biliary ductal dilation and findings of cirrhosis. He has had no overt GI bleeding, denies confusion, and low concern for SBP at this point. Will order extensive serologies due to new finding of cirrhosis, agree with paracentesis and have ordered fluid analysis and albumin to be given at time of paracentesis, will need outpatient screening colonoscopy (FH of colon cancer) and endoscopy for variceal screening. Discussed cirrhosis care with patient and supportive measures at this point. Unable to  accurately calculate MELD with elevated INR in setting of chronic Coumadin.   Anemia: no overt GI bleeding, chronic, extensive labs ordered. Likely multifactorial.   Plan: May have clear liquids Paracentesis today with fluid analysis (first ever paracentesis) Albumin at time of paracentesis ordered Extensive serologies ordered Outpatient follow-up for cirrhosis care to arrange endoscopy and colonoscopy for screening purposes Will continue to follow with you  Annitta Needs, ANP-BC [facility]     LOS: 1  day    01/20/2017, 8:02 AM

## 2017-01-20 NOTE — Progress Notes (Signed)
*  PRELIMINARY RESULTS* Echocardiogram 2D Echocardiogram has been performed with [facility].  Samuel Germany 01/20/2017, 4:28 PM

## 2017-01-20 NOTE — Plan of Care (Signed)
Problem: Skin Integrity: Goal: Risk for impaired skin integrity will decrease Outcome: Not Progressing PT HAS VENUS STASIS ULCER ON LT ANTERIOR SHIN THAT ISDRY W/ YELLOW SURFACE. BIL LOWER LEGS ARE COOL TO TOUCH AND PURPLE IN COLOR  Problem: Activity: Goal: Risk for activity intolerance will decrease Outcome: Not Progressing PT IS UNABLE TO AMBULATE FROM BED TO TOLIET W/O ASSIST X 2  Problem: Nutrition: Goal: Adequate nutrition will be maintained Outcome: Not Progressing POOR PO INTAKE TOLERANCE D/T ASCITES

## 2017-01-21 ENCOUNTER — Encounter (HOSPITAL_COMMUNITY)
Admission: EM | Disposition: A | Discharge status: Home / Self Care | Payer: Self-pay | Source: Home / Self Care | Attending: Family Medicine

## 2017-01-21 ENCOUNTER — Encounter (HOSPITAL_COMMUNITY): Payer: Self-pay | Admitting: Physician Assistant

## 2017-01-21 ENCOUNTER — Telehealth: Payer: Self-pay | Admitting: Gastroenterology

## 2017-01-21 ENCOUNTER — Ambulatory Visit: Payer: Medicare Other | Admitting: Gastroenterology

## 2017-01-21 ENCOUNTER — Encounter: Payer: Self-pay | Admitting: Gastroenterology

## 2017-01-21 DIAGNOSIS — D696 Thrombocytopenia, unspecified: Secondary | ICD-10-CM

## 2017-01-21 DIAGNOSIS — E8770 Fluid overload, unspecified: Secondary | ICD-10-CM

## 2017-01-21 DIAGNOSIS — G250 Essential tremor: Secondary | ICD-10-CM

## 2017-01-21 DIAGNOSIS — I270 Primary pulmonary hypertension: Secondary | ICD-10-CM

## 2017-01-21 DIAGNOSIS — J9600 Acute respiratory failure, unspecified whether with hypoxia or hypercapnia: Secondary | ICD-10-CM

## 2017-01-21 DIAGNOSIS — K317 Polyp of stomach and duodenum: Secondary | ICD-10-CM

## 2017-01-21 DIAGNOSIS — I517 Cardiomegaly: Secondary | ICD-10-CM

## 2017-01-21 DIAGNOSIS — N182 Chronic kidney disease, stage 2 (mild): Secondary | ICD-10-CM

## 2017-01-21 DIAGNOSIS — E784 Other hyperlipidemia: Secondary | ICD-10-CM

## 2017-01-21 DIAGNOSIS — E1121 Type 2 diabetes mellitus with diabetic nephropathy: Secondary | ICD-10-CM

## 2017-01-21 DIAGNOSIS — R188 Other ascites: Secondary | ICD-10-CM

## 2017-01-21 DIAGNOSIS — K2980 Duodenitis without bleeding: Secondary | ICD-10-CM

## 2017-01-21 DIAGNOSIS — Z794 Long term (current) use of insulin: Secondary | ICD-10-CM

## 2017-01-21 DIAGNOSIS — I482 Chronic atrial fibrillation: Secondary | ICD-10-CM

## 2017-01-21 DIAGNOSIS — I50811 Acute right heart failure: Secondary | ICD-10-CM

## 2017-01-21 DIAGNOSIS — K2970 Gastritis, unspecified, without bleeding: Secondary | ICD-10-CM

## 2017-01-21 HISTORY — PX: ESOPHAGOGASTRODUODENOSCOPY: SHX5428

## 2017-01-21 LAB — ECHOCARDIOGRAM COMPLETE
AV Mean grad: 3 mmHg
AV Peak grad: 6 mmHg
AV area mean vel ind: 0.84 cm2/m2
AV pk vel: 118 cm/s
AV vel: 2.32
AVAREAMEANV: 2.16 cm2
AVAREAVTI: 2.32 cm2
AVAREAVTIIND: 0.9 cm2/m2
AVCELMEANRAT: 0.69
Ao pk vel: 0.74 m/s
CHL CUP AV PEAK INDEX: 0.9
CHL CUP DOP CALC LVOT VTI: 18 cm
CHL CUP MV DEC (S): 187
DOP CAL AO MEAN VELOCITY: 85 cm/s
EWDT: 187 ms
FS: 28 % (ref 28–44)
Height: 73 in
IVS/LV PW RATIO, ED: 0.98
LA ID, A-P, ES: 48 mm
LA diam index: 1.87 cm/m2
LA vol index: 50.1 mL/m2
LAVOL: 129 mL
LAVOLA4C: 129 mL
LDCA: 3.14 cm2
LEFT ATRIUM END SYS DIAM: 48 mm
LV PW d: 11.6 mm — AB (ref 0.6–1.1)
LV dias vol index: 33 mL/m2
LV dias vol: 85 mL (ref 62–150)
LV sys vol index: 16 mL/m2
LVOT SV: 57 mL
LVOT peak VTI: 0.74 cm
LVOT peak grad rest: 3 mmHg
LVOTD: 20 mm
LVOTPV: 87.2 cm/s
LVSYSVOL: 40 mL (ref 21–61)
Lateral S' vel: 8.7 cm/s
MV VTI: 132 cm
MVPG: 5 mmHg
MVPKEVEL: 110 m/s
RV sys press: 21 mmHg
Reg peak vel: 125 cm/s
Simpson's disk: 53
Stroke v: 45 ml
TAPSE: 17.5 mm
TR max vel: 125 cm/s
VTI: 24.4 cm
Valve area index: 0.9
Valve area: 2.32 cm2
WEIGHTICAEL: 4384.51 [oz_av]

## 2017-01-21 LAB — BASIC METABOLIC PANEL
Anion gap: 8 (ref 5–15)
BUN: 46 mg/dL — ABNORMAL HIGH (ref 6–20)
CO2: 33 mmol/L — ABNORMAL HIGH (ref 22–32)
Calcium: 8.7 mg/dL — ABNORMAL LOW (ref 8.9–10.3)
Chloride: 96 mmol/L — ABNORMAL LOW (ref 101–111)
Creatinine, Ser: 1.16 mg/dL (ref 0.61–1.24)
GFR calc non Af Amer: >60 mL/min (ref >60)
Glucose, Bld: 92 mg/dL (ref 65–99)
POTASSIUM: 3.8 mmol/L (ref 3.5–5.1)
SODIUM: 137 mmol/L (ref 135–145)

## 2017-01-21 LAB — CERULOPLASMIN: CERULOPLASMIN: 43.8 mg/dL — AB (ref 16.0–31.0)

## 2017-01-21 LAB — HEPATIC FUNCTION PANEL
ALBUMIN: 3.3 g/dL — AB (ref 3.5–5.0)
ALT: 20 U/L (ref 17–63)
AST: 32 U/L (ref 15–41)
Alkaline Phosphatase: 230 U/L — ABNORMAL HIGH (ref 38–126)
BILIRUBIN DIRECT: 0.7 mg/dL — AB (ref 0.1–0.5)
BILIRUBIN INDIRECT: 1.4 mg/dL — AB (ref 0.3–0.9)
BILIRUBIN TOTAL: 2.1 mg/dL — AB (ref 0.3–1.2)
Total Protein: 7.5 g/dL (ref 6.5–8.1)

## 2017-01-21 LAB — GAMMA GT: GGT: 56 U/L — AB (ref 7–50)

## 2017-01-21 LAB — PROTIME-INR
INR: 2.08
Prothrombin Time: 23.8 seconds — ABNORMAL HIGH (ref 11.4–15.2)

## 2017-01-21 LAB — GLUCOSE, CAPILLARY
GLUCOSE-CAPILLARY: 75 mg/dL (ref 65–99)
GLUCOSE-CAPILLARY: 72 mg/dL (ref 65–99)
Glucose-Capillary: 92 mg/dL (ref 65–99)
Glucose-Capillary: 111 mg/dL — ABNORMAL HIGH (ref 65–99)

## 2017-01-21 LAB — ANTINUCLEAR ANTIBODIES, IFA: ANA Ab, IFA: NEGATIVE

## 2017-01-21 LAB — ALPHA-1-ANTITRYPSIN: A-1 Antitrypsin, Ser: 216 mg/dL — ABNORMAL HIGH (ref 90–200)

## 2017-01-21 LAB — PROTEIN, BODY FLUID: TOTAL PROTEIN, FLUID: 3.2 g/dL

## 2017-01-21 LAB — ANTI-SMOOTH MUSCLE ANTIBODY, IGG: F-Actin IgG: 12 Units (ref 0–19)

## 2017-01-21 LAB — HEPATITIS B SURFACE ANTIGEN: Hepatitis B Surface Ag: NEGATIVE

## 2017-01-21 LAB — HEPATITIS C ANTIBODY: HCV Ab: <0.1 s/co ratio (ref 0.0–0.9)

## 2017-01-21 LAB — MITOCHONDRIAL ANTIBODIES: MITOCHONDRIAL M2 AB, IGG: 5.5 U (ref 0.0–20.0)

## 2017-01-21 LAB — HEMOGLOBIN A1C
HEMOGLOBIN A1C: 5.4 % (ref 4.8–5.6)
MEAN PLASMA GLUCOSE: 108 mg/dL

## 2017-01-21 LAB — PROTEIN, TOTAL

## 2017-01-21 SURGERY — EGD (ESOPHAGOGASTRODUODENOSCOPY)
Anesthesia: Moderate Sedation

## 2017-01-21 MED ORDER — LIDOCAINE VISCOUS 2 % MT SOLN
OROMUCOSAL | Status: AC
  Filled 2017-01-21: qty 15

## 2017-01-21 MED ORDER — PROPRANOLOL HCL 20 MG PO TABS
40.0000 mg | ORAL_TABLET | Freq: Two times a day (BID) | ORAL | Status: DC
  Filled 2017-01-21: qty 1

## 2017-01-21 MED ORDER — TORSEMIDE 20 MG PO TABS: 60.0000 mg | ORAL_TABLET | Freq: Two times a day (BID) | ORAL | 0 refills | Status: DC

## 2017-01-21 MED ORDER — SPIRONOLACTONE 100 MG PO TABS: 100.0000 mg | ORAL_TABLET | Freq: Every day | ORAL | 0 refills | Status: DC

## 2017-01-21 MED ORDER — MEPERIDINE HCL 100 MG/ML IJ SOLN
INTRAMUSCULAR | Status: DC | PRN
  Administered 2017-01-21: 25 mg via INTRAVENOUS

## 2017-01-21 MED ORDER — TORSEMIDE 20 MG PO TABS
60.0000 mg | ORAL_TABLET | Freq: Two times a day (BID) | ORAL | Status: DC
  Filled 2017-01-21: qty 3

## 2017-01-21 MED ORDER — DEXTROSE-NACL 5-0.9 % IV SOLN
INTRAVENOUS | Status: DC
  Administered 2017-01-21: 11:00:00 via INTRAVENOUS

## 2017-01-21 MED ORDER — MEPERIDINE HCL 100 MG/ML IJ SOLN
INTRAMUSCULAR | Status: AC
  Filled 2017-01-21: qty 2

## 2017-01-21 MED ORDER — PANTOPRAZOLE SODIUM 40 MG PO TBEC: 40.0000 mg | DELAYED_RELEASE_TABLET | Freq: Two times a day (BID) | ORAL | 0 refills | Status: DC

## 2017-01-21 MED ORDER — PANTOPRAZOLE SODIUM 40 MG PO TBEC
40.0000 mg | DELAYED_RELEASE_TABLET | Freq: Two times a day (BID) | ORAL | Status: DC
  Administered 2017-01-21: 40 mg via ORAL
  Filled 2017-01-21: qty 1

## 2017-01-21 MED ORDER — MIDAZOLAM HCL 5 MG/5ML IJ SOLN
INTRAMUSCULAR | Status: DC | PRN
  Administered 2017-01-21: 1 mg via INTRAVENOUS
  Administered 2017-01-21: 2 mg via INTRAVENOUS

## 2017-01-21 MED ORDER — MIDAZOLAM HCL 5 MG/5ML IJ SOLN
INTRAMUSCULAR | Status: AC
  Filled 2017-01-21: qty 10

## 2017-01-21 MED ORDER — SODIUM CHLORIDE 0.9 % IV SOLN
INTRAVENOUS | Status: DC
  Administered 2017-01-21: 11:00:00 via INTRAVENOUS

## 2017-01-21 MED ORDER — PROPRANOLOL HCL 40 MG PO TABS: 40.0000 mg | ORAL_TABLET | Freq: Two times a day (BID) | ORAL | 0 refills | Status: DC

## 2017-01-21 NOTE — Consult Note (Signed)
Cardiology Consultation Note    Patient ID: Melvin Wang, MRN: [id number], DOB/AGE: 07/23/1941 76 y.o. Admit date: 01/19/2017   Date of Consult: 01/21/2017 Primary Physician: Claretta Fraise, MD Primary Cardiologist: New to Dr. Harl Bowie  Chief Complaint: abdominal distention Reason for Consultation: right heart failure findings on echo -> new cirrhosis Requesting MD: Roseanne Kaufman NP  HPI: Melvin Wang is a 76 y.o. male with history of DM, HTN, HLD, CKD II, essential tremor, and permanent atrial fib whom we are asked to see for abnormal echo. [place]+ years ago to give his children a better life. He reports a longstanding history of atrial fib >20 years with prior unsuccessful DCCVs. He has not been followed by cardiology. More recently he has been followed by urology for hematuria with reported unremarkable workup except noted indicate a CT scan showed mild cirrhosis which the patient was unaware of. He exercises every morning at [facility] exercise facility and had been doing so up until a week ago without any functional limitation. Minimal lifetime history of alcohol, only 2 years from [phone]. +20 years former tobacco use. On 01/13/17 he presented to the ER with abdominal distention and rapid atrial fib. He was treated with IV diltiazem with improvement. He was felt stable for OP GI workup of cirrhosis. However over the next several days the patient began to notice worsening abdominal distention, weight gain, SOB and inability to cough due to his abdominal distention. He presented back to the ED 01/19/17 and was admitted for evaluation of ascites as well as rapid atrial fib. He was started on cardizem trip and also treated for suspected CAP based on CXR. He was seen by GI who suspects burned out NASH. He was started on high dose diuretics. He underwent paracentesis yielding 4L amber colored fluid. He was transitioned off atenolol/cardizem and onto propranolol  for portal HTN and tremor. Statin was held (LDL 33). 2D Echo 01/20/17 showed mild LVH, EF 55-60%, hypokinesis of basal mid anteroseptal myocardium, mild MR, severe LAE/RAE, severe RVE with low-normal systolic function, mild-mod TR, large left pleural effusion, elevated CVP, mild PR, unable to estimate PASP. With the above measures the patient feels significantly improved. He still has abdominal distention but his breathing is overall improved. He's since been started on spironolactone. HR in the 80s-100 range.  He has history of LEE x 10 years and had not noticed any significant change there - no significant edema on exam today. Most recent labs this adm notable for TBili 2.1, albumin 3.3, alk phos 230, BUN 46, Cr 1.16, neg HCV/hep B, Hgb 10.9, MCV 101, plt 138. INR has been therapeutic in recent months per review.   Interestingly he does note that over the last few years he's required lower doses of Coumadin to achieve goal INR. He is pending EGD today to evaluate varices then strongly wants to go home.  Past Medical History:  Diagnosis Date  . Abnormality of gait   . Cataract   . CKD (chronic kidney disease), stage III    renal insufficiency, sees nephrologist: [facility]  . Diabetes mellitus ([place])   . Essential tremor   . Foot ulcer, right ([place]) 03/2013  . Gout   . Hyperlipidemia   . Hypertension   . Permanent atrial fibrillation (HCC)    a. >20 years, prior DCCVs unsuccessful, INR followed by PCP.      Surgical History:  Past Surgical History:  Procedure Laterality Date  .  CATARACT EXTRACTION W/PHACO Left 04/19/2013   Procedure: CATARACT EXTRACTION PHACO AND INTRAOCULAR LENS PLACEMENT (IOC);  Surgeon: Tonny , MD;  Location: [facility];  Service: Ophthalmology;  Laterality: Left;  CDE 29.30  . CATARACT EXTRACTION W/PHACO Right 07/15/2013   Procedure: CATARACT EXTRACTION PHACO AND INTRAOCULAR LENS PLACEMENT (IOC);  Surgeon: Tonny , MD;  Location: [facility];  Service: Ophthalmology;   Laterality: Right;  CDE:12.84     Home Meds: Prior to Admission medications   Medication Sig Start Date End Date Taking? Authorizing Provider  atenolol (TENORMIN) 50 MG tablet TAKE 1 TABLET (50 MG TOTAL) BY MOUTH DAILY. 11/28/16  Yes Claretta Fraise, MD  atorvastatin (LIPITOR) 40 MG tablet TAKE 1 TABLET BY MOUTH AT BEDTIME 10/08/16  Yes Claretta Fraise, MD  calcitRIOL (ROCALTROL) 0.5 MCG capsule daily   Yes Lysbeth Penner, FNP  furosemide (LASIX) 80 MG tablet TAKE 2 TABLETS (160 MG TOTAL) BY MOUTH 2 (TWO) TIMES DAILY. 11/04/16  Yes Claretta Fraise, MD  LANTUS SOLOSTAR 100 UNIT/ML Solostar Pen INJECT 40 UNITS INTO THE SKIN EVERY MORNING. 09/25/16  Yes Claretta Fraise, MD  ONE TOUCH ULTRA TEST test strip USE TO CHECK BG UP TO 4 TIMES A DAY 05/27/16  Yes Claretta Fraise, MD  warfarin (COUMADIN) 10 MG tablet Take 5 mg by mouth See admin instructions. Take as directed by anticoagulation dose - 1/2 tablet daily except take no Warfarin on Monday or Friday 05/05/15  Yes Cherre Robins, PharmD  B-D UF III MINI PEN NEEDLES 31G X 5 MM MISC USE 1 TIME DAILY WITH LANTUS SOLOSTAR 06/05/16   Claretta Fraise, MD    Inpatient Medications:  . [MAR Hold] calcitRIOL  0.5 mcg Oral Daily  . [MAR Hold] insulin aspart  0-15 Units Subcutaneous TID WC  . [MAR Hold] mouth rinse  15 mL Mouth Rinse BID  . propranolol  40 mg Oral BID  . [MAR Hold] spironolactone  100 mg Oral Q breakfast  . torsemide  60 mg Oral BID   . sodium chloride      Allergies:  Allergies  Allergen Reactions  . Acyclovir And Related Hives  . Metformin And Related Diarrhea  . Amoxicillin Hives  . Augmentin [Amoxicillin-Pot Clavulanate] Hives  . Mavik [Trandolapril] Other (See Comments)    Caused renal problems    Social History   Social History  . Marital status: Married    Spouse name: N/A  . Number of children: N/A  . Years of education: N/A   Occupational History  . retired Dealer    Social History Main Topics  . Smoking status:  Former Smoker    Packs/day: 1.00    Years: 15.00    Types: Cigarettes    Quit date: 07/07/1984  . Smokeless tobacco: Never Used     Comment: Smoked 20 years  . Alcohol use No     Comment: HISTORY OF HEAVY ETOH USE in 1966-1968, otherwise none   . Drug use: No  . Sexual activity: Yes    Birth control/ protection: None   Other Topics Concern  . Not on file   Social History Narrative  . No narrative on file     Family History  Problem Relation Age of Onset  . Colon cancer Father 38    Deceased age 76   . Cancer Paternal Uncle   . Liver disease Neg Hx      Review of Systems:no syncope, chest pain, palpitations All other systems reviewed and are otherwise negative except as noted  above.  Labs: No results for input(s): CKTOTAL, CKMB, TROPONINI in the last 72 hours. Lab Results  Component Value Date   WBC 9.3 01/20/2017   HGB 10.9 (L) 01/20/2017   HCT 32.2 (L) 01/20/2017   MCV 101.3 (H) 01/20/2017   PLT 138 (L) 01/20/2017     Recent Labs Lab 01/20/17 1155 01/21/17 0428  NA  --  137  K  --  3.8  CL  --  96*  CO2  --  33*  BUN  --  46*  CREATININE  --  1.16  CALCIUM  --  8.7*  PROT 7.5  --   BILITOT 2.1*  --   ALKPHOS 230*  --   ALT 20  --   AST 32  --   GLUCOSE  --  92   Lab Results  Component Value Date   CHOL 77 (L) 09/23/2016   HDL 32 (L) 09/23/2016   LDLCALC 33 09/23/2016   TRIG 62 09/23/2016   No results found for: DDIMER  Radiology/Studies:  Dg Chest 2 View  Result Date: 01/19/2017 CLINICAL DATA:  Shortness of breath, dyspnea with exertion EXAM: CHEST  2 VIEW COMPARISON:  None. FINDINGS: Patchy left lower lobe opacity, suspicious for pneumonia, less likely atelectasis. Right lung is clear. No frank interstitial edema. Possible small left pleural effusion. No pneumothorax. Mild cardiomegaly. IMPRESSION: Patchy left lower lobe opacity, suspicious for pneumonia, less likely atelectasis. Possible small left pleural effusion. Electronically Signed   By:  Julian Hy M.D.   On: 01/19/2017 16:32   Ct Abdomen Pelvis W Contrast  Result Date: 01/13/2017 CLINICAL DATA:  Abdominal distension. EXAM: CT ABDOMEN AND PELVIS WITH CONTRAST TECHNIQUE: Multidetector CT imaging of the abdomen and pelvis was performed using the standard protocol following bolus administration of intravenous contrast. CONTRAST:  131m ISOVUE-300 IOPAMIDOL (ISOVUE-300) INJECTION 61% COMPARISON:  11/14/2016 FINDINGS: Lower chest: Mild cardiomegaly. Diffuse coronary artery calcifications in the visualized right coronary artery. Bibasilar atelectasis. No effusions. Hepatobiliary: Mild enlargement of the left hepatic lobe and subtle nodularity to the liver surface suggests cirrhosis. No visible focal abnormality or biliary ductal dilatation. No visible gallstones. Pancreas: No focal abnormality or ductal dilatation. Spleen: No focal abnormality.  Normal size. Adrenals/Urinary Tract: Small cyst in the midpole of the right kidney, otherwise unremarkable kidneys. Adrenal glands and urinary bladder unremarkable. Again noted is delayed excretion of contrast from both kidneys, similar to prior study. Stomach/Bowel: Stomach, large and small bowel grossly unremarkable. Vascular/Lymphatic: Aortic and iliac calcifications. No aneurysm. No adenopathy. Reproductive: No visible focal abnormality. Other: Moderate to large volume ascites in the abdomen and pelvis, increased since prior study. No free air. Musculoskeletal: No acute bony abnormality or focal bone lesion. Degenerative disc disease in the lumbar spine. IMPRESSION: Findings suspicious for cirrhosis. Moderate to large volume ascites, increasing since prior study. Diffuse aortic atherosclerosis and coronary artery disease. Cardiomegaly. Electronically Signed   By: KRolm BaptiseM.D.   On: 01/13/2017 10:43   UKorea[place]acentesis  Result Date: 01/20/2017 INDICATION: Ascites EXAM: ULTRASOUND GUIDED DIAGNOSTIC AND THERAPEUTIC PARACENTESIS MEDICATIONS: None.  COMPLICATIONS: None immediate. PROCEDURE: Procedure, benefits, and risks of procedure were discussed with patient. Written informed consent for procedure was obtained. Time out protocol followed. Adequate collection of ascites localized by ultrasound in LEFT lower quadrant. Skin prepped and draped in usual sterile fashion. Skin and soft tissues anesthetized with 10 mL of 1% lidocaine. 5 [place] catheter placed into peritoneal cavity. 4 L of amber colored ascitic fluid aspirated by vacuum  bottle suction. Procedure tolerated well by patient without immediate complication. Approximately 1 hr following the procedure, patient developed itching. Physical exam shows truncal hives. Vital signs stable, no respiratory symptoms per patient. Uncertain if patient is having a reaction to the chlorhexidine scrub, the 1% lidocaine local anesthesia, the IV albumin given at the time of the procedure, or some other cause. Patient treated with 50 mg Benadryl IV. FINDINGS: A total of approximately 4 L of ascitic fluid was removed. Samples were sent to the laboratory as requested by the clinical team. IMPRESSION: Successful ultrasound-guided paracentesis yielding 4 liters of peritoneal fluid. Itching and hives following procedure as above. Electronically Signed   By: Lavonia Dana M.D.   On: 01/20/2017 15:34    Wt Readings from Last 3 Encounters:  01/21/17 263 lb 10.7 oz (119.6 kg)  09/23/16 259 lb (117.5 kg)  09/17/16 260 lb (117.9 kg)    EKG: rapid atrial fib, RBBBB, nonspecific ST-T changes  Physical Exam: Blood pressure 111/68, pulse 97, temperature 97.6 F (36.4 C), temperature source Oral, resp. rate (!) 24, height _0  (1.854 m), weight 263 lb 10.7 oz (119.6 kg), SpO2 93 %. Body mass index is 34.79 kg/m. General: Well developed, well nourished WM, in no acute distress. Head: Normocephalic, atraumatic, sclera non-icteric, no xanthomas, nares are without discharge.  Neck: Negative for carotid bruits. JVD not  elevated. Lungs: Diminished at bases L>R without wheezes, rales, or rhonchi. Breathing is unlabored. Heart: Irregularly irregular, S1 S2. No murmurs, rubs, or gallops appreciated. Abdomen: Moderately distended but remains soft, with normoactive bowel sounds. No hepatomegaly. No rebound/guarding. No obvious abdominal masses. Msk:  Strength and tone appear normal for age. Extremities: No clubbing or cyanosis. No edema.  Distal pedal pulses are 2+ and equal bilaterally. Neuro: Alert and oriented X 3. No facial asymmetry. No focal deficit. Moves all extremities spontaneously. Psych:  Responds to questions appropriately with a normal affect.     Assessment and Plan  24M With DM, HTN, HLD, CKD III, essential tremor, and permanent atrial fib on Coumadin who was admitted with abdominal discomfort and dyspnea felt due to ascites, with recently diagnosed cirrhosis of unclear cause. Cardiology asked to see for abnormal echo indicating right heart failure as well.   1. Cirrhosis of liver with ascites - undergoing workup by GI. Unclear if due to NASH or cardiac cirrhosis at this time.  2. Right heart failure findings - per review with Dr. Harl Bowie, recommend to continue to diurese as you are doing. The patient has d/c'd the patient's Lasix and transitioned over to [facility]. He is also on spironolactone as well. He will require further workup for right heart failure as OP, likely to include RHC once more euvolemic. See below for comprehensive thoughts.  3. Chronic atrial fib - Dr. Harl Bowie has increased propranolol to assist with improved rate control. Patient's INR is monitored by primary care. CHADSVASC 4. Interestingly he reports that he's required lower coumadin doses over the years. In light of his liver disease and thrombocytopenia he will need to be observed closely for bleeding.  4. CKD II - appears stable following initiation of diuretics this admission. Will need to be followed as OP.   TOC Visit  scheduled for 01/28/17 at 2:10pm with Jory Sims NP. Would recommend BMET and Magnesium at that visit. (I did not order as those results would end up coming back to me, and it is a different provider seeing him that day). Will also refer to the Advanced Heart Failure  clinic for further evaluation (spoke with Con Memos at our office to help facilitate).  Signed, Charlie Pitter PA-C 01/21/2017, 11:13 AM Pager: [phone] Attending note  Patient seen and discussed with PA Dunn, I agree with her documentation above. History of chronic afib on coumadin, HTN, HL, DM2, CKD, new diagnosis ofcirrhosis, admitted with SOB, ascites, LE edema, and weight gain. Initially had some elevated heart rates with his afib and was transiently on cardizem drip. Started on propanolol for rate control as well as in the setting of cirrhosis with possible varices and hand tremor.    S/p 4 liter paracentesis. I/Os incomplete this admission. Had been on lasix 137m po bid at home for over 8 years per his report.    K 3.8 Cr 1.16 WBC 9.3 Hgb 10.9 Plt 138 CT A/P: likely cirrhosis, moderate ascites, diffuse aortic atherosclerosis CXR patch left lower lobe opacity EKG afib RBBB, LPFB Echo: LVEF 55-60%, anteroseptal hypokinesis, mild MR, severe biatrial enlargement, severely enlarged RV with normal function, mild to mod TR though possibly underestimated   Patient with evidence of RV enlargement and dysfunction. Inadequate PASP measurement by echo and probable underestimation of TR based on available images, flattened ventricular septum in systole and diastole supports RV volume/pressure overload and systolic hepatic flow reversal suggests significant TR. RV TAPSE 16. Cannot eval diastolic function due to afib, however, severe biatrial enlargement would suggest diastolic dysfunction, likely restrictive, and likely playing a role in his RV dysfunction/pulm HTN. Other possibilities include cirrhosis, possible lung disease as he is  a former smoker as well. He is undergoing extensive autoimmune workup for his cirrhosis, f/u serologies though less likely to be etiology of his pulm HTN.   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 Would continue oral diuresis. On very high home lasix doses, will change to torsemide 646mbid. We will arrange outpatient follow up in CHF clinic to consider possible RHC once more euvolemic. I doubt in the long run given the likely etiology of his pulmonary HTN he will require pulmonary vasodilators. Pending RHC results and initial workup, can consider additional testing such as VQ. Consolidate inderal to [id number].    JonCarlyle Dolly

## 2017-01-21 NOTE — Discharge Summary (Signed)
Physician Discharge Summary  Melvin Wang:[id number] DOB: [birth year] DOA: 01/19/2017  PCP: Claretta Fraise, MD  Admit date: 01/19/2017 Discharge date: 01/21/2017  Admitted From: Home  Disposition:  Home   Recommendations for Outpatient Follow-up:  1. Follow up with PCP in 1-2 weeks 2. Follow up with cardiology at previously scheduled appointment 3. Follow up with Gastroenterology  4. Please obtain BMP/CBC in one week 5. Get INR drawn within 3 days and then again within 1 week   Home Health: No Equipment/Devices:  None   Discharge Condition: Stable CODE STATUS: Full Code Diet recommendation: Heart Healthy / Carb Modified   Brief/Interim Summary: Melvin Wang a 76 y.o.[name] medical history significant of HTN, afib on Coumadin, HLD, DM, CKD, and essential tremor presenting with SOB after recent diagnosis of cirrhosis with ascites. He reports that he can't eat, can't breathe properly, can't sleep - just 1 hour again last night, can't get a good cough. Came in last Monday, symptoms are no different today than a week ago. He had a CT scan last week and was referred to GI as an outpatient. Saw urology in December due to hematuria, had a CT then too that apparently showed the cirrhosis (not available in Epic)but the familyreportsthat they weren't told about the cirrhosis at that time. About 3 weeks ago, his belly started to suddenly enlarge, couldn't figure out why he was suddenly gaining weight. They have had a very stressful few months and so thought it was due to poor eating habits in the setting of stress. It got much worse quickly and so they came in. Has not had a fever, nothing to suspect PNA other than chronic cough and difficulty coughing up secretions.   Moderately heavy drinker for 2 years very remotely, social drinking for several years also very remotely. Overall, little alcohol lifetime ingestion. Afib for about 30 years. Thinks it was  related to carburetor cleaner exposure. Multiple electrical cardioversions without success. Rate control 90s-120s is pretty regular for him. Atenolol for rate control. Takes Coumadin, taking still and took 5 mg last night.   In the ER, given Rocephin and Azithromycin for CAP and started on Cardizem drip at 5/hr for afib with RVR.  Discharge Diagnoses:  Principal Problem:   Cirrhosis of liver with ascites ([place]) Active Problems:   Tremor, essential   Essential hypertension   Controlled type 2 diabetes mellitus with diabetic nephropathy (HCC)   Permanent atrial fibrillation (HCC)   Gout   Hyperlipemia   Acute respiratory failure (HCC)   Anemia   Thrombocytopenia (HCC)   Right ventricular enlargement   Volume overload   CKD (chronic kidney disease), stage II   Ascites  Cirrhosis of liver with ascites -Patient with several weeks of abdominal bloating and apparent ascites, which acutely worsened 1 week ago -Cirrhosis diagnosed via CT on 2/5, with moderate to large-volume ascites at that time - elevated Alk Phos (AP 251, 241 on 2/15, 476 on 10/16), there is concern for underlying hepatic malignancy; non-alcoholic cirrhosis is also likely -Paracentesis performed yesterday- 4L off - GI consult as inpatient - patient to follow up with GI outpt - torsemide 61m BID  Afib - started on 5/hr Cardizem in the ER and had reasonable rate control throughout my evaluation -Transitioned off cardizem to propranolol -He is anticoagulated with Coumadin for CHA2DS2-VASc score of 3, estimated stroke rate of 3.2% per year -s/p vitamin K 10 mg PO x 1 now in an effort to improve INR so that he can  have paracentesis more safely tomorrow; he understands the low risk of being off anticoagulation for the procedure and is agreeable to this - monitor heart rate - INR 2.98 this am - can restart coumadin - get INR drawn in 3 days and again in 1 week  HTN - Transition to propranolol instead for portal  HTN and tremor when no longer requiring Cardizem drip - Propranolol 49m BID  DM -No recent A1c (last in Epic was 7/0 in 12/16), will order -Continue lantus -Cover with SSI  CKD -Creatinine 1.12, improved  Anemia -Hgb 11.5, stable  Thrombocytopenia -Platelets 142, improved -Avoid heparin-containing agents and NSAIDs if platelets <100  Tremor - Continue Propranolol, as above  Gout -He has been taking Allopurinol for some time -Most recent flare was 3-4 weeks ago while on Allopurinol -This medication was stopped by Dr. SLivia Snellenon 2/5  -Certainly, there are risks associated with this medication in this patient; however, given the frequency of his attacks even while on the medication, he may continue to benefit from it. - will defer to PCP  HLD -Most recent FLP was 09/23/16 and was: Total 77; HDL 32; LDL 33; TG 62 -Holding statin  Discharge Instructions  Discharge Instructions    Call MD for:  difficulty breathing, headache or visual disturbances    Complete by:  As directed    Call MD for:  extreme fatigue    Complete by:  As directed    Call MD for:  hives    Complete by:  As directed    Call MD for:  persistant dizziness or light-headedness    Complete by:  As directed    Call MD for:  persistant nausea and vomiting    Complete by:  As directed    Call MD for:  severe uncontrolled pain    Complete by:  As directed    Call MD for:  temperature >100.4    Complete by:  As directed    Diet - low sodium heart healthy    Complete by:  As directed    Diet Carb Modified    Complete by:  As directed    Increase activity slowly    Complete by:  As directed      Allergies as of 01/21/2017      Reactions   Acyclovir And Related Hives   Metformin And Related Diarrhea   Amoxicillin Hives   Augmentin [amoxicillin-pot Clavulanate] Hives   Mavik [trandolapril] Other (See Comments)   Caused renal problems      Medication List    STOP taking these medications    atenolol 50 MG tablet Commonly known as:  TENORMIN   atorvastatin 40 MG tablet Commonly known as:  LIPITOR   furosemide 80 MG tablet Commonly known as:  LASIX   LANTUS SOLOSTAR 100 UNIT/ML Solostar Pen Generic drug:  Insulin Glargine     TAKE these medications   B-D UF III MINI PEN NEEDLES 31G X 5 MM Misc Generic drug:  Insulin Pen Needle USE 1 TIME DAILY WITH LANTUS SOLOSTAR   calcitRIOL 0.5 MCG capsule Commonly known as:  ROCALTROL daily   ONE TOUCH ULTRA TEST test strip Generic drug:  glucose blood USE TO CHECK BG UP TO 4 TIMES A DAY   pantoprazole 40 MG tablet Commonly known as:  PROTONIX Take 1 tablet (40 mg total) by mouth 2 (two) times daily before a meal. Start taking on:  01/22/2017   propranolol 40 MG tablet Commonly known as:  INDERAL Take 1 tablet (40 mg total) by mouth 2 (two) times daily.   spironolactone 100 MG tablet Commonly known as:  ALDACTONE Take 1 tablet (100 mg total) by mouth daily with breakfast. Start taking on:  01/22/2017   torsemide 20 MG tablet Commonly known as:  DEMADEX Take 3 tablets (60 mg total) by mouth 2 (two) times daily.   warfarin 10 MG tablet Commonly known as:  COUMADIN Take 5 mg by mouth See admin instructions. Take as directed by anticoagulation dose - 1/2 tablet daily except take no Warfarin on Monday or Friday      Follow-up Information    Jory Sims, NP Follow up.   Specialties:  Nurse Practitioner, Radiology, Cardiology Why:  [facility] (located on main floor of [facility] - 01/28/17 at 2:10pm. Please arrive 15 minutes early to check in. Contact information: [place] [address] [phone]        [facility] Follow up.   Specialty:  Cardiology Why:  You have been referred to the [facility] in [place]. This clinic works closely with [facility] and specializes in the kind of heart failure you  have been diagnosed with. You should expect a phone call to arrange this. Contact information: [address] [id number] [place], [place]. Schedule an appointment as soon as possible for a visit in 1 week(s).   Specialty:  Family Medicine Contact information: [address] [place] [address] [phone]          Allergies  Allergen Reactions  . Acyclovir And Related Hives  . Metformin And Related Diarrhea  . Amoxicillin Hives  . Augmentin [Amoxicillin-Pot Clavulanate] Hives  . Mavik [Trandolapril] Other (See Comments)    Caused renal problems    Consultations:  Gastroenterology  Diabetes Coordinator  Cardiology   Procedures/Studies: Dg Chest 2 View  Result Date: 01/19/2017 CLINICAL DATA:  Shortness of breath, dyspnea with exertion EXAM: CHEST  2 VIEW COMPARISON:  None. FINDINGS: Patchy left lower lobe opacity, suspicious for pneumonia, less likely atelectasis. Right lung is clear. No frank interstitial edema. Possible small left pleural effusion. No pneumothorax. Mild cardiomegaly. IMPRESSION: Patchy left lower lobe opacity, suspicious for pneumonia, less likely atelectasis. Possible small left pleural effusion. Electronically Signed   By: Julian Hy M.D.   On: 01/19/2017 16:32   Ct Abdomen Pelvis W Contrast  Result Date: 01/13/2017 CLINICAL DATA:  Abdominal distension. EXAM: CT ABDOMEN AND PELVIS WITH CONTRAST TECHNIQUE: Multidetector CT imaging of the abdomen and pelvis was performed using the standard protocol following bolus administration of intravenous contrast. CONTRAST:  122m ISOVUE-300 IOPAMIDOL (ISOVUE-300) INJECTION 61% COMPARISON:  11/14/2016 FINDINGS: Lower chest: Mild cardiomegaly. Diffuse coronary artery calcifications in the visualized right coronary artery. Bibasilar atelectasis. No effusions. Hepatobiliary: Mild enlargement of the left hepatic lobe and subtle nodularity to the  liver surface suggests cirrhosis. No visible focal abnormality or biliary ductal dilatation. No visible gallstones. Pancreas: No focal abnormality or ductal dilatation. Spleen: No focal abnormality.  Normal size. Adrenals/Urinary Tract: Small cyst in the midpole of the right kidney, otherwise unremarkable kidneys. Adrenal glands and urinary bladder unremarkable. Again noted is delayed excretion of contrast from both kidneys, similar to prior study. Stomach/Bowel: Stomach, large and small bowel grossly unremarkable. Vascular/Lymphatic: Aortic and iliac calcifications. No aneurysm. No adenopathy. Reproductive: No visible focal abnormality. Other: Moderate to large volume ascites  in the abdomen and pelvis, increased since prior study. No free air. Musculoskeletal: No acute bony abnormality or focal bone lesion. Degenerative disc disease in the lumbar spine. IMPRESSION: Findings suspicious for cirrhosis. Moderate to large volume ascites, increasing since prior study. Diffuse aortic atherosclerosis and coronary artery disease. Cardiomegaly. Electronically Signed   By: Rolm Baptise M.D.   On: 01/13/2017 10:43   US Paracentesis  Result Date: 01/20/2017 INDICATION: Ascites EXAM: ULTRASOUND GUIDED DIAGNOSTIC AND THERAPEUTIC PARACENTESIS MEDICATIONS: None. COMPLICATIONS: None immediate. PROCEDURE: Procedure, benefits, and risks of procedure were discussed with patient. Written informed consent for procedure was obtained. Time out protocol followed. Adequate collection of ascites localized by ultrasound in LEFT lower quadrant. Skin prepped and draped in usual sterile fashion. Skin and soft tissues anesthetized with 10 mL of 1% lidocaine. 5 [place] Yueh catheter placed into peritoneal cavity. 4 L of amber colored ascitic fluid aspirated by vacuum bottle suction. Procedure tolerated well by patient without immediate complication. Approximately 1 hr following the procedure, patient developed itching. Physical exam shows  truncal hives. Vital signs stable, no respiratory symptoms per patient. Uncertain if patient is having a reaction to the chlorhexidine scrub, the 1% lidocaine local anesthesia, the IV albumin given at the time of the procedure, or some other cause. Patient treated with 50 mg Benadryl IV. FINDINGS: A total of approximately 4 L of ascitic fluid was removed. Samples were sent to the laboratory as requested by the clinical team. IMPRESSION: Successful ultrasound-guided paracentesis yielding 4 liters of peritoneal fluid. Itching and hives following procedure as above. Electronically Signed   By: Lavonia Dana M.D.   On: 01/20/2017 15:34   Echocardiogram: Left ventricle: The cavity size was normal. Wall thickness was increased in a pattern of mild LVH. Systolic function was normal. The estimated ejection fraction was in the range of 55% to 60%. Regional wall motion abnormality: Hypokinesis of the basal-mid anteroseptal myocardium. Aortic valve: Valve area (VTI): 2.32 cm^2. Valve area (Vmax):   2.32 cm^2. Valve area (Vmean): 2.16 cm^2. Mitral valve: There was mild regurgitation. Left atrium: The atrium was severely dilated. Right ventricle: The cavity size was severely dilated. Right atrium: The atrium was severely dilated. Atrial septum: No defect or patent foramen ovale was identified. Tricuspid valve: There was mild-moderate regurgitation. The available color Doppler images of the TR may underestimate its severity, especially in the setting of a flattened ventricular   septum in diastole and systolic hepatic flow reversal which would suggest more severe TR. Pulmonary arteries: Systolic pressure could not be accurately estimated. Inadequate TR jet, available Doppler likely underestimates. Inferior vena cava: The vessel was dilated. The respirophasic diameter changes were blunted (< 50%), consistent with elevated central venous pressure. Pericardium, extracardiac: There is a large left pleural effusion. Technically  difficult study. Echocontrast was used to enhance   visualization.   Subjective:  Patient says he is hopeful to go home after his EGD today.  Voices that he feels markedly better after getting his paracentesis.  Discussed new medications at discharge.  Discharge Exam: Vitals:   01/21/17 1145 01/21/17 1604  BP:    Pulse: 68 (!) 54  Resp: 19 (!) 24  Temp:  97.8 F (36.6 C)   Vitals:   01/21/17 1155 01/21/17 1200 01/21/17 1205 01/21/17 1604  BP:      Pulse:    (!) 54  Resp:    (!) 24  Temp:    97.8 F (36.6 C)  TempSrc:    Oral  SpO2: (!) 89%  91% 92% 91%  Weight:      Height:        General: Pt is alert, awake, not in acute distress Cardiovascular: RRR, S1/S2 +, no rubs, no gallops Respiratory: few rales at bases, no wheezing, no rhonchi Abdominal: Soft, NT, ND, bowel sounds + Extremities: no edema, no cyanosis    The results of significant diagnostics from this hospitalization (including imaging, microbiology, ancillary and laboratory) are listed below for reference.     Microbiology: Recent Results (from the past 240 hour(s))  MRSA PCR Screening     Status: None   Collection Time: 01/19/17  9:07 PM  Result Value Ref Range Status   MRSA by PCR NEGATIVE NEGATIVE Final    Comment:        The GeneXpert MRSA Assay (FDA approved for NASAL specimens only), is one component of a comprehensive MRSA colonization surveillance program. It is not intended to diagnose MRSA infection nor to guide or monitor treatment for MRSA infections.   Gram stain     Status: None   Collection Time: 01/20/17  2:18 PM  Result Value Ref Range Status   Specimen Description ASCITIC COLLECTED BY DOCTOR  Final   Special Requests ASCITIC  Final   Gram Stain   Final    CYTOSPIN SMEAR NO ORGANISMS SEEN WBC PRESENT,BOTH PMN AND MONONUCLEAR    Report Status 01/20/2017 FINAL  Final  Culture, body fluid-bottle     Status: None (Preliminary result)   Collection Time: 01/20/17  2:18 PM   Result Value Ref Range Status   Specimen Description ASCITIC COLLECTED BY DOCTOR  Final   Special Requests BOTTLES DRAWN AEROBIC AND ANAEROBIC 10 CC EACH  Final   Culture NO GROWTH < 24 HOURS  Final   Report Status PENDING  Incomplete     Labs: BNP (last 3 results) No results for input(s): BNP in the last 8760 hours. Basic Metabolic Panel:  Recent Labs Lab 01/19/17 1545 01/20/17 0420 01/21/17 0428  NA 135 135 137  K 4.1 3.7 3.8  CL 94* 96* 96*  CO2 32 30 33*  GLUCOSE 88 48* 92  BUN 50* 48* 46*  CREATININE 1.12 1.06 1.16  CALCIUM 8.9 8.8* 8.7*   Liver Function Tests:  Recent Labs Lab 01/19/17 1545 01/20/17 1139 01/20/17 1155  AST 35  --  32  ALT 21  --  20  ALKPHOS 251*  --  230*  BILITOT 1.9*  --  2.1*  PROT 7.9 DISREGUARD PREVIOUS RESULTS. TEST RUN ON SERUM NOT BODY FLUID. TEST REORDERED. M7672 TO BE RUN ON BODY FLUID.  7.5  ALBUMIN 3.4*  --  3.3*   No results for input(s): LIPASE, AMYLASE in the last 168 hours.  Recent Labs Lab 01/19/17 1545  AMMONIA 30   CBC:  Recent Labs Lab 01/19/17 1545 01/20/17 0420  WBC 9.5 9.3  NEUTROABS 7.2  --   HGB 11.5* 10.9*  HCT 34.2* 32.2*  MCV 101.2* 101.3*  PLT 142* 138*   Cardiac Enzymes: No results for input(s): CKTOTAL, CKMB, CKMBINDEX, TROPONINI in the last 168 hours. BNP: Invalid input(s): POCBNP CBG:  Recent Labs Lab 01/20/17 2112 01/21/17 0738 01/21/17 1116 01/21/17 1241 01/21/17 1603  GLUCAP 102* 75 72 92 111*   D-Dimer No results for input(s): DDIMER in the last 72 hours. Hgb A1c  Recent Labs  01/19/17 1545  HGBA1C 5.4   Lipid Profile No results for input(s): CHOL, HDL, LDLCALC, TRIG, CHOLHDL, LDLDIRECT in the last 72 hours. Thyroid  function studies No results for input(s): TSH, T4TOTAL, T3FREE, THYROIDAB in the last 72 hours.  Invalid input(s): FREET3 Anemia work up  Recent Labs  01/20/17 1155  FERRITIN 196   Urinalysis    Component Value Date/Time   COLORURINE YELLOW  02/19/2014 [place] 02/19/2014 1612   APPEARANCEUR Clear 09/28/2013 0845   LABSPEC 1.010 02/19/2014 1612   PHURINE 5.5 02/19/2014 1612   GLUCOSEU NEGATIVE 02/19/2014 1612   HGBUR NEGATIVE 02/19/2014 1612   BILIRUBINUR neg 12/07/2015 0939   BILIRUBINUR Negative 09/28/2013 0845   KETONESUR NEGATIVE 02/19/2014 1612   PROTEINUR neg 12/07/2015 0939   PROTEINUR NEGATIVE 02/19/2014 1612   UROBILINOGEN negative 12/07/2015 0939   UROBILINOGEN 0.2 02/19/2014 1612   NITRITE neg 12/07/2015 0939   NITRITE NEGATIVE 02/19/2014 1612   LEUKOCYTESUR Negative 12/07/2015 0939   LEUKOCYTESUR Negative 09/28/2013 0845   Sepsis Labs Invalid input(s): PROCALCITONIN,  WBC,  LACTICIDVEN Microbiology Recent Results (from the past 240 hour(s))  MRSA PCR Screening     Status: None   Collection Time: 01/19/17  9:07 PM  Result Value Ref Range Status   MRSA by PCR NEGATIVE NEGATIVE Final    Comment:        The GeneXpert MRSA Assay (FDA approved for NASAL specimens only), is one component of a comprehensive MRSA colonization surveillance program. It is not intended to diagnose MRSA infection nor to guide or monitor treatment for MRSA infections.   Gram stain     Status: None   Collection Time: 01/20/17  2:18 PM  Result Value Ref Range Status   Specimen Description ASCITIC COLLECTED BY DOCTOR  Final   Special Requests ASCITIC  Final   Gram Stain   Final    CYTOSPIN SMEAR NO ORGANISMS SEEN WBC PRESENT,BOTH PMN AND MONONUCLEAR    Report Status 01/20/2017 FINAL  Final  Culture, body fluid-bottle     Status: None (Preliminary result)   Collection Time: 01/20/17  2:18 PM  Result Value Ref Range Status   Specimen Description ASCITIC COLLECTED BY DOCTOR  Final   Special Requests BOTTLES DRAWN AEROBIC AND ANAEROBIC 10 CC EACH  Final   Culture NO GROWTH < 24 HOURS  Final   Report Status PENDING  Incomplete     Time coordinating discharge:  35 minutes  SIGNED:   Loretha Stapler,  MD  [facility] 01/21/2017, 4:30 PM Pager [phone] If 7PM-7AM, please contact night-coverage [email or web address] Password [email or web address]

## 2017-01-21 NOTE — Telephone Encounter (Signed)
PATIENT WAS A NO SHOW AND LETTER SENT  °

## 2017-01-21 NOTE — Telephone Encounter (Signed)
Patient is actually hospitalized at [facility] and we are following him. Is it possible to retrieve the letter?   He needs follow-up with [place] in 1 week from today. May use an urgent with me if needed.

## 2017-01-21 NOTE — Progress Notes (Signed)
1640 d/c instructions and paperwork given to patient and patient's wife. IV catheter removed from RIGHT AC, intact w/no s/s of infection/infiltration noted. Patient transferred to vehicle via w/c, wife to transport patient home.

## 2017-01-21 NOTE — Telephone Encounter (Signed)
RETRIEVED LETTER AND ICU NURSE WILL GIVE HIM THE DATE AND TIME OF THIS APPOINTMENT

## 2017-01-21 NOTE — Op Note (Signed)
[facility] Patient Name: Melvin Wang Procedure Date: 01/21/2017 11:39 AM MRN: [id number] Date of Birth: [birth year] Attending MD: Barney Drain , MD CSN: [id number] Age: 76 Admit Type: Outpatient Procedure:                Upper GI endoscopy WITH COLD FORCEPS BIOPSY/                            CAUTERY AT BIOPSY SITESx5 Indications:              Variceal screening (no known varices or prior                            bleeding) Providers:                Barney Drain, MD, Janeece Riggers, RN, Isabella Stalling,                            Technician Referring MD:             Arnoldo Lenis MD Medicines:                Meperidine 25 mg IV, Midazolam 3 mg IV Complications:            No immediate complications. Estimated Blood Loss:     Estimated blood loss was minimal. Estimated blood                            loss was minimal. Procedure:                Pre-Anesthesia Assessment:                           - Prior to the procedure, a History and Physical                            was performed, and patient medications and                            allergies were reviewed. The patient's tolerance of                            previous anesthesia was also reviewed. The risks                            and benefits of the procedure and the sedation                            options and risks were discussed with the patient.                            All questions were answered, and informed consent                            was obtained. Prior Anticoagulants: The patient has  taken Coumadin (warfarin), last dose was 2 days                            prior to procedure. ASA Grade Assessment: III - A                            patient with severe systemic disease. After                            reviewing the risks and benefits, the patient was                            deemed in satisfactory condition to undergo the                            procedure.  After obtaining informed consent, the                            endoscope was passed under direct vision.                            Throughout the procedure, the patient's blood                            pressure, pulse, and oxygen saturations were                            monitored continuously. The [id number]                            scope was introduced through the mouth, and                            advanced to the second part of duodenum. The upper                            GI endoscopy was accomplished without difficulty.                            The patient tolerated the procedure well. Scope In: 11:57:40 AM Scope Out: 12:11:14 PM Total Procedure Duration: 0 hours 13 minutes 34 seconds  Findings:      The examined esophagus was normal.      Mild portal hypertensive gastropathy was found in the gastric body.      Patchy mild inflammation characterized by erosions and erythema was       found in the gastric antrum. Biopsies were taken with a cold forceps for       Helicobacter pylori testing. Coagulation(3 SECs, 25w) for hemostasis of       bleeding caused by the procedure using heater probe was successful.      Multiple small sessile polyps with no bleeding and no stigmata of recent       bleeding were found in the gastric fundus and in the gastric body. This       was biopsied with  a cold forceps for histology.      Diffuse moderate inflammation characterized by erosions, erythema and       friability was found in the duodenal bulb and in the second portion of       the duodenum.      -NO VARICES INTEH ESOPHAGUS, STOMACH, OR DUODENUM Impression:               - MILD Portal hypertensive gastropathy.                           - MILD EROSIVE Gastritis AND MODERATE EROSIVE                            DUODENITIS. Biopsied.                           - Multiple gastric polyps. Biopsied. Moderate Sedation:      Moderate (conscious) sedation was administered by the  endoscopy nurse       and supervised by the endoscopist. The following parameters were       monitored: oxygen saturation, heart rate, blood pressure, and response       to care. Total physician intraservice time was 18 minutes. Recommendation:           - Cardiac diet and low sodium diet.                           - Continue present medications. MAY CONTINUE                            COUMADIN. MONITOR FOR BLEEDING.                           - Await pathology results.                           - Return to my office in 6 months. NEEDS REPEAT US                            Q6 MOS.                           - Return patient to hospital ward.                           - Use Protonix (pantoprazole) 40 mg PO BID for 3                            months THEN ONCE DAILY INDEFINITELY. Procedure Code(s):        --- Professional ---                           [phone], Esophagogastroduodenoscopy, flexible,                            transoral; with biopsy, single or multiple  [id number], Moderate sedation services provided by the                            same physician or other qualified health care                            professional performing the diagnostic or                            therapeutic service that the sedation supports,                            requiring the presence of an independent trained                            observer to assist in the monitoring of the                            patient's level of consciousness and physiological                            status; initial 15 minutes of intraservice time,                            patient age 17 years or older Diagnosis Code(s):        --- Professional ---                           K76.6, Portal hypertension                           K31.89, Other diseases of stomach and duodenum                           K29.70, Gastritis, unspecified, without bleeding                           K31.7, Polyp of  stomach and duodenum                           K29.80, Duodenitis without bleeding                           Z13.810, Encounter for screening for upper                            gastrointestinal disorder CPT copyright 2016 [employer]. All rights reserved. The codes documented in this report are preliminary and upon coder review may  be revised to meet current compliance requirements. Barney Drain, MD Barney Drain, MD 01/21/2017 12:25:32 PM This report has been signed electronically. Number of Addenda: 0

## 2017-01-21 NOTE — Progress Notes (Signed)
Subjective: Breathing much improved and feels close to baseline. No abdominal pain. Discomfort improved. Wants to go home. Agreeable to EGD today with hopeful discharge thereafter.    Objective: Vital signs in last 24 hours: Temp:  [97.6 F (36.4 C)-98.2 F (36.8 C)] 97.6 F (36.4 C) (02/13 0744) Pulse Rate:  [54-109] 75 (02/13 0744) Resp:  [16-29] 24 (02/13 0744) BP: (99-115)/(68-88) 102/76 (02/13 0400) SpO2:  [87 %-97 %] 97 % (02/13 0744) Weight:  [263 lb 10.7 oz (119.6 kg)] 263 lb 10.7 oz (119.6 kg) (02/13 0452) Last BM Date: 01/19/17 General:   Alert and oriented, pleasant, sallow-appearing.  Heart:  S1, S2 present, irregularly irregular  Lungs: diminished bases, normal respiratory effort,  Abdomen:  Bowel sounds present, moderately distended but softer than yesterday's exam, still with moderate tense ascites  Extremities:  1+ pitting lower extremity edema to knee, improved pedal edema  Neurologic:  Alert and  oriented x4 Psych:  Alert and cooperative. Normal mood and affect.  Intake/Output from previous day: 02/12 0701 - 02/13 0700 In: 534.3 [P.O.:480; I.V.:54.3] Out: -  Intake/Output this shift: No intake/output data recorded.  Lab Results:  Recent Labs  01/19/17 1545 01/20/17 0420  WBC 9.5 9.3  HGB 11.5* 10.9*  HCT 34.2* 32.2*  PLT 142* 138*   BMET  Recent Labs  01/19/17 1545 01/20/17 0420 01/21/17 0428  NA 135 135 137  K 4.1 3.7 3.8  CL 94* 96* 96*  CO2 32 30 33*  GLUCOSE 88 48* 92  BUN 50* 48* 46*  CREATININE 1.12 1.06 1.16  CALCIUM 8.9 8.8* 8.7*   LFT  Recent Labs  01/19/17 1545 01/20/17 1139 01/20/17 1155 01/20/17 1418  PROT 7.9 DISREGUARD PREVIOUS RESULTS. TEST RUN ON SERUM NOT BODY FLUID. TEST REORDERED. [id number] TO BE RUN ON BODY FLUID.  7.5 3.2*  ALBUMIN 3.4*  --  3.3*  --   AST 35  --  32  --   ALT 21  --  20  --   ALKPHOS 251*  --  230*  --   BILITOT 1.9*  --  2.1*  --   BILIDIR  --   --  0.7*  --   IBILI  --   --  1.4*  --     PT/INR  Recent Labs  01/20/17 0420 01/21/17 0428  LABPROT 31.6* 23.8*  INR 2.98 2.08   Hepatitis Panel  Recent Labs  01/20/17 1155  HEPBSAG Negative  HCVAB <0.1     Studies/Results: Dg Chest 2 View  Result Date: 01/19/2017 CLINICAL DATA:  Shortness of breath, dyspnea with exertion EXAM: CHEST  2 VIEW COMPARISON:  None. FINDINGS: Patchy left lower lobe opacity, suspicious for pneumonia, less likely atelectasis. Right lung is clear. No frank interstitial edema. Possible small left pleural effusion. No pneumothorax. Mild cardiomegaly. IMPRESSION: Patchy left lower lobe opacity, suspicious for pneumonia, less likely atelectasis. Possible small left pleural effusion. Electronically Signed   By: Julian Hy M.D.   On: 01/19/2017 16:32   US Paracentesis  Result Date: 01/20/2017 INDICATION: Ascites EXAM: ULTRASOUND GUIDED DIAGNOSTIC AND THERAPEUTIC PARACENTESIS MEDICATIONS: None. COMPLICATIONS: None immediate. PROCEDURE: Procedure, benefits, and risks of procedure were discussed with patient. Written informed consent for procedure was obtained. Time out protocol followed. Adequate collection of ascites localized by ultrasound in LEFT lower quadrant. Skin prepped and draped in usual sterile fashion. Skin and soft tissues anesthetized with 10 mL of 1% lidocaine. 5 [place] Yueh catheter placed into peritoneal cavity. 4 L of  amber colored ascitic fluid aspirated by vacuum bottle suction. Procedure tolerated well by patient without immediate complication. Approximately 1 hr following the procedure, patient developed itching. Physical exam shows truncal hives. Vital signs stable, no respiratory symptoms per patient. Uncertain if patient is having a reaction to the chlorhexidine scrub, the 1% lidocaine local anesthesia, the IV albumin given at the time of the procedure, or some other cause. Patient treated with 50 mg Benadryl IV. FINDINGS: A total of approximately 4 L of ascitic fluid was  removed. Samples were sent to the laboratory as requested by the clinical team. IMPRESSION: Successful ultrasound-guided paracentesis yielding 4 liters of peritoneal fluid. Itching and hives following procedure as above. Electronically Signed   By: Lavonia Dana M.D.   On: 01/20/2017 15:34   ECHO 01/20/17:  - Left ventricle: The cavity size was normal. Wall thickness was   increased in a pattern of mild LVH. Systolic function was normal.   The estimated ejection fraction was in the range of 55% to 60%. - Regional wall motion abnormality: Hypokinesis of the basal-mid   anteroseptal myocardium. - Aortic valve: Valve area (VTI): 2.32 cm^2. Valve area (Vmax):   2.32 cm^2. Valve area (Vmean): 2.16 cm^2. - Mitral valve: There was mild regurgitation. - Left atrium: The atrium was severely dilated. - Right ventricle: The cavity size was severely dilated. - Right atrium: The atrium was severely dilated. - Atrial septum: No defect or patent foramen ovale was identified. - Tricuspid valve: There was mild-moderate regurgitation. The   available color Doppler images of the TR may underestimate its   severity, especially in the setting of a flattened ventricular   septum in diastole and systolic hepatic flow reversal which would   suggest more severe TR. - Pulmonary arteries: Systolic pressure could not be accurately   estimated. Inadequate TR jet, available Doppler likely   underestimates. - Inferior vena cava: The vessel was dilated. The respirophasic   diameter changes were blunted (< 50%), consistent with elevated   central venous pressure. - Pericardium, extracardiac: There is a large left pleural   effusion. - Technically difficult study. Echocontrast was used to enhance   visualization.  Assessment: 76 year old male admitted with new onset ascites felt to be  predominantly secondary to congestive hepatopathy, unable to exclude underlying NASH. Clinically improved after first paracentesis  yesterday (4 liters removed). Anxiously desires to go home. Agrees to EGD for variceal screening while inpatient. INR 2.0 and still appropriate for diagnostic EGD only today.   Fluid analysis reviewed with no evidence of SBP as expected. Fluid culture negative thus far, cytology pending. SAAG score 1.7, consistent with portal hypertension. Ascitic protein level greater than 2.5, which can be seen in hepatic congestion secondary to heart failure. Hep B surface antigen negative, Hep C antibody negative, ferritin 196, transferrin 193, ceruloplasmin elevated but an acute phase reactant (normally low in Wilson's disease) ANA, ASMA, AMA pending. ECHO with mild LVH, EF 55-60%, right ventricle and atrium severely dilated, with RV pressure and volume overload, systolic function low normal.   Will proceed with cardiology consult today to establish care; barring any clinical concerns, he should be appropriate for discharge this afternoon after EGD.    Plan: EGD with Dr. Oneida Alar for variceal screening today Follow-up on pending serologies Cardiology consult placed and notified personally.  Will need follow-up as outpatient to arrange colonoscopy and further cirrhosis care Anticipate a repeat outpatient paracentesis will be needed Lasix 40 mg daily and aldactone 100 mg daily Will arrange  close interval outpatient follow-up with our practice  As long as clinically stable, hopeful discharge this afternoon after EGD   Melvin Wang, ANP-BC [facility]    LOS: 2 days    01/21/2017, 7:50 AM

## 2017-01-23 ENCOUNTER — Encounter (HOSPITAL_COMMUNITY): Payer: Self-pay | Admitting: Gastroenterology

## 2017-01-23 NOTE — Progress Notes (Signed)
Kept appt and put on recall for slf

## 2017-01-24 ENCOUNTER — Encounter: Payer: Self-pay | Admitting: Family Medicine

## 2017-01-24 ENCOUNTER — Ambulatory Visit (INDEPENDENT_AMBULATORY_CARE_PROVIDER_SITE_OTHER): Payer: Medicare Other | Admitting: Family Medicine

## 2017-01-24 VITALS — BP 101/62 | HR 55 | Temp 97.0°F | Ht 73.0 in | Wt 265.0 lb

## 2017-01-24 DIAGNOSIS — E1121 Type 2 diabetes mellitus with diabetic nephropathy: Secondary | ICD-10-CM

## 2017-01-24 DIAGNOSIS — Z794 Long term (current) use of insulin: Secondary | ICD-10-CM | POA: Diagnosis not present

## 2017-01-24 DIAGNOSIS — R188 Other ascites: Secondary | ICD-10-CM

## 2017-01-24 DIAGNOSIS — K7460 Unspecified cirrhosis of liver: Secondary | ICD-10-CM

## 2017-01-24 LAB — CBC WITH DIFFERENTIAL/PLATELET
Basophils Absolute: 0.1 10*3/uL (ref 0.0–0.2)
Basos: 1 %
EOS (ABSOLUTE): 0.3 10*3/uL (ref 0.0–0.4)
EOS: 3 %
Hematocrit: 34.8 % — ABNORMAL LOW (ref 37.5–51.0)
Hemoglobin: 11.5 g/dL — ABNORMAL LOW (ref 13.0–17.7)
IMMATURE GRANULOCYTES: 0 %
Immature Grans (Abs): 0 10*3/uL (ref 0.0–0.1)
Lymphocytes Absolute: 1.2 10*3/uL (ref 0.7–3.1)
Lymphs: 11 %
MCH: 33 pg (ref 26.6–33.0)
MCHC: 33 g/dL (ref 31.5–35.7)
MCV: 100 fL — ABNORMAL HIGH (ref 79–97)
MONOS ABS: 1.2 10*3/uL — AB (ref 0.1–0.9)
Monocytes: 11 %
NEUTROS PCT: 74 %
Neutrophils Absolute: 8.3 10*3/uL — ABNORMAL HIGH (ref 1.4–7.0)
PLATELETS: 144 10*3/uL — AB (ref 150–379)
RBC: 3.48 x10E6/uL — AB (ref 4.14–5.80)
RDW: 18.5 % — AB (ref 12.3–15.4)
WBC: 11 10*3/uL — AB (ref 3.4–10.8)

## 2017-01-24 LAB — CMP14+EGFR
ALT: 14 IU/L (ref 0–44)
AST: 23 IU/L (ref 0–40)
Albumin/Globulin Ratio: 1 — ABNORMAL LOW (ref 1.2–2.2)
Albumin: 3.5 g/dL (ref 3.5–4.8)
Alkaline Phosphatase: 243 IU/L — ABNORMAL HIGH (ref 39–117)
BUN/Creatinine Ratio: 39 — ABNORMAL HIGH (ref 10–24)
BUN: 44 mg/dL — ABNORMAL HIGH (ref 8–27)
Bilirubin Total: 2 mg/dL — ABNORMAL HIGH (ref 0.0–1.2)
CO2: 29 mmol/L (ref 18–29)
CREATININE: 1.12 mg/dL (ref 0.76–1.27)
Calcium: 8.8 mg/dL (ref 8.6–10.2)
Chloride: 93 mmol/L — ABNORMAL LOW (ref 96–106)
GFR calc Af Amer: 75 mL/min/{1.73_m2} (ref >59)
GFR calc non Af Amer: 65 mL/min/{1.73_m2} (ref >59)
GLOBULIN, TOTAL: 3.6 g/dL (ref 1.5–4.5)
Glucose: 56 mg/dL — ABNORMAL LOW (ref 65–99)
POTASSIUM: 5 mmol/L (ref 3.5–5.2)
SODIUM: 140 mmol/L (ref 134–144)
Total Protein: 7.1 g/dL (ref 6.0–8.5)

## 2017-01-24 NOTE — Progress Notes (Signed)
Subjective:  Patient ID: Melvin Wang, male    DOB: 11-16-1941  Age: 76 y.o. MRN: [id number]  CC: Hospitalization Follow-up (pt here today for hospital follow up and he was dx'd cirhosis of the liver)   HPI TAYSEN BUSHART presents for recheck after release from [facility]. Dx with cirrhosis. Had several liters of fluid drawn off with pericentesis. Due for follow up with GI in several days. Fluid has begun to reaccumulate. Breathing is  Better now than at admission.  Using Lantus as prior to admission 40 units daily. Denies lows.    Patient in for follow-up of atrial fibrillation. Patient denies any recent bouts of chest pain or palpitations. Additionally, patient is taking anticoagulants. Patient denies any recent excessive bleeding episodes including epistaxis, bleeding from the gums, genitalia, rectal bleeding or hematuria. Additionally there has been no excessive bruising.   History Rudie has a past medical history of Abnormality of gait; Cataract; CKD (chronic kidney disease), stage III; Diabetes mellitus ([place]); Essential tremor; Foot ulcer, right ([place]) (03/2013); Gout; Hyperlipidemia; Hypertension; and Permanent atrial fibrillation ([place]).   He has a past surgical history that includes Cataract extraction w/PHACO (Left, 04/19/2013); Cataract extraction w/PHACO (Right, 07/15/2013); and Esophagogastroduodenoscopy (N/A, 01/21/2017).   His family history includes Cancer in his paternal uncle; Colon cancer (age of onset: 39) in his father.He reports that he quit smoking about 32 years ago. His smoking use included Cigarettes. He has a 15.00 pack-year smoking history. He has never used smokeless tobacco. He reports that he does not drink alcohol or use drugs.    ROS Review of Systems  Constitutional: Negative for chills, diaphoresis and fever.  HENT: Negative for rhinorrhea and sore throat.   Respiratory: Negative for cough and shortness of breath.   Cardiovascular:  Negative for chest pain.  Gastrointestinal: Negative for abdominal pain.  Musculoskeletal: Negative for arthralgias and myalgias.  Skin: Positive for color change (jaundiced ). Negative for rash.  Neurological: Negative for weakness and headaches.    Objective:  BP 101/62   Pulse (!) 55   Temp 97 F (36.1 C) (Oral)   Ht 6' 1" (1.854 m)   Wt 265 lb (120.2 kg)   BMI 34.96 kg/m   BP Readings from Last 3 Encounters:  01/24/17 101/62  01/21/17 111/68  01/13/17 112/73    Wt Readings from Last 3 Encounters:  01/24/17 265 lb (120.2 kg)  01/21/17 263 lb 10.7 oz (119.6 kg)  09/23/16 259 lb (117.5 kg)     Physical Exam  Constitutional: He appears well-developed and well-nourished.  HENT:  Head: Normocephalic and atraumatic.  Right Ear: Tympanic membrane and external ear normal. No decreased hearing is noted.  Left Ear: Tympanic membrane and external ear normal. No decreased hearing is noted.  Mouth/Throat: No oropharyngeal exudate or posterior oropharyngeal erythema.  Scleral icteris  Eyes: Pupils are equal, round, and reactive to light.  Neck: Normal range of motion. Neck supple.  Cardiovascular: Normal rate and regular rhythm.   No murmur heard. Pulmonary/Chest: Breath sounds normal. No respiratory distress.  Abdominal: Soft. Bowel sounds are normal. He exhibits distension (with shifting dullness). There is no tenderness. There is no rebound.  Skin: Skin is warm and dry.  Vitals reviewed.   Dg Chest 2 View  Result Date: 01/19/2017 CLINICAL DATA:  Shortness of breath, dyspnea with exertion EXAM: CHEST  2 VIEW COMPARISON:  None. FINDINGS: Patchy left lower lobe opacity, suspicious for pneumonia, less likely atelectasis. Right lung is clear. No frank  interstitial edema. Possible small left pleural effusion. No pneumothorax. Mild cardiomegaly. IMPRESSION: Patchy left lower lobe opacity, suspicious for pneumonia, less likely atelectasis. Possible small left pleural effusion.  Electronically Signed   By: Julian Hy M.D.   On: 01/19/2017 16:32   US Paracentesis  Result Date: 01/20/2017 INDICATION: Ascites EXAM: ULTRASOUND GUIDED DIAGNOSTIC AND THERAPEUTIC PARACENTESIS MEDICATIONS: None. COMPLICATIONS: None immediate. PROCEDURE: Procedure, benefits, and risks of procedure were discussed with patient. Written informed consent for procedure was obtained. Time out protocol followed. Adequate collection of ascites localized by ultrasound in LEFT lower quadrant. Skin prepped and draped in usual sterile fashion. Skin and soft tissues anesthetized with 10 mL of 1% lidocaine. 5 [place] Yueh catheter placed into peritoneal cavity. 4 L of amber colored ascitic fluid aspirated by vacuum bottle suction. Procedure tolerated well by patient without immediate complication. Approximately 1 hr following the procedure, patient developed itching. Physical exam shows truncal hives. Vital signs stable, no respiratory symptoms per patient. Uncertain if patient is having a reaction to the chlorhexidine scrub, the 1% lidocaine local anesthesia, the IV albumin given at the time of the procedure, or some other cause. Patient treated with 50 mg Benadryl IV. FINDINGS: A total of approximately 4 L of ascitic fluid was removed. Samples were sent to the laboratory as requested by the clinical team. IMPRESSION: Successful ultrasound-guided paracentesis yielding 4 liters of peritoneal fluid. Itching and hives following procedure as above. Electronically Signed   By: Lavonia Dana M.D.   On: 01/20/2017 15:34    Assessment & Plan:   Zahi was seen today for hospitalization follow-up.  Diagnoses and all orders for this visit:  Controlled type 2 diabetes mellitus with diabetic nephropathy, with long-term current use of insulin ([place]) -     CMP14+EGFR -     CBC with Differential/Platelet  Cirrhosis of liver with ascites, unspecified hepatic cirrhosis type ([place]) -     CMP14+EGFR -     CBC with  Differential/Platelet    I have discontinued Mr. Clayburn ONE TOUCH ULTRA TEST and B-D UF III MINI PEN NEEDLES. I am also having him maintain his calcitRIOL, warfarin, pantoprazole, spironolactone, torsemide, and propranolol.  Allergies as of 01/24/2017      Reactions   Acyclovir And Related Hives   Metformin And Related Diarrhea   Amoxicillin Hives   Augmentin [amoxicillin-pot Clavulanate] Hives   Mavik [trandolapril] Other (See Comments)   Caused renal problems      Medication List       Accurate as of 01/24/17 11:59 PM. Always use your most recent med list.          calcitRIOL 0.5 MCG capsule Commonly known as:  ROCALTROL daily   pantoprazole 40 MG tablet Commonly known as:  PROTONIX Take 1 tablet (40 mg total) by mouth 2 (two) times daily before a meal.   propranolol 40 MG tablet Commonly known as:  INDERAL Take 1 tablet (40 mg total) by mouth 2 (two) times daily.   spironolactone 100 MG tablet Commonly known as:  ALDACTONE Take 1 tablet (100 mg total) by mouth daily with breakfast.   torsemide 20 MG tablet Commonly known as:  DEMADEX Take 3 tablets (60 mg total) by mouth 2 (two) times daily.   warfarin 10 MG tablet Commonly known as:  COUMADIN Take 5 mg by mouth See admin instructions. Take as directed by anticoagulation dose - 1/2 tablet daily except take no Warfarin on Monday or Friday        Follow-up:  Return in about 1 month (around 02/21/2017).  Claretta Fraise, M.D.

## 2017-01-25 ENCOUNTER — Other Ambulatory Visit: Payer: Self-pay | Admitting: Family Medicine

## 2017-01-25 LAB — CULTURE, BODY FLUID-BOTTLE: CULTURE: NO GROWTH

## 2017-01-27 ENCOUNTER — Telehealth: Payer: Self-pay

## 2017-01-27 ENCOUNTER — Other Ambulatory Visit: Payer: Self-pay

## 2017-01-27 DIAGNOSIS — I4891 Unspecified atrial fibrillation: Secondary | ICD-10-CM

## 2017-01-27 NOTE — Addendum Note (Signed)
Addended by: Barbarann Ehlers A on: 01/27/2017 09:01 AM   Modules accepted: Orders

## 2017-01-27 NOTE — Telephone Encounter (Signed)
Pt enrolled in afib clinic

## 2017-01-27 NOTE — Telephone Encounter (Signed)
Patient contacted regarding discharge from [facility] on 01/21/17.  Patient understands to follow up with provider Purcell Nails NP on 01/28/17 at 210 pm at [facility]. Patient understands discharge instructions? yes Patient understands medications and regiment? yes Patient understands to bring all medications to this visit? yes  Pt confirmed apt tomorrow at 210 pm

## 2017-01-27 NOTE — Telephone Encounter (Signed)
-----   Message from Orinda Kenner sent at 01/21/2017 11:07 AM EST ----- Regarding: TOC To be d/c'd 2/13/tg ##also needs referral to A-Fib clinic##  Thanks, Coralyn Mark

## 2017-01-28 ENCOUNTER — Ambulatory Visit (INDEPENDENT_AMBULATORY_CARE_PROVIDER_SITE_OTHER): Payer: Medicare Other | Admitting: Adult Health

## 2017-01-28 ENCOUNTER — Ambulatory Visit: Payer: Medicare Other | Admitting: Gastroenterology

## 2017-01-28 ENCOUNTER — Encounter: Payer: Self-pay | Admitting: Adult Health

## 2017-01-28 VITALS — BP 106/68 | HR 95 | Ht 73.0 in | Wt 241.0 lb

## 2017-01-28 DIAGNOSIS — I10 Essential (primary) hypertension: Secondary | ICD-10-CM

## 2017-01-28 DIAGNOSIS — I482 Chronic atrial fibrillation, unspecified: Secondary | ICD-10-CM

## 2017-01-28 DIAGNOSIS — I517 Cardiomegaly: Secondary | ICD-10-CM | POA: Diagnosis not present

## 2017-01-28 MED ORDER — WARFARIN SODIUM 10 MG PO TABS: 5.0000 mg | ORAL_TABLET | ORAL | 2 refills | Status: DC

## 2017-01-28 MED ORDER — PROPRANOLOL HCL 40 MG PO TABS: 40.0000 mg | ORAL_TABLET | Freq: Two times a day (BID) | ORAL | 11 refills | Status: DC

## 2017-01-28 MED ORDER — TORSEMIDE 20 MG PO TABS: 60.0000 mg | ORAL_TABLET | Freq: Two times a day (BID) | ORAL | 11 refills | Status: DC

## 2017-01-28 MED ORDER — SPIRONOLACTONE 100 MG PO TABS: 100.0000 mg | ORAL_TABLET | Freq: Every day | ORAL | 11 refills | Status: DC

## 2017-01-28 NOTE — Patient Instructions (Signed)
Your physician recommends that you schedule a follow-up appointment after seen by Dr. Haroldine Laws  Your physician recommends that you continue on your current medications as directed. Please refer to the Current Medication list given to you today.  If you need a refill on your cardiac medications before your next appointment, please call your pharmacy.  Thank you for choosing [facility]!

## 2017-01-28 NOTE — Progress Notes (Signed)
Name: Melvin Wang    DOB: 1941-11-04  Age: 76 y.o.  MR#: [id number]       PCP:  Claretta Fraise, MD      Insurance: Payor: [employer] MEDICARE / Plan: [facility] MEDICARE / Product Type: *No Product type* /   CC:   No chief complaint on file.   VS Vitals:   01/28/17 1402  BP: 106/68  Pulse: 95  SpO2: 90%  Weight: 241 lb (109.3 kg)  Height: 6\' 1"  (1.854 m)    Weights Current Weight  01/28/17 241 lb (109.3 kg)  01/24/17 265 lb (120.2 kg)  01/21/17 263 lb 10.7 oz (119.6 kg)    Blood Pressure  BP Readings from Last 3 Encounters:  01/28/17 106/68  01/24/17 101/62  01/21/17 111/68     Admit date:  (Not on file) Last encounter with RMR:  Visit date not found   Allergy Acyclovir and related; Metformin and related; Amoxicillin; Augmentin [amoxicillin-pot clavulanate]; and Mavik [trandolapril]  Current Outpatient Prescriptions  Medication Sig Dispense Refill  . calcitRIOL (ROCALTROL) 0.5 MCG capsule daily    . LANTUS SOLOSTAR 100 UNIT/ML Solostar Pen INJECT 40 UNITS INTO THE SKIN EVERY MORNING. 15 mL 2  . pantoprazole (PROTONIX) 40 MG tablet Take 1 tablet (40 mg total) by mouth 2 (two) times daily before a meal. 60 tablet 0  . propranolol (INDERAL) 40 MG tablet Take 1 tablet (40 mg total) by mouth 2 (two) times daily. 60 tablet 0  . spironolactone (ALDACTONE) 100 MG tablet Take 1 tablet (100 mg total) by mouth daily with breakfast. 30 tablet 0  . torsemide (DEMADEX) 20 MG tablet Take 3 tablets (60 mg total) by mouth 2 (two) times daily. 60 tablet 0  . warfarin (COUMADIN) 10 MG tablet Take 5 mg by mouth See admin instructions. Take as directed by anticoagulation dose - 1/2 tablet daily except take no Warfarin on Monday or Friday 90 tablet 1   No current facility-administered medications for this visit.     Discontinued Meds:   There are no discontinued medications.  Patient Active Problem List   Diagnosis Date Noted  . Right ventricular enlargement 01/21/2017  . Volume  overload 01/21/2017  . CKD (chronic kidney disease), stage II 01/21/2017  . Ascites   . Acute respiratory failure ([place]) 01/19/2017  . Cirrhosis of liver with ascites ([place]) 01/19/2017  . Anemia 01/19/2017  . Thrombocytopenia ([place]) 01/19/2017  . Hyperlipemia 06/18/2016  . Petechial rash 08/21/2015  . Gout 01/29/2015  . Permanent atrial fibrillation ([place]) 03/23/2013  . Abnormality of gait 03/18/2013  . Tremor, essential 03/18/2013  . Essential hypertension 03/18/2013  . Controlled type 2 diabetes mellitus with diabetic nephropathy ([place]) 03/18/2013    LABS    Component Value Date/Time   NA 140 01/24/2017 0846   NA 137 01/21/2017 0428   NA 135 01/20/2017 0420   NA 135 01/19/2017 1545   NA 139 09/23/2016 0901   NA 137 06/18/2016 0844   K 5.0 01/24/2017 0846   K 3.8 01/21/2017 0428   K 3.7 01/20/2017 0420   CL 93 (L) 01/24/2017 0846   CL 96 (L) 01/21/2017 0428   CL 96 (L) 01/20/2017 0420   CO2 29 01/24/2017 0846   CO2 33 (H) 01/21/2017 0428   CO2 30 01/20/2017 0420   GLUCOSE 56 (L) 01/24/2017 0846   GLUCOSE 92 01/21/2017 0428   GLUCOSE 48 (L) 01/20/2017 0420   GLUCOSE 88 01/19/2017 1545   BUN 44 (H) 01/24/2017 [id number]  BUN 46 (H) 01/21/2017 0428   BUN 48 (H) 01/20/2017 0420   BUN 50 (H) 01/19/2017 1545   BUN 49 (H) 09/23/2016 0901   BUN 54 (H) 06/18/2016 0844   CREATININE 1.12 01/24/2017 0846   CREATININE 1.16 01/21/2017 0428   CREATININE 1.06 01/20/2017 0420   CALCIUM 8.8 01/24/2017 0846   CALCIUM 8.7 (L) 01/21/2017 0428   CALCIUM 8.8 (L) 01/20/2017 0420   GFRNONAA 65 01/24/2017 0846   GFRNONAA >60 01/21/2017 0428   GFRNONAA >60 01/20/2017 0420   GFRAA 75 01/24/2017 0846   GFRAA >60 01/21/2017 0428   GFRAA >60 01/20/2017 0420   CMP     Component Value Date/Time   NA 140 01/24/2017 0846   K 5.0 01/24/2017 0846   CL 93 (L) 01/24/2017 0846   CO2 29 01/24/2017 0846   GLUCOSE 56 (L) 01/24/2017 0846   GLUCOSE 92 01/21/2017 0428   BUN 44 (H) 01/24/2017 0846    CREATININE 1.12 01/24/2017 0846   CALCIUM 8.8 01/24/2017 0846   PROT 7.1 01/24/2017 0846   ALBUMIN 3.5 01/24/2017 0846   AST 23 01/24/2017 0846   ALT 14 01/24/2017 0846   ALKPHOS 243 (H) 01/24/2017 0846   BILITOT 2.0 (H) 01/24/2017 0846   GFRNONAA 65 01/24/2017 0846   GFRAA 75 01/24/2017 0846       Component Value Date/Time   WBC 11.0 (H) 01/24/2017 0846   WBC 9.3 01/20/2017 0420   WBC 9.5 01/19/2017 1545   WBC 11.1 (H) 01/13/2017 0934   HGB 10.9 (L) 01/20/2017 0420   HGB 11.5 (L) 01/19/2017 1545   HGB 11.5 (L) 01/13/2017 0934   HGB 13.7 07/04/2008 0913   HGB 13.6 01/04/2008 1030   HGB 13.3 09/03/2007 0802   HCT 34.8 (L) 01/24/2017 0846   HCT 32.2 (L) 01/20/2017 0420   HCT 34.2 (L) 01/19/2017 1545   HCT 33.2 (L) 01/13/2017 0934   HCT 32.3 (L) 09/23/2016 0901   HCT 36.5 (L) 03/11/2016 0918   HCT 40.0 07/04/2008 0913   HCT 39.1 01/04/2008 1030   HCT 38.2 (L) 09/03/2007 0802   MCV 100 (H) 01/24/2017 0846   MCV 101.3 (H) 01/20/2017 0420   MCV 101.2 (H) 01/19/2017 1545   MCV 100.6 (H) 01/13/2017 0934   MCV 99 (H) 09/23/2016 0901   MCV 100 (H) 03/11/2016 0918   MCV 95.9 07/04/2008 0913   MCV 95.6 01/04/2008 1030   MCV 96.5 09/03/2007 0802    Lipid Panel     Component Value Date/Time   CHOL 77 (L) 09/23/2016 0901   TRIG 62 09/23/2016 0901   TRIG 190 (H) 01/27/2015 1055   HDL 32 (L) 09/23/2016 0901   HDL 38 (L) 01/27/2015 1055   CHOLHDL 2.4 09/23/2016 0901   LDLCALC 33 09/23/2016 0901    ABG No results found for: PHART, PCO2ART, PO2ART, HCO3, TCO2, ACIDBASEDEF, O2SAT   Lab Results  Component Value Date   TSH 1.630 12/07/2015   BNP (last 3 results) No results for input(s): BNP in the last 8760 hours.  ProBNP (last 3 results) No results for input(s): PROBNP in the last 8760 hours.  Cardiac Panel (last 3 results) No results for input(s): CKTOTAL, CKMB, TROPONINI, RELINDX in the last 72 hours.  Iron/TIBC/Ferritin/ %Sat    Component Value Date/Time   IRON  47 09/23/2016 0901   TIBC 284 09/23/2016 0901   FERRITIN 196 01/20/2017 1155   FERRITIN 422 (H) 09/23/2016 0901   IRONPCTSAT 17 09/23/2016 0901  EKG Orders placed or performed during the hospital encounter of 01/19/17  . ED EKG  . EKG 12-Lead  . EKG 12-Lead  . EKG     Prior Assessment and Plan Problem List as of 01/28/2017 Reviewed: 01/24/2017  8:45 AM by Claretta Fraise, MD     Cardiovascular and Mediastinum   Essential hypertension   Permanent atrial fibrillation ([place])   Right ventricular enlargement     Respiratory   Acute respiratory failure ([place])     Digestive   Cirrhosis of liver with ascites ([place])     Endocrine   Controlled type 2 diabetes mellitus with diabetic nephropathy (HCC)     Nervous and Auditory   Tremor, essential     Musculoskeletal and Integument   Petechial rash   Last Assessment & Plan 08/21/2015 Office Visit Written 08/21/2015  9:55 AM by Fransisca Kaufmann Dettinger, MD    Patient has a petechial rash and is currently on Coumadin. Coumadin is being managed by a clinical pharmacologist Tammy. His Coumadin levels have been stable at 2.8-2.9 for quite some time. He has not made any medication changes but it is elevated today at 3.7. This is likely the cause of his petechial rash but we will still check a CBC and platelets, a liver panel, renal function and a urinalysis.        Genitourinary   CKD (chronic kidney disease), stage II     Other   Anemia   Thrombocytopenia (HCC)   Abnormality of gait   Gout   Hyperlipemia   Volume overload   Ascites       Imaging: Dg Chest 2 View  Result Date: 01/19/2017 CLINICAL DATA:  Shortness of breath, dyspnea with exertion EXAM: CHEST  2 VIEW COMPARISON:  None. FINDINGS: Patchy left lower lobe opacity, suspicious for pneumonia, less likely atelectasis. Right lung is clear. No frank interstitial edema. Possible small left pleural effusion. No pneumothorax. Mild cardiomegaly. IMPRESSION: Patchy left lower lobe opacity,  suspicious for pneumonia, less likely atelectasis. Possible small left pleural effusion. Electronically Signed   By: Julian Hy M.D.   On: 01/19/2017 16:32   Ct Abdomen Pelvis W Contrast  Result Date: 01/13/2017 CLINICAL DATA:  Abdominal distension. EXAM: CT ABDOMEN AND PELVIS WITH CONTRAST TECHNIQUE: Multidetector CT imaging of the abdomen and pelvis was performed using the standard protocol following bolus administration of intravenous contrast. CONTRAST:  127mL ISOVUE-300 IOPAMIDOL (ISOVUE-300) INJECTION 61% COMPARISON:  11/14/2016 FINDINGS: Lower chest: Mild cardiomegaly. Diffuse coronary artery calcifications in the visualized right coronary artery. Bibasilar atelectasis. No effusions. Hepatobiliary: Mild enlargement of the left hepatic lobe and subtle nodularity to the liver surface suggests cirrhosis. No visible focal abnormality or biliary ductal dilatation. No visible gallstones. Pancreas: No focal abnormality or ductal dilatation. Spleen: No focal abnormality.  Normal size. Adrenals/Urinary Tract: Small cyst in the midpole of the right kidney, otherwise unremarkable kidneys. Adrenal glands and urinary bladder unremarkable. Again noted is delayed excretion of contrast from both kidneys, similar to prior study. Stomach/Bowel: Stomach, large and small bowel grossly unremarkable. Vascular/Lymphatic: Aortic and iliac calcifications. No aneurysm. No adenopathy. Reproductive: No visible focal abnormality. Other: Moderate to large volume ascites in the abdomen and pelvis, increased since prior study. No free air. Musculoskeletal: No acute bony abnormality or focal bone lesion. Degenerative disc disease in the lumbar spine. IMPRESSION: Findings suspicious for cirrhosis. Moderate to large volume ascites, increasing since prior study. Diffuse aortic atherosclerosis and coronary artery disease. Cardiomegaly. Electronically Signed   By: Rolm Baptise M.D.  On: 01/13/2017 10:43   US Paracentesis  Result  Date: 01/20/2017 INDICATION: Ascites EXAM: ULTRASOUND GUIDED DIAGNOSTIC AND THERAPEUTIC PARACENTESIS MEDICATIONS: None. COMPLICATIONS: None immediate. PROCEDURE: Procedure, benefits, and risks of procedure were discussed with patient. Written informed consent for procedure was obtained. Time out protocol followed. Adequate collection of ascites localized by ultrasound in LEFT lower quadrant. Skin prepped and draped in usual sterile fashion. Skin and soft tissues anesthetized with 10 mL of 1% lidocaine. 5 [place] Yueh catheter placed into peritoneal cavity. 4 L of amber colored ascitic fluid aspirated by vacuum bottle suction. Procedure tolerated well by patient without immediate complication. Approximately 1 hr following the procedure, patient developed itching. Physical exam shows truncal hives. Vital signs stable, no respiratory symptoms per patient. Uncertain if patient is having a reaction to the chlorhexidine scrub, the 1% lidocaine local anesthesia, the IV albumin given at the time of the procedure, or some other cause. Patient treated with 50 mg Benadryl IV. FINDINGS: A total of approximately 4 L of ascitic fluid was removed. Samples were sent to the laboratory as requested by the clinical team. IMPRESSION: Successful ultrasound-guided paracentesis yielding 4 liters of peritoneal fluid. Itching and hives following procedure as above. Electronically Signed   By: Lavonia Dana M.D.   On: 01/20/2017 15:34

## 2017-01-28 NOTE — Progress Notes (Signed)
Cardiology Office Note   Date:  01/28/2017   ID:  Melvin Wang, Melvin Wang 04-02-1941, MRN [id number]  PCP:  Claretta Fraise, MD  Cardiologist: Heron Sabins, NP   Chief Complaint  Patient presents with  . Congestive Heart Failure    RV Failure      History of Present Illness: Melvin Wang is a 76 y.o. male who presents for posthospitalization follow-up after consultation in the setting of right heart failure. He is from [place], [place] and had not been seen by cardiologist in over 20 years.   He has a history of diabetes, hypertension, hyperlipidemia, chronic kidney disease, essential tremor, and prior in atrial fib. 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 Echocardiogram revealed mild LVH with an EF of 55-6%, hypokinesis of the basal mid anterior myocardium, mild MR, severe LA and RA E, severe RVE with low normal systolic function, mild to moderate TR, large left pleural effusion, elevated CVP, mild PR with inability to estimate PASP  The patient was recommended to be continued on diuretic therapy, and placed on Demadex 60 mg twice a day, and spironolactone 100 mg daily. Consideration for right heart catheterization will be made once he continues to be stable and euvolemic. Propanolol was increased with improvement in heart rate control,. CHADS VASC score of 4, and continued on Coumadin therapy with INR monitoring by primary care. Follow-up BMET and magnesium will be ordered on this office visit. He is also to be referred to advanced heart failure clinic in [place].  Follow-up labs were completed on 01/24/2017.  Sodium 140, potassium 5.0, chloride 83, CO2 28, glucose 56, BUN 44, creatinine 1.12. CBC: Hemoglobin 11.5, hematocrit 34.8, white blood cells 11.0, platelets 144.  He comes today feeling very well. He continues to have issues with  abdominal ascites, but after paracentesis is feeling better. He feels a little more fluid has accumulated since discharge. His weight has been improving. Weight today 241 pounds. He wishes to increase his activity, begin using his stair climber again.  Past Medical History:  Diagnosis Date  . Abnormality of gait   . Cataract   . CKD (chronic kidney disease), stage III    renal insufficiency, sees nephrologist: [facility]  . Diabetes mellitus ([place])   . Essential tremor   . Foot ulcer, right ([place]) 03/2013  . Gout   . Hyperlipidemia   . Hypertension   . Permanent atrial fibrillation (HCC)    a. >20 years, prior DCCVs unsuccessful, INR followed by PCP.    Past Surgical History:  Procedure Laterality Date  . CATARACT EXTRACTION W/PHACO Left 04/19/2013   Procedure: CATARACT EXTRACTION PHACO AND INTRAOCULAR LENS PLACEMENT (IOC);  Surgeon: Tonny Branch, MD;  Location: [facility];  Service: Ophthalmology;  Laterality: Left;  CDE 29.30  . CATARACT EXTRACTION W/PHACO Right 07/15/2013   Procedure: CATARACT EXTRACTION PHACO AND INTRAOCULAR LENS PLACEMENT (IOC);  Surgeon: Tonny Branch, MD;  Location: [facility];  Service: Ophthalmology;  Laterality: Right;  CDE:12.84  . ESOPHAGOGASTRODUODENOSCOPY N/A 01/21/2017   Procedure: ESOPHAGOGASTRODUODENOSCOPY (EGD);  Surgeon: Danie Binder, MD;  Location: [facility] SUITE;  Service: Endoscopy;  Laterality: N/A;     Current Outpatient Prescriptions  Medication Sig Dispense Refill  . calcitRIOL (ROCALTROL) 0.5 MCG capsule daily    . LANTUS SOLOSTAR 100 UNIT/ML Solostar Pen INJECT 40 UNITS INTO THE SKIN EVERY MORNING. 15 mL 2  .  pantoprazole (PROTONIX) 40 MG tablet Take 1 tablet (40 mg total) by mouth 2 (two) times daily before a meal. 60 tablet 0  . propranolol (INDERAL) 40 MG tablet Take 1 tablet (40 mg total) by mouth 2 (two) times daily. 60 tablet 11  . spironolactone (ALDACTONE) 100 MG tablet Take 1 tablet (100 mg total) by mouth daily with breakfast. 30 tablet 11   . torsemide (DEMADEX) 20 MG tablet Take 3 tablets (60 mg total) by mouth 2 (two) times daily. 60 tablet 11  . warfarin (COUMADIN) 10 MG tablet Take 0.5 tablets (5 mg total) by mouth See admin instructions. Take as directed by anticoagulation dose - 1/2 tablet daily except take no Warfarin on Monday or Friday 90 tablet 2   No current facility-administered medications for this visit.     Allergies:   Acyclovir and related; Metformin and related; Amoxicillin; Augmentin [amoxicillin-pot clavulanate]; and Mavik [trandolapril]    Social History:  The patient  reports that he quit smoking about 32 years ago. His smoking use included Cigarettes. He has a 15.00 pack-year smoking history. He has never used smokeless tobacco. He reports that he does not drink alcohol or use drugs.   Family History:  The patient's family history includes Cancer in his paternal uncle; Colon cancer (age of onset: 62) in his father.    ROS: All other systems are reviewed and negative. Unless otherwise mentioned in H&P    PHYSICAL EXAM: VS:  BP 106/68   Pulse 95   Ht 6\' 1"  (1.854 m)   Wt 241 lb (109.3 kg)   SpO2 90%   BMI 31.80 kg/m  , BMI Body mass index is 31.8 kg/m. GEN: Well nourished, well developed, in no acute distress  HEENT: normal  Neck: no JVD, carotid bruits, or masses Cardiac: IRRR; no murmurs, rubs, or gallops,no edema  Respiratory:  clear to auscultation bilaterally, normal work of breathing GI: soft, nontender,  Mildly distended, + BS MS: no deformity or atrophy  Skin: warm and dry, no rash Neuro:  Strength and sensation are intact Psych: euthymic mood, full affect   Recent Labs: 01/20/2017: Hemoglobin 10.9 01/24/2017: ALT 14; BUN 44; Creatinine, Ser 1.12; Platelets 144; Potassium 5.0; Sodium 140    Lipid Panel    Component Value Date/Time   CHOL 77 (L) 09/23/2016 0901   TRIG 62 09/23/2016 0901   TRIG 190 (H) 01/27/2015 1055   HDL 32 (L) 09/23/2016 0901   HDL 38 (L) 01/27/2015 1055    CHOLHDL 2.4 09/23/2016 0901   LDLCALC 33 09/23/2016 0901      Wt Readings from Last 3 Encounters:  01/28/17 241 lb (109.3 kg)  01/24/17 265 lb (120.2 kg)  01/21/17 263 lb 10.7 oz (119.6 kg)      Other studies Reviewed: Echocardiogram: 01/20/2017 Left ventricle: The cavity size was normal. Wall thickness was   increased in a pattern of mild LVH. Systolic function was normal.   The estimated ejection fraction was in the range of 55% to 60%. - Regional wall motion abnormality: Hypokinesis of the basal-mid   anteroseptal myocardium. - Aortic valve: Valve area (VTI): 2.32 cm^2. Valve area (Vmax):   2.32 cm^2. Valve area (Vmean): 2.16 cm^2. - Mitral valve: There was mild regurgitation. - Left atrium: The atrium was severely dilated. - Right ventricle: The cavity size was severely dilated. TAPSE: 16   mm . - Right atrium: The atrium was severely dilated. - Atrial septum: No defect or patent foramen ovale was identified. -  Tricuspid valve: There was mild-moderate regurgitation. The   available color Doppler images of the TR may underestimate its   severity, especially in the setting of a flattened ventricular   septum in diastole and systolic hepatic flow reversal which would   suggest more severe TR. - Pulmonary arteries: Systolic pressure could not be accurately   estimated. Inadequate TR jet, available Doppler likely   underestimates. - Inferior vena cava: The vessel was dilated. The respirophasic   diameter changes were blunted (< 50%), consistent with elevated   central venous pressure. - Pericardium, extracardiac: There is a large left pleural   effusion. - Technically difficult study. Echocontrast was used to enhance   visualization.  ASSESSMENT AND PLAN:  1.  Right heart failure: Per echocardiogram 11/09/2017, revealed severe RV dilatation. The patient continues on torsemide 60 mg twice a day, and spironolactone. Potassium 5.0 on recent labs. Weight has been maintained.  After discussion with Dr. Harl Bowie, on site, the patient will be referred to the Advanced Heart Failure clinic, Dr. Haroldine Laws, I provided them with directions to his office at [facility], along with a thorough and phone number from our [facility] website. The patient will follow-up with Dr.  Harl Bowie after being seen by the advanced heart failure clinic.  2. Chronic atrial fibrillation: Patient continues on propanolol with good heart rate control. He continues on Coumadin therapy and is followed by primary care for ongoing dosing and management. No evidence of bleeding or significant bruising. There are some old ecchymosis noted from recent IV insertion and blood draws.  3. Cirrhosis of the Liver ([facility]): Followed by GI with appointment scheduled later this month on 02/03/2017. Scheduled paracentesis per GI.  4. Chronic kidney disease type II: Followed by primary care. May need referral to nephrology.  Current medicines are reviewed at length with the patient today.    Labs/ tests ordered today include:   Orders Placed This Encounter  Procedures  . AMB referral to CHF clinic     Disposition:   FU with Dr. Harl Bowie after being seen in the heart failure clinic. Patient will likely undergo right heart cath. We'll await these findings prior to scheduling of appointment  Signed, Jory Sims, NP  01/28/2017 4:53 PM    [place] [address], [place], [place] [address] Phone: [phone]; Fax: [phone]

## 2017-01-31 ENCOUNTER — Other Ambulatory Visit: Payer: Self-pay | Admitting: Family Medicine

## 2017-02-03 ENCOUNTER — Encounter: Payer: Self-pay | Admitting: Gastroenterology

## 2017-02-03 ENCOUNTER — Ambulatory Visit (INDEPENDENT_AMBULATORY_CARE_PROVIDER_SITE_OTHER): Payer: Medicare Other | Admitting: Gastroenterology

## 2017-02-03 VITALS — BP 113/59 | HR 96 | Temp 97.5°F | Ht 73.0 in | Wt 239.4 lb

## 2017-02-03 DIAGNOSIS — K7460 Unspecified cirrhosis of liver: Secondary | ICD-10-CM | POA: Diagnosis not present

## 2017-02-03 MED ORDER — TORSEMIDE 20 MG PO TABS: 60.0000 mg | ORAL_TABLET | Freq: Two times a day (BID) | ORAL | 3 refills | Status: DC

## 2017-02-03 NOTE — Patient Instructions (Signed)
Please have Hep A/B vaccinations completed with Dr. Livia Snellen. I gave a prescription to you today.    We will see you in 6 months. We can talk about colonoscopy then.  We will also order an ultrasound in 6 months of your liver.   Call if any changes at all between now and then.

## 2017-02-03 NOTE — Progress Notes (Signed)
Referring Provider: Claretta Fraise, MD Primary Care Physician:  Claretta Fraise, MD Primary GI: Dr. Oneida Alar   Chief Complaint  Patient presents with  . Cirrhosis    seen in ER 01/13/17 and 01/19/17    HPI:   Melvin Wang is a 76 y.o. male presenting today with newly diagnosed cirrhosis. Hospitalized in February 2018 with new onset ascites. 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 Multifactorial. Underwent EGD for variceal screening while inpatient. 4 liters removed during hospitalization.    Torsemide 60 mg BID. Aldactone 100 mg once daily. Aug 2018 US abdomen due. Doing well, feels like energy is returning. No lower extremity edema. Less abdominal swelling and feels like "it goes down every day". No abdominal pain, N/V. No issues with mental status changes, confusion. Wife present.    Past Medical History:  Diagnosis Date  . Abnormality of gait   . Cataract   . CKD (chronic kidney disease), stage III    renal insufficiency, sees nephrologist: [facility]  . Diabetes mellitus ([place])   . Essential tremor   . Foot ulcer, right ([place]) 03/2013  . Gout   . Hyperlipidemia   . Hypertension   . Permanent atrial fibrillation (HCC)    a. >20 years, prior DCCVs unsuccessful, INR followed by PCP.    Past Surgical History:  Procedure Laterality Date  . CATARACT EXTRACTION W/PHACO Left 04/19/2013   Procedure: CATARACT EXTRACTION PHACO AND INTRAOCULAR LENS PLACEMENT (IOC);  Surgeon: Tonny Branch, MD;  Location: [facility];  Service: Ophthalmology;  Laterality: Left;  CDE 29.30  . CATARACT EXTRACTION W/PHACO Right 07/15/2013   Procedure: CATARACT EXTRACTION PHACO AND INTRAOCULAR LENS PLACEMENT (IOC);  Surgeon: Tonny Branch, MD;  Location: [facility];  Service: Ophthalmology;  Laterality: Right;  CDE:12.84  . ESOPHAGOGASTRODUODENOSCOPY N/A 01/21/2017   Dr. Oneida Alar: mild erosive gastritis and moderate erosive duodenitis (negative H.pylori). Multiple gastric  polyps.     Current Outpatient Prescriptions  Medication Sig Dispense Refill  . allopurinol (ZYLOPRIM) 300 MG tablet TAKE 1 TABLET (300 MG TOTAL) BY MOUTH DAILY. 90 tablet 0  . calcitRIOL (ROCALTROL) 0.5 MCG capsule daily    . LANTUS SOLOSTAR 100 UNIT/ML Solostar Pen INJECT 40 UNITS INTO THE SKIN EVERY MORNING. 15 mL 2  . pantoprazole (PROTONIX) 40 MG tablet Take 1 tablet (40 mg total) by mouth 2 (two) times daily before a meal. 60 tablet 0  . propranolol (INDERAL) 40 MG tablet Take 1 tablet (40 mg total) by mouth 2 (two) times daily. 60 tablet 11  . spironolactone (ALDACTONE) 100 MG tablet Take 1 tablet (100 mg total) by mouth daily with breakfast. 30 tablet 11  . torsemide (DEMADEX) 20 MG tablet Take 3 tablets (60 mg total) by mouth 2 (two) times daily. 180 tablet 3  . warfarin (COUMADIN) 10 MG tablet Take 0.5 tablets (5 mg total) by mouth See admin instructions. Take as directed by anticoagulation dose - 1/2 tablet daily except take no Warfarin on Monday or Friday 90 tablet 2   No current facility-administered medications for this visit.     Allergies as of 02/03/2017 - Review Complete 02/03/2017  Allergen Reaction Noted  . Acyclovir and related Hives 03/18/2013  . Metformin and related Diarrhea 03/13/2015  . Amoxicillin Hives 03/18/2013  . Augmentin [amoxicillin-pot clavulanate] Hives 03/18/2013  . Mavik [trandolapril] Other (See Comments) 04/13/2013    Family History  Problem Relation Age of Onset  . Colon cancer Father 82    Deceased  age 31   . Cancer Paternal Uncle   . Liver disease Neg Hx     Social History   Social History  . Marital status: Married    Spouse name: N/A  . Number of children: N/A  . Years of education: N/A   Occupational History  . retired Dealer    Social History Main Topics  . Smoking status: Former Smoker    Packs/day: 1.00    Years: 15.00    Types: Cigarettes    Quit date: 07/07/1984  . Smokeless tobacco: Never Used     Comment: Smoked  20 years  . Alcohol use No     Comment: HISTORY OF HEAVY ETOH USE in 1966-1968, otherwise none   . Drug use: No  . Sexual activity: Yes    Birth control/ protection: None   Other Topics Concern  . None   Social History Narrative  . None    Review of Systems: As mentioned in HPI   Physical Exam: BP (!) 113/59   Pulse 96   Temp 97.5 F (36.4 C) (Oral)   Ht 6\' 1"  (1.854 m)   Wt 239 lb 6.4 oz (108.6 kg)   BMI 31.59 kg/m  General:   Alert and oriented. No distress noted. Pleasant and cooperative.  Head:  Normocephalic and atraumatic. Eyes:  Conjuctiva clear without scleral icterus. Abdomen:  +BS, soft, +fluid wave but non-tense. No rebound or guarding. No HSM or masses noted. Msk:  Symmetrical without gross deformities. Normal posture. Extremities:  Without edema. Neurologic:  Alert and  oriented x4 Psych:  Alert and cooperative. Normal mood and affect.   Lab Results  Component Value Date   ALT 14 01/24/2017   AST 23 01/24/2017   ALKPHOS 243 (H) 01/24/2017   BILITOT 2.0 (H) 01/24/2017   Lab Results  Component Value Date   WBC 11.0 (H) 01/24/2017   HGB 10.9 (L) 01/20/2017   HCT 34.8 (L) 01/24/2017   MCV 100 (H) 01/24/2017   PLT 144 (L) 01/24/2017   Lab Results  Component Value Date   IRON 47 09/23/2016   TIBC 284 09/23/2016   FERRITIN 196 01/20/2017

## 2017-02-05 ENCOUNTER — Encounter: Payer: Self-pay | Admitting: Gastroenterology

## 2017-02-05 NOTE — Progress Notes (Signed)
CC'ED TO PCP 

## 2017-02-05 NOTE — Assessment & Plan Note (Signed)
76 year old male with cirrhosis, likely etiology NASH/heart failure as contributors. Doing well now since hospitalization with improved/resolved lower extremity edema, no evidence of tense ascites, and has seen cardiology. Continue current diuretic dosing, US abdomen in Aug 2018, return in 6 months, complete Hep A/B vaccinations. Discussed potential screening colonoscopy in near future, but he would like to wait until he has recovered from this hospitalization. We will discuss this when he returns at next visit.

## 2017-02-10 ENCOUNTER — Other Ambulatory Visit: Payer: Self-pay | Admitting: Family Medicine

## 2017-02-14 ENCOUNTER — Ambulatory Visit (INDEPENDENT_AMBULATORY_CARE_PROVIDER_SITE_OTHER): Payer: Medicare Other | Admitting: Nurse Practitioner

## 2017-02-14 ENCOUNTER — Encounter: Payer: Self-pay | Admitting: Nurse Practitioner

## 2017-02-14 VITALS — BP 103/70 | HR 49 | Temp 96.6°F | Ht 73.0 in | Wt 220.0 lb

## 2017-02-14 DIAGNOSIS — G4710 Hypersomnia, unspecified: Secondary | ICD-10-CM

## 2017-02-14 NOTE — Progress Notes (Signed)
   Subjective:    Patient ID: Melvin Wang, male    DOB: 1941/10/13, 76 y.o.   MRN: [id number]  HPI  Patient brought in by his wife today c/o feeling sleepy all the time. He has cirrhosis of the liver and breathing was labored so he was admitted to hospital. His wife said over the last few days he has been sleepy all the time. Only recent change is lasix was changed to torsemide.   Review of Systems  Constitutional: Negative.   HENT: Negative.   Respiratory: Negative.   Cardiovascular: Negative.   Gastrointestinal: Negative.   Genitourinary: Negative.   Neurological: Negative.   Psychiatric/Behavioral: Negative.   All other systems reviewed and are negative.      Objective:   Physical Exam  Constitutional: He is oriented to person, place, and time. He appears well-developed and well-nourished. No distress.  Cardiovascular: Normal rate and regular rhythm.   Pulmonary/Chest: Effort normal and breath sounds normal.  Abdominal: Soft. Bowel sounds are normal. He exhibits distension. There is no tenderness.  Neurological: He is alert and oriented to person, place, and time.  Skin: Skin is warm.  Psychiatric: He has a normal mood and affect. His behavior is normal. Judgment and thought content normal.   BP 103/70   Pulse (!) 49   Temp (!) 96.6 F (35.9 C) (Oral)   Ht '6\' 1"'$  (1.854 m)   Wt 220 lb (99.8 kg)   BMI 29.03 kg/m      Assessment & Plan:  1. Sleeping excessive Continue all meds Labs pending Will call with lab results No driving while having sleepiness - Ammonia - CMP14+EGFR * Consulted with DR. [place], FNP

## 2017-02-14 NOTE — Patient Instructions (Signed)
Ammonia Test Why am I having this test? Ammonia testing is used to help diagnose and monitor severe liver diseases. It is also used to diagnose and monitor a brain disorder that can develop in individuals who have liver disease (hepatic encephalopathy). Ammonia levels can rise when the liver and kidneys are not working well enough to get rid of urea. A buildup of ammonia in the body can cause mental and neurological changes that can lead to confusion, disorientation, sleepiness, and eventually coma and even death. Infants and children with increased ammonia levels may vomit often, be irritable, and be increasingly lethargic. Without treatment they may experience seizures and breathing difficulty and go into a coma and die. What kind of sample is taken? A blood sample is needed for this test. It is usually collected by inserting a needle into a vein. How do I prepare for this test? Follow instructions from your health care provider or your child's health care provider about avoiding these before the test:  Exercise.  Smoking cigarettes.  Certain medicines. What are the reference ranges? Reference ranges are considered healthy ranges established after testing a large group of healthy people. Reference ranges may vary among different people, labs, and hospitals. It is your responsibility to obtain the test results. Ask the lab or department performing the test when and how you will get the results. What do the results mean? Increased levels of ammonia may mean that you have or your child has:  Liver disease.  Gastrointestinal (GI) bleeding or GI obstruction.  Severe heart failure.  Hemolytic disease of newborn.  Hepatic encephalopathy.  A genetic metabolic disorder. Decreased levels of ammonia may mean that you have or your child has:  High blood pressure (hypertension).  A genetic metabolic syndrome. Talk with your health care provider to discuss the results, treatment options, and  if necessary, the need for more tests. Talk with your health care provider if you have any questions about your results. Talk with your health care provider to discuss your results, treatment options, and if necessary, the need for more tests. Talk with your health care provider if you have any questions about your results. This information is not intended to replace advice given to you by your health care provider. Make sure you discuss any questions you have with your health care provider. Document Released: 12/17/2004 Document Revised: 07/29/2016 Document Reviewed: 04/26/2014 Elsevier Interactive Patient Education  2017 [employer].

## 2017-02-15 LAB — CMP14+EGFR
A/G RATIO: 0.9 — AB (ref 1.2–2.2)
ALT: 15 IU/L (ref 0–44)
AST: 23 IU/L (ref 0–40)
Albumin: 4.4 g/dL (ref 3.5–4.8)
Alkaline Phosphatase: 305 IU/L — ABNORMAL HIGH (ref 39–117)
BUN / CREAT RATIO: 42 — AB (ref 10–24)
BUN: 86 mg/dL (ref 8–27)
Bilirubin Total: 2.3 mg/dL — ABNORMAL HIGH (ref 0.0–1.2)
CALCIUM: 10.3 mg/dL — AB (ref 8.6–10.2)
CO2: 28 mmol/L (ref 18–29)
Chloride: 86 mmol/L — ABNORMAL LOW (ref 96–106)
Creatinine, Ser: 2.06 mg/dL — ABNORMAL HIGH (ref 0.76–1.27)
GFR, EST AFRICAN AMERICAN: 36 mL/min/{1.73_m2} — AB (ref >59)
GFR, EST NON AFRICAN AMERICAN: 31 mL/min/{1.73_m2} — AB (ref >59)
GLOBULIN, TOTAL: 5.1 g/dL — AB (ref 1.5–4.5)
Glucose: 92 mg/dL (ref 65–99)
POTASSIUM: 6.2 mmol/L — AB (ref 3.5–5.2)
SODIUM: 134 mmol/L (ref 134–144)
TOTAL PROTEIN: 9.5 g/dL — AB (ref 6.0–8.5)

## 2017-02-15 LAB — AMMONIA: Ammonia: 218 ug/dL (ref 27–102)

## 2017-02-17 ENCOUNTER — Other Ambulatory Visit: Payer: Self-pay | Admitting: Nurse Practitioner

## 2017-02-17 DIAGNOSIS — K7460 Unspecified cirrhosis of liver: Secondary | ICD-10-CM

## 2017-02-17 MED ORDER — LACTULOSE 10 GM/15ML PO SOLN: 20.0000 g | Freq: Two times a day (BID) | ORAL | 3 refills | Status: DC

## 2017-02-18 ENCOUNTER — Other Ambulatory Visit: Payer: Medicare Other

## 2017-02-18 ENCOUNTER — Other Ambulatory Visit: Payer: Self-pay | Admitting: Family Medicine

## 2017-02-18 DIAGNOSIS — M1A09X0 Idiopathic chronic gout, multiple sites, without tophus (tophi): Secondary | ICD-10-CM

## 2017-02-18 DIAGNOSIS — K7460 Unspecified cirrhosis of liver: Secondary | ICD-10-CM | POA: Diagnosis not present

## 2017-02-18 DIAGNOSIS — I10 Essential (primary) hypertension: Secondary | ICD-10-CM

## 2017-02-18 DIAGNOSIS — I482 Chronic atrial fibrillation: Secondary | ICD-10-CM | POA: Diagnosis not present

## 2017-02-18 DIAGNOSIS — Z794 Long term (current) use of insulin: Secondary | ICD-10-CM

## 2017-02-18 DIAGNOSIS — D696 Thrombocytopenia, unspecified: Secondary | ICD-10-CM | POA: Diagnosis not present

## 2017-02-18 DIAGNOSIS — E7849 Other hyperlipidemia: Secondary | ICD-10-CM

## 2017-02-18 DIAGNOSIS — I4821 Permanent atrial fibrillation: Secondary | ICD-10-CM

## 2017-02-18 DIAGNOSIS — R188 Other ascites: Secondary | ICD-10-CM

## 2017-02-18 DIAGNOSIS — E1121 Type 2 diabetes mellitus with diabetic nephropathy: Secondary | ICD-10-CM | POA: Diagnosis not present

## 2017-02-18 LAB — BAYER DCA HB A1C WAIVED: HB A1C: 5.8 % (ref <7.0)

## 2017-02-19 LAB — CMP14+EGFR
A/G RATIO: 0.9 — AB (ref 1.2–2.2)
ALT: 21 IU/L (ref 0–44)
AST: 27 IU/L (ref 0–40)
Albumin: 4.3 g/dL (ref 3.5–4.8)
Alkaline Phosphatase: 295 IU/L — ABNORMAL HIGH (ref 39–117)
BUN/Creatinine Ratio: 42 — ABNORMAL HIGH (ref 10–24)
BUN: 92 mg/dL (ref 8–27)
Bilirubin Total: 1.8 mg/dL — ABNORMAL HIGH (ref 0.0–1.2)
CALCIUM: 10.5 mg/dL — AB (ref 8.6–10.2)
CHLORIDE: 84 mmol/L — AB (ref 96–106)
CO2: 27 mmol/L (ref 18–29)
Creatinine, Ser: 2.2 mg/dL — ABNORMAL HIGH (ref 0.76–1.27)
GFR calc Af Amer: 33 mL/min/{1.73_m2} — ABNORMAL LOW (ref >59)
GFR, EST NON AFRICAN AMERICAN: 29 mL/min/{1.73_m2} — AB (ref >59)
GLOBULIN, TOTAL: 4.7 g/dL — AB (ref 1.5–4.5)
Glucose: 113 mg/dL — ABNORMAL HIGH (ref 65–99)
POTASSIUM: 6.1 mmol/L — AB (ref 3.5–5.2)
SODIUM: 131 mmol/L — AB (ref 134–144)
Total Protein: 9 g/dL — ABNORMAL HIGH (ref 6.0–8.5)

## 2017-02-19 LAB — CBC WITH DIFFERENTIAL/PLATELET
BASOS ABS: 0 10*3/uL (ref 0.0–0.2)
BASOS: 0 %
EOS (ABSOLUTE): 0.2 10*3/uL (ref 0.0–0.4)
Eos: 2 %
Hematocrit: 41.3 % (ref 37.5–51.0)
Hemoglobin: 13.6 g/dL (ref 13.0–17.7)
IMMATURE GRANS (ABS): 0.1 10*3/uL (ref 0.0–0.1)
IMMATURE GRANULOCYTES: 1 %
Lymphocytes Absolute: 1.4 10*3/uL (ref 0.7–3.1)
Lymphs: 13 %
MCH: 34 pg — ABNORMAL HIGH (ref 26.6–33.0)
MCHC: 32.9 g/dL (ref 31.5–35.7)
MCV: 103 fL — ABNORMAL HIGH (ref 79–97)
Monocytes Absolute: 1.6 10*3/uL — ABNORMAL HIGH (ref 0.1–0.9)
Monocytes: 15 %
Neutrophils Absolute: 7.3 10*3/uL — ABNORMAL HIGH (ref 1.4–7.0)
Neutrophils: 69 %
Platelets: 169 10*3/uL (ref 150–379)
RBC: 4 x10E6/uL — AB (ref 4.14–5.80)
RDW: 16.9 % — AB (ref 12.3–15.4)
WBC: 10.5 10*3/uL (ref 3.4–10.8)

## 2017-02-19 LAB — LIPID PANEL
Chol/HDL Ratio: 2.9 ratio units (ref 0.0–5.0)
Cholesterol, Total: 115 mg/dL (ref 100–199)
HDL: 40 mg/dL (ref >39)
LDL Calculated: 61 mg/dL (ref 0–99)
Triglycerides: 72 mg/dL (ref 0–149)
VLDL Cholesterol Cal: 14 mg/dL (ref 5–40)

## 2017-02-19 LAB — URIC ACID: URIC ACID: 10 mg/dL — AB (ref 3.7–8.6)

## 2017-02-19 LAB — AMMONIA: Ammonia: 224 ug/dL (ref 27–102)

## 2017-02-21 ENCOUNTER — Encounter: Payer: Self-pay | Admitting: Family Medicine

## 2017-02-21 ENCOUNTER — Ambulatory Visit (INDEPENDENT_AMBULATORY_CARE_PROVIDER_SITE_OTHER): Payer: Medicare Other | Admitting: Family Medicine

## 2017-02-21 VITALS — BP 84/58 | HR 50 | Ht 73.0 in | Wt 219.0 lb

## 2017-02-21 DIAGNOSIS — E1121 Type 2 diabetes mellitus with diabetic nephropathy: Secondary | ICD-10-CM

## 2017-02-21 DIAGNOSIS — Z23 Encounter for immunization: Secondary | ICD-10-CM

## 2017-02-21 DIAGNOSIS — K7460 Unspecified cirrhosis of liver: Secondary | ICD-10-CM

## 2017-02-21 DIAGNOSIS — R188 Other ascites: Secondary | ICD-10-CM

## 2017-02-21 DIAGNOSIS — I482 Chronic atrial fibrillation: Secondary | ICD-10-CM

## 2017-02-21 DIAGNOSIS — I4821 Permanent atrial fibrillation: Secondary | ICD-10-CM

## 2017-02-21 DIAGNOSIS — Z794 Long term (current) use of insulin: Secondary | ICD-10-CM

## 2017-02-21 LAB — COAGUCHEK XS/INR WAIVED
INR: 2 — AB (ref 0.9–1.1)
Prothrombin Time: 24 s

## 2017-02-21 MED ORDER — SPIRONOLACTONE 50 MG PO TABS: 50.0000 mg | ORAL_TABLET | Freq: Every day | ORAL | 5 refills | Status: DC

## 2017-02-21 MED ORDER — TORSEMIDE 20 MG PO TABS: ORAL_TABLET | ORAL | 3 refills | Status: DC

## 2017-02-21 MED ORDER — FEBUXOSTAT 40 MG PO TABS: 40.0000 mg | ORAL_TABLET | Freq: Every day | ORAL | 5 refills | Status: AC

## 2017-02-21 MED ORDER — LACTULOSE 10 GM/15ML PO SOLN: 20.0000 g | Freq: Two times a day (BID) | ORAL | 5 refills | Status: DC

## 2017-02-21 NOTE — Progress Notes (Signed)
Subjective:  Patient ID: Melvin Wang, male    DOB: 1941-05-25  Age: 76 y.o. MRN: [id number]  CC: Diabetes (pt here today for routine follow up on his Diabetes and he also needs Hep A and Hep B vaccines per the GI doctor.)   HPI Melvin Wang presents forFollow-up of diabetes. And cirrhosis with ascites. He was in for blood work earlier this week and noted to have an elevated creatinine and an elevated potassium. His A1c was noted to be 5.8.   Patient checks blood sugar at home.  Lab not returned. Patient says they are looking good and in normal range. Patient denies symptoms such as polyuria, polydipsia, excessive hunger, nausea No significant hypoglycemic spells noted. Medications reviewed. Pt reports taking them regularly without complication/adverse reaction being reported today.  With regard to the cirrhosis. He has lost a good deal of weight. His abdomen is much less rotund than it has been. He is much less short of breath. He does continue to feel quite weak.   Patient in for follow-up of atrial fibrillation. Patient denies any recent bouts of chest pain or palpitations. Additionally, patient is taking anticoagulants. Patient denies any recent excessive bleeding episodes including epistaxis, bleeding from the gums, genitalia, rectal bleeding or hematuria. Additionally there has been no excessive bruising.  History Melvin Wang has a past medical history of Abnormality of gait; Cataract; CKD (chronic kidney disease), stage III; Diabetes mellitus ([place]); Essential tremor; Foot ulcer, right ([place]) (03/2013); Gout; Hyperlipidemia; Hypertension; and Permanent atrial fibrillation ([place]).   He has a past surgical history that includes Cataract extraction w/PHACO (Left, 04/19/2013); Cataract extraction w/PHACO (Right, 07/15/2013); and Esophagogastroduodenoscopy (N/A, 01/21/2017).   His family history includes Cancer in his paternal uncle; Colon cancer (age of onset: 43) in his father.He  reports that he quit smoking about 32 years ago. His smoking use included Cigarettes. He has a 15.00 pack-year smoking history. He has never used smokeless tobacco. He reports that he does not drink alcohol or use drugs.  Current Outpatient Prescriptions on File Prior to Visit  Medication Sig Dispense Refill  . calcitRIOL (ROCALTROL) 0.5 MCG capsule daily    . LANTUS SOLOSTAR 100 UNIT/ML Solostar Pen INJECT 40 UNITS INTO THE SKIN EVERY MORNING. 15 mL 2  . pantoprazole (PROTONIX) 40 MG tablet Take 1 tablet (40 mg total) by mouth 2 (two) times daily before a meal. 60 tablet 0  . propranolol (INDERAL) 40 MG tablet Take 1 tablet (40 mg total) by mouth 2 (two) times daily. 60 tablet 11  . warfarin (COUMADIN) 10 MG tablet Take 0.5 tablets (5 mg total) by mouth See admin instructions. Take as directed by anticoagulation dose - 1/2 tablet daily except take no Warfarin on Monday or Friday 90 tablet 2  . warfarin (COUMADIN) 10 MG tablet TAKE 1/2 TABLET DAILY ON MON,WED, AND FRIDAY AND ON TUES, THUR , SAT, AND SUN TAKE 1 TABLET DAILY 90 tablet 0   No current facility-administered medications on file prior to visit.     ROS Review of Systems  Constitutional: Negative for chills, diaphoresis and fever.  HENT: Negative for rhinorrhea and sore throat.   Respiratory: Positive for shortness of breath (this has improved). Negative for cough.   Cardiovascular: Negative for chest pain.  Gastrointestinal: Positive for abdominal distention (this is much less than it was at previous evaluations) and abdominal pain.  Musculoskeletal: Positive for myalgias. Negative for arthralgias.  Skin: Negative for rash.  Neurological: Negative for weakness and headaches.  Objective:  BP (!) 84/58   Pulse (!) 50   Ht 6\' 1"  (1.854 m)   Wt 219 lb (99.3 kg)   BMI 28.89 kg/m   BP Readings from Last 3 Encounters:  02/21/17 (!) 84/58  02/14/17 103/70  02/03/17 (!) 113/59    Wt Readings from Last 3 Encounters:    02/21/17 219 lb (99.3 kg)  02/14/17 220 lb (99.8 kg)  02/03/17 239 lb 6.4 oz (108.6 kg)     Physical Exam  Constitutional: He is oriented to person, place, and time. He appears well-developed. No distress.  Face looks a bit pinched and color is slightly icteric particularly in the sclera  HENT:  Head: Normocephalic and atraumatic.  Right Ear: External ear normal.  Left Ear: External ear normal.  Nose: Nose normal.  Mouth/Throat: Oropharynx is clear and moist.  Eyes: Conjunctivae and EOM are normal. Pupils are equal, round, and reactive to light. Scleral icterus is present.  Neck: Normal range of motion. Neck supple. No thyromegaly present.  Cardiovascular: Normal rate, regular rhythm and normal heart sounds.   No murmur heard. Pulmonary/Chest: Effort normal and breath sounds normal. No respiratory distress. He has no wheezes. He has no rales.  Abdominal: Soft. Bowel sounds are normal. He exhibits distension (without shifting dullness). There is no tenderness.  Musculoskeletal: Normal range of motion.  Lymphadenopathy:    He has no cervical adenopathy.  Neurological: He is alert and oriented to person, place, and time. He has normal reflexes.  Skin: Skin is warm and dry.  Psychiatric: He has a normal mood and affect. His behavior is normal. Judgment and thought content normal.    No components found for: BAYER   Results for orders placed or performed in visit on 02/21/17  CoaguChek XS/INR Waived  Result Value Ref Range   INR 2.0 (H) 0.9 - 1.1   Prothrombin Time 24.0 sec     Assessment & Plan:   Melvin Wang was seen today for diabetes.  Diagnoses and all orders for this visit:  Controlled type 2 diabetes mellitus with diabetic nephropathy, with long-term current use of insulin (HCC)  Permanent atrial fibrillation ([place]) -     CoaguChek XS/INR Waived  Cirrhosis of liver with ascites, unspecified hepatic cirrhosis type ([place])  Cirrhosis of liver without ascites,  unspecified hepatic cirrhosis type (HCC) -     lactulose (CHRONULAC) 10 GM/15ML solution; Take 30 mLs (20 g total) by mouth 2 (two) times daily.  Other orders -     spironolactone (ALDACTONE) 50 MG tablet; Take 1 tablet (50 mg total) by mouth daily with breakfast. -     torsemide (DEMADEX) 20 MG tablet; 2 each morning and one in afternoon -     febuxostat (ULORIC) 40 MG tablet; Take 1 tablet (40 mg total) by mouth daily. -     Hepatitis A vaccine adult IM      I have discontinued Melvin Wang allopurinol. I have also changed his spironolactone and torsemide. Additionally, I am having him start on febuxostat. Lastly, I am having him maintain his calcitRIOL, pantoprazole, LANTUS SOLOSTAR, propranolol, warfarin, warfarin, and lactulose.  Meds ordered this encounter  Medications  . lactulose (CHRONULAC) 10 GM/15ML solution    Sig: Take 30 mLs (20 g total) by mouth 2 (two) times daily.    Dispense:  1892 mL    Refill:  5  . spironolactone (ALDACTONE) 50 MG tablet    Sig: Take 1 tablet (50 mg total) by mouth daily with breakfast.  Dispense:  30 tablet    Refill:  5  . torsemide (DEMADEX) 20 MG tablet    Sig: 2 each morning and one in afternoon    Dispense:  90 tablet    Refill:  3  . febuxostat (ULORIC) 40 MG tablet    Sig: Take 1 tablet (40 mg total) by mouth daily.    Dispense:  30 tablet    Refill:  5     Follow-up: Return in about 1 month (around 03/24/2017).  Claretta Fraise, M.D.

## 2017-02-25 ENCOUNTER — Emergency Department (HOSPITAL_COMMUNITY): Payer: Medicare Other

## 2017-02-25 ENCOUNTER — Inpatient Hospital Stay (HOSPITAL_COMMUNITY)
Admission: EM | Admit: 2017-02-25 | Discharge: 2017-03-07 | DRG: 637 | Disposition: A | Discharge status: Home Health | Payer: Medicare Other | Attending: Internal Medicine | Admitting: Internal Medicine

## 2017-02-25 ENCOUNTER — Encounter (HOSPITAL_COMMUNITY): Payer: Self-pay

## 2017-02-25 DIAGNOSIS — I200 Unstable angina: Secondary | ICD-10-CM | POA: Diagnosis not present

## 2017-02-25 DIAGNOSIS — R634 Abnormal weight loss: Secondary | ICD-10-CM | POA: Diagnosis present

## 2017-02-25 DIAGNOSIS — E11649 Type 2 diabetes mellitus with hypoglycemia without coma: Secondary | ICD-10-CM | POA: Diagnosis not present

## 2017-02-25 DIAGNOSIS — I5033 Acute on chronic diastolic (congestive) heart failure: Secondary | ICD-10-CM | POA: Diagnosis not present

## 2017-02-25 DIAGNOSIS — D696 Thrombocytopenia, unspecified: Secondary | ICD-10-CM | POA: Diagnosis not present

## 2017-02-25 DIAGNOSIS — K7460 Unspecified cirrhosis of liver: Secondary | ICD-10-CM | POA: Diagnosis not present

## 2017-02-25 DIAGNOSIS — I452 Bifascicular block: Secondary | ICD-10-CM | POA: Diagnosis present

## 2017-02-25 DIAGNOSIS — K7290 Hepatic failure, unspecified without coma: Secondary | ICD-10-CM | POA: Diagnosis present

## 2017-02-25 DIAGNOSIS — R0602 Shortness of breath: Secondary | ICD-10-CM | POA: Diagnosis not present

## 2017-02-25 DIAGNOSIS — I2729 Other secondary pulmonary hypertension: Secondary | ICD-10-CM | POA: Diagnosis present

## 2017-02-25 DIAGNOSIS — E7220 Disorder of urea cycle metabolism, unspecified: Secondary | ICD-10-CM | POA: Diagnosis present

## 2017-02-25 DIAGNOSIS — T68XXXA Hypothermia, initial encounter: Secondary | ICD-10-CM | POA: Diagnosis not present

## 2017-02-25 DIAGNOSIS — R05 Cough: Secondary | ICD-10-CM

## 2017-02-25 DIAGNOSIS — I130 Hypertensive heart and chronic kidney disease with heart failure and stage 1 through stage 4 chronic kidney disease, or unspecified chronic kidney disease: Secondary | ICD-10-CM | POA: Diagnosis not present

## 2017-02-25 DIAGNOSIS — R059 Cough, unspecified: Secondary | ICD-10-CM

## 2017-02-25 DIAGNOSIS — I517 Cardiomegaly: Secondary | ICD-10-CM | POA: Diagnosis not present

## 2017-02-25 DIAGNOSIS — N2889 Other specified disorders of kidney and ureter: Secondary | ICD-10-CM

## 2017-02-25 DIAGNOSIS — K7581 Nonalcoholic steatohepatitis (NASH): Secondary | ICD-10-CM | POA: Diagnosis not present

## 2017-02-25 DIAGNOSIS — I509 Heart failure, unspecified: Secondary | ICD-10-CM | POA: Diagnosis not present

## 2017-02-25 DIAGNOSIS — Z881 Allergy status to other antibiotic agents status: Secondary | ICD-10-CM

## 2017-02-25 DIAGNOSIS — I25110 Atherosclerotic heart disease of native coronary artery with unstable angina pectoris: Secondary | ICD-10-CM | POA: Diagnosis not present

## 2017-02-25 DIAGNOSIS — E875 Hyperkalemia: Secondary | ICD-10-CM | POA: Diagnosis not present

## 2017-02-25 DIAGNOSIS — I2781 Cor pulmonale (chronic): Secondary | ICD-10-CM | POA: Diagnosis present

## 2017-02-25 DIAGNOSIS — T17908A Unspecified foreign body in respiratory tract, part unspecified causing other injury, initial encounter: Secondary | ICD-10-CM | POA: Diagnosis not present

## 2017-02-25 DIAGNOSIS — Z794 Long term (current) use of insulin: Secondary | ICD-10-CM

## 2017-02-25 DIAGNOSIS — N179 Acute kidney failure, unspecified: Secondary | ICD-10-CM | POA: Diagnosis not present

## 2017-02-25 DIAGNOSIS — R188 Other ascites: Secondary | ICD-10-CM | POA: Diagnosis not present

## 2017-02-25 DIAGNOSIS — I50810 Right heart failure, unspecified: Secondary | ICD-10-CM

## 2017-02-25 DIAGNOSIS — E1129 Type 2 diabetes mellitus with other diabetic kidney complication: Secondary | ICD-10-CM | POA: Diagnosis present

## 2017-02-25 DIAGNOSIS — N183 Chronic kidney disease, stage 3 unspecified: Secondary | ICD-10-CM | POA: Diagnosis present

## 2017-02-25 DIAGNOSIS — E162 Hypoglycemia, unspecified: Secondary | ICD-10-CM

## 2017-02-25 DIAGNOSIS — I482 Chronic atrial fibrillation, unspecified: Secondary | ICD-10-CM

## 2017-02-25 DIAGNOSIS — Z7901 Long term (current) use of anticoagulants: Secondary | ICD-10-CM

## 2017-02-25 DIAGNOSIS — M109 Gout, unspecified: Secondary | ICD-10-CM | POA: Diagnosis not present

## 2017-02-25 DIAGNOSIS — G9341 Metabolic encephalopathy: Secondary | ICD-10-CM | POA: Diagnosis not present

## 2017-02-25 DIAGNOSIS — E1122 Type 2 diabetes mellitus with diabetic chronic kidney disease: Secondary | ICD-10-CM | POA: Diagnosis not present

## 2017-02-25 DIAGNOSIS — R0609 Other forms of dyspnea: Secondary | ICD-10-CM

## 2017-02-25 DIAGNOSIS — E1121 Type 2 diabetes mellitus with diabetic nephropathy: Secondary | ICD-10-CM | POA: Diagnosis present

## 2017-02-25 DIAGNOSIS — J9811 Atelectasis: Secondary | ICD-10-CM | POA: Diagnosis not present

## 2017-02-25 DIAGNOSIS — I959 Hypotension, unspecified: Secondary | ICD-10-CM | POA: Diagnosis not present

## 2017-02-25 DIAGNOSIS — K7031 Alcoholic cirrhosis of liver with ascites: Secondary | ICD-10-CM | POA: Diagnosis not present

## 2017-02-25 DIAGNOSIS — E871 Hypo-osmolality and hyponatremia: Secondary | ICD-10-CM | POA: Diagnosis not present

## 2017-02-25 DIAGNOSIS — Z888 Allergy status to other drugs, medicaments and biological substances status: Secondary | ICD-10-CM

## 2017-02-25 DIAGNOSIS — I5084 End stage heart failure: Secondary | ICD-10-CM | POA: Diagnosis not present

## 2017-02-25 DIAGNOSIS — Z87891 Personal history of nicotine dependence: Secondary | ICD-10-CM

## 2017-02-25 DIAGNOSIS — E440 Moderate protein-calorie malnutrition: Secondary | ICD-10-CM | POA: Diagnosis not present

## 2017-02-25 DIAGNOSIS — I10 Essential (primary) hypertension: Secondary | ICD-10-CM | POA: Diagnosis not present

## 2017-02-25 DIAGNOSIS — R072 Precordial pain: Secondary | ICD-10-CM | POA: Diagnosis not present

## 2017-02-25 DIAGNOSIS — I2721 Secondary pulmonary arterial hypertension: Secondary | ICD-10-CM | POA: Diagnosis not present

## 2017-02-25 DIAGNOSIS — E785 Hyperlipidemia, unspecified: Secondary | ICD-10-CM | POA: Diagnosis not present

## 2017-02-25 DIAGNOSIS — G250 Essential tremor: Secondary | ICD-10-CM | POA: Diagnosis present

## 2017-02-25 DIAGNOSIS — Z79899 Other long term (current) drug therapy: Secondary | ICD-10-CM

## 2017-02-25 DIAGNOSIS — E669 Obesity, unspecified: Secondary | ICD-10-CM | POA: Diagnosis present

## 2017-02-25 DIAGNOSIS — K766 Portal hypertension: Secondary | ICD-10-CM | POA: Diagnosis not present

## 2017-02-25 DIAGNOSIS — R0789 Other chest pain: Secondary | ICD-10-CM | POA: Diagnosis not present

## 2017-02-25 DIAGNOSIS — I5082 Biventricular heart failure: Secondary | ICD-10-CM | POA: Diagnosis present

## 2017-02-25 DIAGNOSIS — M1A09X0 Idiopathic chronic gout, multiple sites, without tophus (tophi): Secondary | ICD-10-CM | POA: Diagnosis not present

## 2017-02-25 DIAGNOSIS — Z6829 Body mass index (BMI) 29.0-29.9, adult: Secondary | ICD-10-CM

## 2017-02-25 DIAGNOSIS — E860 Dehydration: Secondary | ICD-10-CM | POA: Diagnosis present

## 2017-02-25 DIAGNOSIS — I4821 Permanent atrial fibrillation: Secondary | ICD-10-CM | POA: Diagnosis present

## 2017-02-25 HISTORY — DX: Type 2 diabetes mellitus without complications: E11.9

## 2017-02-25 HISTORY — DX: Other ascites: R18.8

## 2017-02-25 HISTORY — DX: Unspecified cirrhosis of liver: K74.60

## 2017-02-25 HISTORY — DX: Cor pulmonale (chronic): I27.81

## 2017-02-25 HISTORY — DX: Essential (primary) hypertension: I10

## 2017-02-25 LAB — COMPREHENSIVE METABOLIC PANEL
ALK PHOS: 216 U/L — AB (ref 38–126)
ALT: 25 U/L (ref 17–63)
ANION GAP: 11 (ref 5–15)
AST: 32 U/L (ref 15–41)
Albumin: 3.8 g/dL (ref 3.5–5.0)
BILIRUBIN TOTAL: 3.1 mg/dL — AB (ref 0.3–1.2)
BUN: 90 mg/dL — ABNORMAL HIGH (ref 6–20)
CALCIUM: 9.1 mg/dL (ref 8.9–10.3)
CO2: 23 mmol/L (ref 22–32)
CREATININE: 1.93 mg/dL — AB (ref 0.61–1.24)
Chloride: 88 mmol/L — ABNORMAL LOW (ref 101–111)
GFR calc non Af Amer: 33 mL/min — ABNORMAL LOW (ref >60)
GFR, EST AFRICAN AMERICAN: 38 mL/min — AB (ref >60)
Glucose, Bld: 62 mg/dL — ABNORMAL LOW (ref 65–99)
Potassium: 4.7 mmol/L (ref 3.5–5.1)
Sodium: 122 mmol/L — ABNORMAL LOW (ref 135–145)
TOTAL PROTEIN: 8.7 g/dL — AB (ref 6.5–8.1)

## 2017-02-25 LAB — CBC
HEMATOCRIT: 39.7 % (ref 39.0–52.0)
HEMOGLOBIN: 14.4 g/dL (ref 13.0–17.0)
MCH: 35 pg — AB (ref 26.0–34.0)
MCHC: 36.3 g/dL — AB (ref 30.0–36.0)
MCV: 96.6 fL (ref 78.0–100.0)
Platelets: 136 10*3/uL — ABNORMAL LOW (ref 150–400)
RBC: 4.11 MIL/uL — ABNORMAL LOW (ref 4.22–5.81)
RDW: 15.4 % (ref 11.5–15.5)
WBC: 12.5 10*3/uL — ABNORMAL HIGH (ref 4.0–10.5)

## 2017-02-25 LAB — BASIC METABOLIC PANEL
ANION GAP: 9 (ref 5–15)
BUN: 81 mg/dL — ABNORMAL HIGH (ref 6–20)
CALCIUM: 8.2 mg/dL — AB (ref 8.9–10.3)
CO2: 24 mmol/L (ref 22–32)
Chloride: 89 mmol/L — ABNORMAL LOW (ref 101–111)
Creatinine, Ser: 1.67 mg/dL — ABNORMAL HIGH (ref 0.61–1.24)
GFR, EST AFRICAN AMERICAN: 45 mL/min — AB (ref >60)
GFR, EST NON AFRICAN AMERICAN: 39 mL/min — AB (ref >60)
Glucose, Bld: 176 mg/dL — ABNORMAL HIGH (ref 65–99)
Potassium: 5.2 mmol/L — ABNORMAL HIGH (ref 3.5–5.1)
SODIUM: 122 mmol/L — AB (ref 135–145)

## 2017-02-25 LAB — AMMONIA: Ammonia: 75 umol/L — ABNORMAL HIGH (ref 9–35)

## 2017-02-25 LAB — CBG MONITORING, ED
Glucose-Capillary: 135 mg/dL — ABNORMAL HIGH (ref 65–99)
Glucose-Capillary: 86 mg/dL (ref 65–99)
Glucose-Capillary: 230 mg/dL — ABNORMAL HIGH (ref 65–99)
Glucose-Capillary: 239 mg/dL — ABNORMAL HIGH (ref 65–99)

## 2017-02-25 LAB — URINALYSIS, ROUTINE W REFLEX MICROSCOPIC
BILIRUBIN URINE: NEGATIVE
GLUCOSE, UA: 50 mg/dL — AB
Hgb urine dipstick: NEGATIVE
Ketones, ur: NEGATIVE mg/dL
LEUKOCYTES UA: NEGATIVE
NITRITE: NEGATIVE
PH: 5 (ref 5.0–8.0)
PROTEIN: NEGATIVE mg/dL
Specific Gravity, Urine: 1.01 (ref 1.005–1.030)

## 2017-02-25 LAB — PROTIME-INR
INR: 1.71
PROTHROMBIN TIME: 20.3 s — AB (ref 11.4–15.2)

## 2017-02-25 LAB — TROPONIN I
TROPONIN I: 0.03 ng/mL — AB (ref <0.03)
Troponin I: 0.04 ng/mL (ref <0.03)

## 2017-02-25 MED ORDER — DEXTROSE 50 % IV SOLN
1.0000 | Freq: Once | INTRAVENOUS | Status: AC
  Administered 2017-02-25: 50 mL via INTRAVENOUS

## 2017-02-25 MED ORDER — PANTOPRAZOLE SODIUM 40 MG PO TBEC
40.0000 mg | DELAYED_RELEASE_TABLET | Freq: Two times a day (BID) | ORAL | Status: DC
  Administered 2017-02-26 – 2017-03-07 (×18): 40 mg via ORAL
  Filled 2017-02-25 (×19): qty 1

## 2017-02-25 MED ORDER — CALCITRIOL 0.25 MCG PO CAPS
0.5000 ug | ORAL_CAPSULE | Freq: Every day | ORAL | Status: DC
  Administered 2017-02-26 – 2017-03-06 (×9): 0.5 ug via ORAL
  Filled 2017-02-25 (×10): qty 2

## 2017-02-25 MED ORDER — ONDANSETRON HCL 4 MG PO TABS: 4.0000 mg | ORAL_TABLET | Freq: Four times a day (QID) | ORAL | Status: DC | PRN

## 2017-02-25 MED ORDER — ONDANSETRON HCL 4 MG/2ML IJ SOLN
4.0000 mg | Freq: Four times a day (QID) | INTRAMUSCULAR | Status: DC | PRN
  Administered 2017-02-28: 4 mg via INTRAVENOUS
  Filled 2017-02-25: qty 2

## 2017-02-25 MED ORDER — DEXTROSE 10 % IV SOLN
Freq: Once | INTRAVENOUS | Status: AC
  Administered 2017-02-25: 16:00:00 via INTRAVENOUS

## 2017-02-25 MED ORDER — WARFARIN SODIUM 5 MG PO TABS
5.0000 mg | ORAL_TABLET | Freq: Once | ORAL | Status: AC
  Administered 2017-02-25: 5 mg via ORAL
  Filled 2017-02-25: qty 1

## 2017-02-25 MED ORDER — DEXTROSE 10 % IV SOLN: INTRAVENOUS | Status: DC

## 2017-02-25 MED ORDER — DEXTROSE 50 % IV SOLN
INTRAVENOUS | Status: AC
  Filled 2017-02-25: qty 50

## 2017-02-25 MED ORDER — SODIUM CHLORIDE 0.9 % IV BOLUS (SEPSIS)
1000.0000 mL | Freq: Once | INTRAVENOUS | Status: AC
  Administered 2017-02-25: 1000 mL via INTRAVENOUS

## 2017-02-25 MED ORDER — LACTULOSE 10 GM/15ML PO SOLN
20.0000 g | Freq: Two times a day (BID) | ORAL | Status: DC
  Administered 2017-02-25 – 2017-02-28 (×6): 20 g via ORAL
  Filled 2017-02-25 (×6): qty 30

## 2017-02-25 MED ORDER — PROPRANOLOL HCL 20 MG PO TABS
40.0000 mg | ORAL_TABLET | Freq: Two times a day (BID) | ORAL | Status: DC
  Administered 2017-02-26: 40 mg via ORAL
  Filled 2017-02-25 (×3): qty 2

## 2017-02-25 MED ORDER — FEBUXOSTAT 40 MG PO TABS
40.0000 mg | ORAL_TABLET | Freq: Every day | ORAL | Status: DC
  Administered 2017-02-26 – 2017-03-07 (×10): 40 mg via ORAL
  Filled 2017-02-25 (×11): qty 1

## 2017-02-25 MED ORDER — SODIUM CHLORIDE 0.9 % IV BOLUS (SEPSIS)
500.0000 mL | Freq: Once | INTRAVENOUS | Status: AC
  Administered 2017-02-25: 500 mL via INTRAVENOUS

## 2017-02-25 MED ORDER — SPIRONOLACTONE 25 MG PO TABS
50.0000 mg | ORAL_TABLET | Freq: Every day | ORAL | Status: DC
  Administered 2017-02-26: 50 mg via ORAL
  Filled 2017-02-25: qty 2

## 2017-02-25 MED ORDER — WARFARIN - PHARMACIST DOSING INPATIENT
Status: DC
  Administered 2017-02-26: 1

## 2017-02-25 MED ORDER — LACTULOSE 10 GM/15ML PO SOLN
30.0000 g | Freq: Once | ORAL | Status: AC
  Administered 2017-02-25: 30 g via ORAL
  Filled 2017-02-25: qty 60

## 2017-02-25 NOTE — ED Provider Notes (Signed)
[facility] DEPT Provider Note   CSN: [id number] Arrival date & time: 02/25/17  1347   By signing my name below, I, Hilbert Odor, attest that this documentation has been prepared under the direction and in the presence of Isla Pence, MD. Electronically Signed: Hilbert Odor, Scribe. 02/25/17. 2:01 PM. History   Chief Complaint Chief Complaint  Patient presents with  . Hypoglycemia    The history is provided by the patient. No language interpreter was used.  HPI Comments: Melvin Wang is a 76 y.o. male brought in by ambulance, who presents to the Emergency Department with an episode of hypoglycemia that occurred prior to arrival. Per EMS: Upon arrival patient was noted to be unresponsive with a CBG of 23. They immediately gave him D10 and his CBG went up to 265. While en route to the ED his CBG dropped back down to 53 and he was given a 2nd bag of D10 and it increased to 149. He also had one episode of vomiting while en route and was given Zofran. The patient states that he ate cereal this morning. He also states that he took 40 units of lantus this morning. He has not at any lunch yet. He states that he feels much better currently. He denies any pain.  Pt's wife said he was unresponsive and called 911.  When she was on the phone, she said he stopped breathing.  She did give him CPR briefly.  It is unclear if he had a pulse.    Pt said that he has recently been told that he can't drive and can't exercise.  He has had a lot of life changes and is depressed.  He denies any recent diabetes medication changes.  He also has a hx of a.fib (CHADVASC 4) and is on chronic coumadin.  Past Medical History:  Diagnosis Date  . Abnormality of gait   . Cataract   . CKD (chronic kidney disease), stage III    renal insufficiency, sees nephrologist: [facility]  . Diabetes mellitus ([place])   . Essential tremor   . Foot ulcer, right ([place]) 03/2013  . Gout   . Hyperlipidemia   .  Hypertension   . Permanent atrial fibrillation (HCC)    a. >20 years, prior DCCVs unsuccessful, INR followed by PCP.    Patient Active Problem List   Diagnosis Date Noted  . Hypoglycemia 02/25/2017  . Hyponatremia 02/25/2017  . Hypothermia 02/25/2017  . Hyperammonemia ([place]) 02/25/2017  . Right ventricular enlargement 01/21/2017  . CKD (chronic kidney disease), stage II 01/21/2017  . Acute respiratory failure ([place]) 01/19/2017  . 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 Anemia 01/19/2017  . Thrombocytopenia ([place]) 01/19/2017  . Hyperlipemia 06/18/2016  . Petechial rash 08/21/2015  . Gout 01/29/2015  . Permanent atrial fibrillation ([place]) 03/23/2013  . Abnormality of gait 03/18/2013  . Tremor, essential 03/18/2013  . Essential hypertension 03/18/2013  . 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 CATARACT EXTRACTION W/PHACO Left 04/19/2013   Procedure: CATARACT EXTRACTION PHACO AND INTRAOCULAR LENS PLACEMENT (IOC);  Surgeon: Tonny Branch, MD;  Location: [facility];  Service: Ophthalmology;  Laterality: Left;  CDE 29.30  . CATARACT EXTRACTION W/PHACO Right 07/15/2013   Procedure: CATARACT EXTRACTION PHACO AND INTRAOCULAR LENS PLACEMENT (IOC);  Surgeon: Tonny Branch, MD;  Location: [facility];  Service: Ophthalmology;  Laterality: Right;  CDE:12.84  . ESOPHAGOGASTRODUODENOSCOPY N/A 01/21/2017   Dr.  Fields: mild erosive gastritis and moderate erosive duodenitis (negative H.pylori). Multiple gastric polyps.        Home Medications    Prior to Admission medications   Medication Sig Start Date End Date Taking? Authorizing Provider  allopurinol (ZYLOPRIM) 300 MG tablet Take 300 mg by mouth daily.   Yes Historical Provider, MD  calcitRIOL (ROCALTROL) 0.5 MCG capsule Take 0.5 mcg by mouth daily.   Yes Historical Provider, MD  febuxostat (ULORIC) 40 MG tablet Take 1 tablet (40 mg total) by mouth  daily. 02/21/17  Yes Claretta Fraise, MD  lactulose (CHRONULAC) 10 GM/15ML solution Take 30 mLs (20 g total) by mouth 2 (two) times daily. 02/21/17  Yes Claretta Fraise, MD  LANTUS SOLOSTAR 100 UNIT/ML Solostar Pen INJECT 40 UNITS INTO THE SKIN EVERY MORNING. 01/27/17  Yes Claretta Fraise, MD  propranolol (INDERAL) 40 MG tablet Take 1 tablet (40 mg total) by mouth 2 (two) times daily. 01/28/17  Yes Lendon Colonel, NP  spironolactone (ALDACTONE) 50 MG tablet Take 1 tablet (50 mg total) by mouth daily with breakfast. 02/21/17  Yes Claretta Fraise, MD  torsemide (DEMADEX) 20 MG tablet 2 each morning and one in afternoon Patient taking differently: Take 20-40 mg by mouth 2 (two) times daily. Pt takes two tablets in the morning and one in the evening. 02/21/17  Yes Claretta Fraise, MD  warfarin (COUMADIN) 10 MG tablet Take 5 mg by mouth See admin instructions. Pt takes every evening on Sunday, Tuesday, Wednesday, Thursday, and Saturday.   Yes Historical Provider, MD  pantoprazole (PROTONIX) 40 MG tablet Take 1 tablet (40 mg total) by mouth 2 (two) times daily before a meal. Patient not taking: Reported on 02/25/2017 01/22/17   Eber Jones, MD    Family History Family History  Problem Relation Age of Onset  . Colon cancer Father 63    Deceased age 7   . Cancer Paternal Uncle   . Liver disease Neg Hx     Social History Social History  Substance Use Topics  . Smoking status: Former Smoker    Packs/day: 1.00    Years: 15.00    Types: Cigarettes    Quit date: 07/07/1984  . Smokeless tobacco: Never Used     Comment: Smoked 20 years  . Alcohol use No     Comment: HISTORY OF HEAVY ETOH USE in 1966-1968, otherwise none      Allergies   Acyclovir and related; Metformin and related; Amoxicillin; Augmentin [amoxicillin-pot clavulanate]; and Mavik [trandolapril]   Review of Systems Review of Systems  Constitutional: Negative for appetite change and fatigue.  Eyes: Negative for discharge.    Respiratory: Negative for cough.   Cardiovascular: Negative for chest pain.  Gastrointestinal: Positive for vomiting. Negative for abdominal pain and diarrhea.  Neurological: Negative for headaches.  All other systems reviewed and are negative.    Physical Exam Updated Vital Signs BP 99/84   Pulse 67   Temp (!) 94.6 F (34.8 C)   Resp 14   Ht 6\' 1"  (1.854 m)   Wt 220 lb (99.8 kg)   SpO2 98%   BMI 29.03 kg/m   Physical Exam  Constitutional: He is oriented to person, place, and time. He appears well-developed. He appears lethargic. He appears distressed.  HENT:  Head: Normocephalic and atraumatic.  Mouth/Throat: Mucous membranes are dry.  Dry mucous membranes.  Eyes: EOM are normal.  Neck: Normal range of motion.  Cardiovascular: Normal rate, normal heart sounds and intact distal pulses.  An irregularly irregular rhythm present.  Pulmonary/Chest: Effort normal and breath sounds normal. No respiratory distress.  Abdominal: Soft. He exhibits no distension. There is no tenderness.  Musculoskeletal: Normal range of motion.  Neurological: He is oriented to person, place, and time. He appears lethargic.  Skin: Skin is warm and dry.  Psychiatric: He has a normal mood and affect. Judgment normal.  Nursing note and vitals reviewed.    ED Treatments / Results  DIAGNOSTIC STUDIES: Oxygen Saturation is 96% on [place], adequate by my interpretation.    COORDINATION OF CARE: 1:49 PM Discussed treatment plan with pt at bedside and pt agreed to plan. I will check the patient's labs.  Labs (all labs ordered are listed, but only abnormal results are displayed) Labs Reviewed  CBC - Abnormal; Notable for the following:       Result Value   WBC 12.5 (*)    RBC 4.11 (*)    MCH 35.0 (*)    MCHC 36.3 (*)    Platelets 136 (*)    All other components within normal limits  COMPREHENSIVE METABOLIC PANEL - Abnormal; Notable for the following:    Sodium 122 (*)    Chloride 88 (*)     Glucose, Bld 62 (*)    BUN 90 (*)    Creatinine, Ser 1.93 (*)    Total Protein 8.7 (*)    Alkaline Phosphatase 216 (*)    Total Bilirubin 3.1 (*)    GFR calc non Af Amer 33 (*)    GFR calc Af Amer 38 (*)    All other components within normal limits  URINALYSIS, ROUTINE W REFLEX MICROSCOPIC - Abnormal; Notable for the following:    Glucose, UA 50 (*)    All other components within normal limits  AMMONIA - Abnormal; Notable for the following:    Ammonia 75 (*)    All other components within normal limits  PROTIME-INR - Abnormal; Notable for the following:    Prothrombin Time 20.3 (*)    All other components within normal limits  TROPONIN I - Abnormal; Notable for the following:    Troponin I 0.04 (*)    All other components within normal limits  CBG MONITORING, ED - Abnormal; Notable for the following:    Glucose-Capillary 135 (*)    All other components within normal limits  CBG MONITORING, ED - Abnormal; Notable for the following:    Glucose-Capillary 230 (*)    All other components within normal limits  CBG MONITORING, ED  CBG MONITORING, ED    EKG  EKG Interpretation  Date/Time:  Tuesday February 25 2017 13:47:38 EDT Ventricular Rate:  64 PR Interval:    QRS Duration: 177 QT Interval:  476 QTC Calculation: 492 R Axis:   -91 Text Interpretation:  Atrial fibrillation RBBB and LAFB Abnrm T, consider ischemia, anterolateral lds No significant change since last tracing Confirmed by Annie Jeffrey [facility] MD, Iyonna Rish ([id number]) on 02/25/2017 2:00:10 PM       Radiology Dg Chest Port 1 View  Result Date: 02/25/2017 CLINICAL DATA:  Found unresponsive this morning, mental status changes EXAM: PORTABLE CHEST 1 VIEW COMPARISON:  01/19/2017 FINDINGS: Cardiomegaly again noted. Central mild vascular congestion without convincing pulmonary edema. Elevation of the right hemidiaphragm again noted. Mild left basilar atelectasis. IMPRESSION: Central mild vascular congestion without convincing pulmonary  edema. Elevation of the right hemidiaphragm again noted. Mild left basilar atelectasis. Electronically Signed   By: Lahoma Crocker M.D.   On: 02/25/2017 14:25  Procedures Procedures (including critical care time)  Medications Ordered in ED Medications  dextrose 50 % solution 50 mL (50 mLs Intravenous Given 02/25/17 1526)  dextrose 10 % infusion ( Intravenous New Bag/Given 02/25/17 1530)  sodium chloride 0.9 % bolus 1,000 mL (0 mLs Intravenous Stopped 02/25/17 1758)  sodium chloride 0.9 % bolus 1,000 mL (1,000 mLs Intravenous New Bag/Given 02/25/17 1759)     Initial Impression / Assessment and Plan / ED Course  I have reviewed the triage vital signs and the nursing notes.  Pertinent labs & imaging results that were available during my care of the patient were reviewed by me and considered in my medical decision making (see chart for details).    CRITICAL CARE Performed by: Isla Pence   Total critical care time: 82minutes  Critical care time was exclusive of separately billable procedures and treating other patients.  Critical care was necessary to treat or prevent imminent or life-threatening deterioration.  Critical care was time spent personally by me on the following activities: development of treatment plan with patient and/or surrogate as well as nursing, discussions with consultants, evaluation of patient's response to treatment, examination of patient, obtaining history from patient or surrogate, ordering and performing treatments and interventions, ordering and review of laboratory studies, ordering and review of radiographic studies, pulse oximetry and re-evaluation of patient's condition.  Pt is feeling better, but still not back to baseline.  He has multiple electrolyte abnormalities along with hypotension and hypothermia.  He was d/w Dr. Olevia Bowens for admission.  Final Clinical Impressions(s) / ED Diagnoses   Final diagnoses:  Hypothermia, initial encounter  Hypotension,  unspecified hypotension type  Hypoglycemia  Chronic atrial fibrillation (HCC)  Thrombocytopenia (HCC)  Chronic renal insufficiency, stage 3 (moderate)  Hyponatremia    New Prescriptions New Prescriptions   No medications on file   I personally performed the services described in this documentation, which was scribed in my presence. The recorded information has been reviewed and is accurate.    Isla Pence, MD 02/25/17 [phone]

## 2017-02-25 NOTE — ED Notes (Signed)
Hospitalist at bedside 

## 2017-02-25 NOTE — ED Notes (Signed)
Dr Gilford Raid notified of temp 92.9, cbg and troponin 0.04.

## 2017-02-25 NOTE — ED Triage Notes (Signed)
Pt brought in by EMS. Pt found unresponsive with CBG 23 and given D10 then CBG up to 265. Once en route dropped to 53 and given 2nd bag of D10 and increased 149. Vomited x 1 and given zofran 4 mg IV. Pt ate cereral this morning and reports meds have been recently changed fro DM. Took 40 U of lantus this am

## 2017-02-25 NOTE — Progress Notes (Signed)
Attempted to get report. RN to call me back soon.  Darshay Deupree Rica Mote, RN

## 2017-02-25 NOTE — ED Notes (Signed)
Bare hugger placed on pt

## 2017-02-25 NOTE — Progress Notes (Signed)
[facility] for Warfarin Indication: atrial fibrillation  Allergies  Allergen Reactions  . Acyclovir And Related Hives  . Metformin And Related Diarrhea  . Amoxicillin Hives and Other (See Comments)    Has patient had a PCN reaction causing immediate rash, facial/tongue/throat swelling, SOB or lightheadedness with hypotension: No Has patient had a PCN reaction causing severe rash involving mucus membranes or skin necrosis: No Has patient had a PCN reaction that required hospitalization No Has patient had a PCN reaction occurring within the last 10 years: No If all of the above answers are "NO", then may proceed with Cephalosporin use.  . Augmentin [Amoxicillin-Pot Clavulanate] Hives and Other (See Comments)    Has patient had a PCN reaction causing immediate rash, facial/tongue/throat swelling, SOB or lightheadedness with hypotension: No Has patient had a PCN reaction causing severe rash involving mucus membranes or skin necrosis: No Has patient had a PCN reaction that required hospitalization No Has patient had a PCN reaction occurring within the last 10 years: No If all of the above answers are "NO", then may proceed with Cephalosporin use.  [name] [Trandolapril] Other (See Comments)    Reaction:  Caused renal problems    Patient Measurements: Height: 6\' 1"  (185.4 cm) Weight: 220 lb 10.9 oz (100.1 kg) IBW/kg (Calculated) : 79.9  Vital Signs: Temp: 96.9 F (36.1 C) (03/20 2100) Temp Source: Rectal (03/20 1518) BP: 88/61 (03/20 2000) Pulse Rate: 59 (03/20 2000)  Labs:  Recent Labs  02/25/17 1401 02/25/17 1955  HGB 14.4  --   HCT 39.7  --   PLT 136*  --   LABPROT 20.3*  --   INR 1.71  --   CREATININE 1.93* 1.67*  TROPONINI 0.04* 0.03*    Estimated Creatinine Clearance: 49 mL/min (A) (by C-G formula based on SCr of 1.67 mg/dL (H)).   Medical History: Past Medical History:  Diagnosis Date  . Abnormality of gait   . Cataract    . Cirrhosis of liver with ascites ([place]) 01/19/2017  . CKD (chronic kidney disease), stage III    renal insufficiency, sees nephrologist: [facility]  . Diabetes mellitus ([place])   . Essential tremor   . Foot ulcer, right ([place]) 03/2013  . Gout   . Hyperlipidemia   . Hypertension   . Permanent atrial fibrillation (HCC)    a. >20 years, prior DCCVs unsuccessful, INR followed by PCP.    Medications:  Prescriptions Prior to Admission  Medication Sig Dispense Refill Last Dose  . allopurinol (ZYLOPRIM) 300 MG tablet Take 300 mg by mouth daily.   02/25/2017 at Unknown time  . calcitRIOL (ROCALTROL) 0.5 MCG capsule Take 0.5 mcg by mouth daily.   02/25/2017 at Unknown time  . febuxostat (ULORIC) 40 MG tablet Take 1 tablet (40 mg total) by mouth daily. 30 tablet 5 02/25/2017 at Unknown time  . lactulose (CHRONULAC) 10 GM/15ML solution Take 30 mLs (20 g total) by mouth 2 (two) times daily. 1892 mL 5 02/25/2017 at Unknown time  . LANTUS SOLOSTAR 100 UNIT/ML Solostar Pen INJECT 40 UNITS INTO THE SKIN EVERY MORNING. 15 mL 2 02/25/2017 at Unknown time  . propranolol (INDERAL) 40 MG tablet Take 1 tablet (40 mg total) by mouth 2 (two) times daily. 60 tablet 11 02/25/2017 at 0800  . spironolactone (ALDACTONE) 50 MG tablet Take 1 tablet (50 mg total) by mouth daily with breakfast. 30 tablet 5 02/25/2017 at Unknown time  . torsemide (DEMADEX) 20 MG tablet 2 each  morning and one in afternoon (Patient taking differently: Take 20-40 mg by mouth 2 (two) times daily. Pt takes two tablets in the morning and one in the evening.) 90 tablet 3 02/25/2017 at Unknown time  . warfarin (COUMADIN) 10 MG tablet Take 5 mg by mouth See admin instructions. Pt takes every evening on Sunday, Tuesday, Wednesday, Thursday, and Saturday.   02/23/2017 at 1800  . pantoprazole (PROTONIX) 40 MG tablet Take 1 tablet (40 mg total) by mouth 2 (two) times daily before a meal. (Patient not taking: Reported on 02/25/2017) 60 tablet 0 Not Taking at  Unknown time    Assessment: Okay for Protocol, has not received dose today per home med list. INR below goal. Goal of Therapy:  INR 2-3   Plan:  Warfarin 5mg  PO x 1  Daily PT/INR Monitor for signs and symptoms of bleeding.   Pricilla Larsson 02/25/2017,9:33 PM

## 2017-02-25 NOTE — ED Notes (Signed)
Pt given lunch tray. Family brought in banana for him to eat

## 2017-02-25 NOTE — ED Notes (Signed)
Emptied patient's foley bag of 325 ml urine prior to transfer to floor.

## 2017-02-25 NOTE — H&P (Signed)
History and Physical    Melvin Wang NFA:[id number] DOB: 1941-10-02 DOA: 02/25/2017  PCP: Claretta Fraise, MD   Patient coming from: Home.  I have personally briefly reviewed patient's old medical records in [place]  Chief Complaint: Hypoglycemia  HPI: Melvin Wang is a 76 y.o. male with medical history significant of gait abnormality, cataract, stage III chronic kidney disease, type 2 diabetes, essential tremor, gout, hyperlipidemia, hypertension, permanent atrial fibrillation on warfarin who was brought to the emergency department via EMS after he was found unresponsive by his wife.  Pertinent notes and patient, he was on his recliner when he was found unresponsive by his  wife who called EMS and briefly perform CPR, since she stated that the patient was not breathing. There is no mention of whether the patient had a pulse or not. When EMS to arrive, his CBG was 23 mg/dL. He was given D10, which had his CBG go up to 265 mg/dL. He subsequently was given more dextrose after his CBG dropped to 53 mg/dL. He was also given Zofran 4 mg IVP since he had one episode of emesis per ED triage notes. He was hypothermic when he arrived to the emergency department.  The patient states that he took his 40 units of Lantus at about 0500 this morning. He ate cereal, but did not have anything else to eat after that. He mentions that he has been eating less and loosing a significant amount of weight over the past few months, but his Lantus dosing has remained the same. He denies fever, headache, sore throat, productive cough, wheezing, dyspnea, chest pain, dizziness, PND or orthopnea. He states that his lower extremity edema has resolved or been minimal after he was started on torsemide. He denies abdominal pain, nausea, diarrhea, melena, hematochezia, but complains of constipation despite taking lactulose twice a day. He denies dysuria, frequency or hematuria. He complains of nocturia.  ED Course:  The patient was started on dextrose infusion and bair hugger/warming blankets in the emergency department. He was given normal saline 2000 mL and started on dextrose 10% infusion.   EKG show atrial fibrillation with abnormal T waves in lateral leads. Troponin level was 0.04 ng/mL. WBC is 12.5, hemoglobin 14.4 and platelets 536. PT 20.3 seconds and INR 1.71. His ammonia level was 75, sodium 122, potassium 4.7, chloride 88 and bicarbonate 23 mmol/L. His BUN was 90, creatinine 1.93 and glucose 62 mg/dL. His most recent CBG was 239 mg/dL. Alkaline phosphatase was 216 U and total bilirubin 3.1 mg/dL. His chest radiograph showed mild vascular congestion and previously seen right hemidiaphragm elevation.  Review of Systems: As per HPI otherwise 10 point review of systems negative.    Past Medical History:  Diagnosis Date  . Abnormality of gait   . Cataract   . CKD (chronic kidney disease), stage III    renal insufficiency, sees nephrologist: [facility]  . Diabetes mellitus ([place])   . Essential tremor   . Foot ulcer, right ([place]) 03/2013  . Gout   . Hyperlipidemia   . Hypertension   . Permanent atrial fibrillation (HCC)    a. >20 years, prior DCCVs unsuccessful, INR followed by PCP.    Past Surgical History:  Procedure Laterality Date  . CATARACT EXTRACTION W/PHACO Left 04/19/2013   Procedure: CATARACT EXTRACTION PHACO AND INTRAOCULAR LENS PLACEMENT (IOC);  Surgeon: Tonny Branch, MD;  Location: [facility];  Service: Ophthalmology;  Laterality: Left;  CDE 29.30  . CATARACT EXTRACTION W/PHACO Right 07/15/2013  Procedure: CATARACT EXTRACTION PHACO AND INTRAOCULAR LENS PLACEMENT (IOC);  Surgeon: Tonny Branch, MD;  Location: [facility];  Service: Ophthalmology;  Laterality: Right;  CDE:12.84  . ESOPHAGOGASTRODUODENOSCOPY N/A 01/21/2017   Dr. Oneida Alar: mild erosive gastritis and moderate erosive duodenitis (negative H.pylori). Multiple gastric polyps.      reports that he quit smoking about 32 years ago. His  smoking use included Cigarettes. He has a 15.00 pack-year smoking history. He has never used smokeless tobacco. He reports that he does not drink alcohol or use drugs.  Allergies  Allergen Reactions  . Acyclovir And Related Hives  . Metformin And Related Diarrhea  . Amoxicillin Hives and Other (See Comments)    Has patient had a PCN reaction causing immediate rash, facial/tongue/throat swelling, SOB or lightheadedness with hypotension: No Has patient had a PCN reaction causing severe rash involving mucus membranes or skin necrosis: No Has patient had a PCN reaction that required hospitalization No Has patient had a PCN reaction occurring within the last 10 years: No If all of the above answers are "NO", then may proceed with Cephalosporin use.  . Augmentin [Amoxicillin-Pot Clavulanate] Hives and Other (See Comments)    Has patient had a PCN reaction causing immediate rash, facial/tongue/throat swelling, SOB or lightheadedness with hypotension: No Has patient had a PCN reaction causing severe rash involving mucus membranes or skin necrosis: No Has patient had a PCN reaction that required hospitalization No Has patient had a PCN reaction occurring within the last 10 years: No If all of the above answers are "NO", then may proceed with Cephalosporin use.  [name] [Trandolapril] Other (See Comments)    Reaction:  Caused renal problems    Family History  Problem Relation Age of Onset  . Colon cancer Father 22    Deceased age 74   . Cancer Paternal Uncle   . Liver disease Neg Hx     Prior to Admission medications   Medication Sig Start Date End Date Taking? Authorizing Provider  allopurinol (ZYLOPRIM) 300 MG tablet Take 300 mg by mouth daily.   Yes Historical Provider, MD  calcitRIOL (ROCALTROL) 0.5 MCG capsule Take 0.5 mcg by mouth daily.   Yes Historical Provider, MD  febuxostat (ULORIC) 40 MG tablet Take 1 tablet (40 mg total) by mouth daily. 02/21/17  Yes Claretta Fraise, MD  lactulose  (CHRONULAC) 10 GM/15ML solution Take 30 mLs (20 g total) by mouth 2 (two) times daily. 02/21/17  Yes Claretta Fraise, MD  LANTUS SOLOSTAR 100 UNIT/ML Solostar Pen INJECT 40 UNITS INTO THE SKIN EVERY MORNING. 01/27/17  Yes Claretta Fraise, MD  propranolol (INDERAL) 40 MG tablet Take 1 tablet (40 mg total) by mouth 2 (two) times daily. 01/28/17  Yes Lendon Colonel, NP  spironolactone (ALDACTONE) 50 MG tablet Take 1 tablet (50 mg total) by mouth daily with breakfast. 02/21/17  Yes Claretta Fraise, MD  torsemide (DEMADEX) 20 MG tablet 2 each morning and one in afternoon Patient taking differently: Take 20-40 mg by mouth 2 (two) times daily. Pt takes two tablets in the morning and one in the evening. 02/21/17  Yes Claretta Fraise, MD  warfarin (COUMADIN) 10 MG tablet Take 5 mg by mouth See admin instructions. Pt takes every evening on Sunday, Tuesday, Wednesday, Thursday, and Saturday.   Yes Historical Provider, MD  pantoprazole (PROTONIX) 40 MG tablet Take 1 tablet (40 mg total) by mouth 2 (two) times daily before a meal. Patient not taking: Reported on 02/25/2017 01/22/17   Eber Jones,  MD    Physical Exam:  Constitutional: NAD, calm, comfortable Vitals:   02/25/17 1500 02/25/17 1518 02/25/17 1730 02/25/17 1745  BP: 108/83  99/84   Pulse: (!) 49  61 67  Resp: 18  16 14   Temp:  (!) 92.9 F (33.8 C) (!) 93.6 F (34.2 C) (!) 94.6 F (34.8 C)  TempSrc:  Rectal    SpO2: 98%  98% 98%  Weight:      Height:       Eyes: PERRL, lids and conjunctivae normal ENMT: Mucous membranes are moist. Posterior pharynx clear of any exudate or lesions. Poor state of repair or absent dentition.  Neck: normal, supple, no masses, no thyromegaly Respiratory: clear to auscultation bilaterally, no wheezing, no crackles. Normal respiratory effort. No accessory muscle use.  Cardiovascular: Irregularly irregular, no murmurs / rubs / gallops. No extremity edema. 2+ pedal pulses. No carotid bruits.  Abdomen: Bowel sounds  positive. Soft, no tenderness, no masses palpated. No hepatosplenomegaly.  GU: Foley catheter present. Musculoskeletal: no clubbing / cyanosis. Good ROM, no contractures. Normal muscle tone.  Skin: Small ecchymotic areas on abdomen from insulin injections and along extremities. Neurologic: CN 2-12 grossly intact. Sensation intact, DTR normal. Strength 5/5 in all 4.  Psychiatric: Normal judgment and insight. Alert and oriented x 3. Normal mood.    Labs on Admission: I have personally reviewed following labs and imaging studies  CBC:  Recent Labs Lab 02/25/17 1401  WBC 12.5*  HGB 14.4  HCT 39.7  MCV 96.6  PLT 505*   Basic Metabolic Panel:  Recent Labs Lab 02/25/17 1401  NA 122*  K 4.7  CL 88*  CO2 23  GLUCOSE 62*  BUN 90*  CREATININE 1.93*  CALCIUM 9.1   GFR: Estimated Creatinine Clearance: 42.4 mL/min (A) (by C-G formula based on SCr of 1.93 mg/dL (H)). Liver Function Tests:  Recent Labs Lab 02/25/17 1401  AST 32  ALT 25  ALKPHOS 216*  BILITOT 3.1*  PROT 8.7*  ALBUMIN 3.8   No results for input(s): LIPASE, AMYLASE in the last 168 hours.  Recent Labs Lab 02/25/17 1411  AMMONIA 75*   Coagulation Profile:  Recent Labs Lab 02/21/17 0953 02/25/17 1401  INR 2.0* 1.71   Cardiac Enzymes:  Recent Labs Lab 02/25/17 1401  TROPONINI 0.04*   BNP (last 3 results) No results for input(s): PROBNP in the last 8760 hours. HbA1C: No results for input(s): HGBA1C in the last 72 hours. CBG:  Recent Labs Lab 02/25/17 1350 02/25/17 1504 02/25/17 1613  GLUCAP 135* 86 230*   Lipid Profile: No results for input(s): CHOL, HDL, LDLCALC, TRIG, CHOLHDL, LDLDIRECT in the last 72 hours. Thyroid Function Tests: No results for input(s): TSH, T4TOTAL, FREET4, T3FREE, THYROIDAB in the last 72 hours. Anemia Panel: No results for input(s): VITAMINB12, FOLATE, FERRITIN, TIBC, IRON, RETICCTPCT in the last 72 hours. Urine analysis:    Component Value Date/Time    COLORURINE YELLOW 02/25/2017 [place] 02/25/2017 1355   APPEARANCEUR Clear 09/28/2013 0845   LABSPEC 1.010 02/25/2017 1355   PHURINE 5.0 02/25/2017 1355   GLUCOSEU 50 (A) 02/25/2017 1355   HGBUR NEGATIVE 02/25/2017 1355   BILIRUBINUR NEGATIVE 02/25/2017 1355   BILIRUBINUR neg 12/07/2015 0939   BILIRUBINUR Negative 09/28/2013 0845   KETONESUR NEGATIVE 02/25/2017 1355   PROTEINUR NEGATIVE 02/25/2017 1355   UROBILINOGEN negative 12/07/2015 0939   UROBILINOGEN 0.2 02/19/2014 1612   NITRITE NEGATIVE 02/25/2017 1355   LEUKOCYTESUR NEGATIVE 02/25/2017 1355   LEUKOCYTESUR  Negative 09/28/2013 0845    Radiological Exams on Admission: Dg Chest Port 1 View  Result Date: 02/25/2017 CLINICAL DATA:  Found unresponsive this morning, mental status changes EXAM: PORTABLE CHEST 1 VIEW COMPARISON:  01/19/2017 FINDINGS: Cardiomegaly again noted. Central mild vascular congestion without convincing pulmonary edema. Elevation of the right hemidiaphragm again noted. Mild left basilar atelectasis. IMPRESSION: Central mild vascular congestion without convincing pulmonary edema. Elevation of the right hemidiaphragm again noted. Mild left basilar atelectasis. Electronically Signed   By: Lahoma Crocker M.D.   On: 02/25/2017 14:25   Echocardiogram 01/20/2017 ------------------------------------------------------------------- LV EF: 55% -   60%  ------------------------------------------------------------------- Indications:      CHF - 428.0.  ------------------------------------------------------------------- History:   PMH:   Atrial fibrillation.  PMH:  Cirrhosis of liver with Ascites.  Risk factors:  Hypertension. Diabetes mellitus.  ------------------------------------------------------------------- Study Conclusions  - Left ventricle: The cavity size was normal. Wall thickness was   increased in a pattern of mild LVH. Systolic function was normal.   The estimated ejection fraction was  in the range of 55% to 60%. - Regional wall motion abnormality: Hypokinesis of the basal-mid   anteroseptal myocardium. - Aortic valve: Valve area (VTI): 2.32 cm^2. Valve area (Vmax):   2.32 cm^2. Valve area (Vmean): 2.16 cm^2. - Mitral valve: There was mild regurgitation. - Left atrium: The atrium was severely dilated. - Right ventricle: The cavity size was severely dilated. TAPSE: 16   mm . - Right atrium: The atrium was severely dilated. - Atrial septum: No defect or patent foramen ovale was identified. - Tricuspid valve: There was mild-moderate regurgitation. The   available color Doppler images of the TR may underestimate its   severity, especially in the setting of a flattened ventricular   septum in diastole and systolic hepatic flow reversal which would   suggest more severe TR. - Pulmonary arteries: Systolic pressure could not be accurately   estimated. Inadequate TR jet, available Doppler likely   underestimates. - Inferior vena cava: The vessel was dilated. The respirophasic   diameter changes were blunted (< 50%), consistent with elevated   central venous pressure. - Pericardium, extracardiac: There is a large left pleural   effusion. - Technically difficult study. Echocontrast was used to enhance   visualization.  EKG: Independently reviewed. Vent. rate 64 BPM PR interval * ms QRS duration 177 ms QT/QTc 476/492 ms P-R-T axes * 269 -48 Atrial fibrillation RBBB and LAFB Abnrm T, consider ischemia, anterolateral lds  Assessment/Plan Principal Problem:   Hypoglycemia Admit to stepdown/inpatient. Continue dextrose 10% infusion. Lantus has been held. CBG monitoring every 1 hour until hypoglycemia results.  Active Problems:   Hypothermia Likely due to hypoglycemia. He denies fever, productive cough, diarrhea or GU symptoms. He denies prolonged exposure to cold temperatures. Continue Bair hugger warming blanket. Monitor temperature.    Hyponatremia Likely a  combination of torsemide use and increase oral water intake. The patient received normal saline 2000 mL bolus in the ED. Will hold torsemide for now. Monitor sodium level.    Essential hypertension Continue propranolol 40 mg by mouth twice a day. Torsemide has been held. Monitor blood pressure.    Permanent atrial fibrillation (HCC) CHA2DS2-VASc Score of at least 5. Continue propranolol for rate control. Warfarin per pharmacy.    Gout Per patient, allopurinol was stopped. Continue Uloric 40 mg by mouth daily.     Tremor, essential Continue propranolol.    Hyperammonemia (HCC) Continue lactulose 20 g by mouth twice a day. Follow-up ammonia level  in the morning.    Cirrhosis of liver with ascites (HCC) Monitor LFTs and ammonia level. The patient is currently off torsemide. Monitor intake and output.    CKD (chronic kidney disease), stage III Monitor BUN, creatinine and electrolytes.    Type 2 diabetes mellitus with renal complication (HCC) Carbohydrate modified diet. Hold Lantus for now.    DVT prophylaxis: On warfarin. Code Status: Full code. Family Communication: His daughter and son-in-law were present in the ED room. Disposition Plan: Admit for hypoglycemia, hypothermia, hyponatremia and hyperammonemia treatment Consults called:  Admission status: Inpatient/stepdown.   Reubin Milan MD [facility] Pager [phone].  If 7PM-7AM, please contact night-coverage [email or web address] Password [facility]  02/25/2017, 6:15 PM

## 2017-02-26 LAB — GLUCOSE, CAPILLARY
GLUCOSE-CAPILLARY: 140 mg/dL — AB (ref 65–99)
GLUCOSE-CAPILLARY: 193 mg/dL — AB (ref 65–99)
Glucose-Capillary: 136 mg/dL — ABNORMAL HIGH (ref 65–99)
Glucose-Capillary: 188 mg/dL — ABNORMAL HIGH (ref 65–99)
Glucose-Capillary: 96 mg/dL (ref 65–99)
Glucose-Capillary: 149 mg/dL — ABNORMAL HIGH (ref 65–99)

## 2017-02-26 LAB — COMPREHENSIVE METABOLIC PANEL
ALT: 24 U/L (ref 17–63)
ANION GAP: 9 (ref 5–15)
AST: 29 U/L (ref 15–41)
Albumin: 3.3 g/dL — ABNORMAL LOW (ref 3.5–5.0)
Alkaline Phosphatase: 183 U/L — ABNORMAL HIGH (ref 38–126)
BUN: 70 mg/dL — ABNORMAL HIGH (ref 6–20)
CALCIUM: 8.3 mg/dL — AB (ref 8.9–10.3)
CO2: 22 mmol/L (ref 22–32)
CREATININE: 1.62 mg/dL — AB (ref 0.61–1.24)
Chloride: 94 mmol/L — ABNORMAL LOW (ref 101–111)
GFR calc Af Amer: 47 mL/min — ABNORMAL LOW (ref >60)
GFR calc non Af Amer: 40 mL/min — ABNORMAL LOW (ref >60)
GLUCOSE: 141 mg/dL — AB (ref 65–99)
Potassium: 4.5 mmol/L (ref 3.5–5.1)
SODIUM: 125 mmol/L — AB (ref 135–145)
TOTAL PROTEIN: 7.6 g/dL (ref 6.5–8.1)
Total Bilirubin: 3.4 mg/dL — ABNORMAL HIGH (ref 0.3–1.2)

## 2017-02-26 LAB — CBC WITH DIFFERENTIAL/PLATELET
Basophils Absolute: 0 10*3/uL (ref 0.0–0.1)
Basophils Relative: 0 %
EOS ABS: 0.1 10*3/uL (ref 0.0–0.7)
EOS PCT: 1 %
HCT: 34.7 % — ABNORMAL LOW (ref 39.0–52.0)
Hemoglobin: 12.3 g/dL — ABNORMAL LOW (ref 13.0–17.0)
LYMPHS ABS: 1.1 10*3/uL (ref 0.7–4.0)
Lymphocytes Relative: 11 %
MCH: 34.3 pg — AB (ref 26.0–34.0)
MCHC: 35.4 g/dL (ref 30.0–36.0)
MCV: 96.7 fL (ref 78.0–100.0)
MONO ABS: 1.1 10*3/uL — AB (ref 0.1–1.0)
MONOS PCT: 11 %
Neutro Abs: 7.8 10*3/uL — ABNORMAL HIGH (ref 1.7–7.7)
Neutrophils Relative %: 78 %
PLATELETS: 111 10*3/uL — AB (ref 150–400)
RBC: 3.59 MIL/uL — ABNORMAL LOW (ref 4.22–5.81)
RDW: 15.3 % (ref 11.5–15.5)
WBC: 10 10*3/uL (ref 4.0–10.5)

## 2017-02-26 LAB — AMMONIA: Ammonia: 48 umol/L — ABNORMAL HIGH (ref 9–35)

## 2017-02-26 LAB — PROTIME-INR
INR: 1.88
Prothrombin Time: 21.9 seconds — ABNORMAL HIGH (ref 11.4–15.2)

## 2017-02-26 LAB — TROPONIN I
TROPONIN I: 0.03 ng/mL — AB (ref <0.03)
Troponin I: 0.04 ng/mL (ref <0.03)

## 2017-02-26 LAB — MRSA PCR SCREENING: MRSA by PCR: INVALID — AB

## 2017-02-26 MED ORDER — SODIUM CHLORIDE 0.9 % IV SOLN
INTRAVENOUS | Status: DC
  Administered 2017-02-26 – 2017-02-28 (×5): via INTRAVENOUS

## 2017-02-26 MED ORDER — WARFARIN SODIUM 5 MG PO TABS
5.0000 mg | ORAL_TABLET | Freq: Once | ORAL | Status: AC
  Administered 2017-02-26: 5 mg via ORAL
  Filled 2017-02-26: qty 1

## 2017-02-26 MED ORDER — WARFARIN SODIUM 5 MG PO TABS: 5.0000 mg | ORAL_TABLET | Freq: Once | ORAL | Status: DC

## 2017-02-26 MED ORDER — DEXTROSE 5 % IV SOLN
INTRAVENOUS | Status: DC
  Administered 2017-02-26 – 2017-02-27 (×2): via INTRAVENOUS
  Administered 2017-02-28: 1000 mL via INTRAVENOUS

## 2017-02-26 NOTE — Progress Notes (Signed)
Inpatient Diabetes Program Recommendations  AACE/ADA: New Consensus Statement on Inpatient Glycemic Control (2015)  Target Ranges:  Prepandial:   less than 140 mg/dL      Peak postprandial:   less than 180 mg/dL (1-2 hours)      Critically ill patients:  140 - 180 mg/dL  Results for Melvin Wang, Melvin Wang (MRN [id number]) as of 02/26/2017 08:00  Ref. Range 02/25/2017 13:50 02/25/2017 15:04 02/25/2017 16:13 02/25/2017 18:31 02/26/2017 00:02 02/26/2017 04:56 02/26/2017 07:29  Glucose-Capillary Latest Ref Range: 65 - 99 mg/dL 135 (H) 86 230 (H) 239 (H) 193 (H) 136 (H) 140 (H)  Results for Melvin Wang, Melvin Wang (MRN [id number]) as of 02/26/2017 08:00  Ref. Range 01/20/2017 08:01 01/20/2017 08:42 01/20/2017 11:17 01/20/2017 16:25 01/20/2017 21:12 01/21/2017 07:38 01/21/2017 11:16 01/21/2017 12:41 01/21/2017 16:03  Glucose-Capillary Latest Ref Range: 65 - 99 mg/dL 37 (LL) 80 78 86 102 (H) 75 72 92 111 (H)   Results for Melvin Wang, Melvin Wang (MRN [id number]) as of 02/26/2017 08:00  Ref. Range 01/19/2017 15:45  Hemoglobin A1C Latest Ref Range: 4.8 - 5.6 % 5.4    Review of Glycemic Control  Diabetes history: DM2 Outpatient Diabetes medications: Lantus 40 units QAM Current orders for Inpatient glycemic control: CBGs Q4  Inpatient Diabetes Program Recommendations:  Correction (SSI): Noted patient admitted with hypoglycemia which has now resolved. Please consider ordering CBGs and Novolog 0-9 units TID with meals and Novolog 0-5 units QHS. HgbA1C: A1C 5.4% on 01/19/17. Anticipate patient is having frequent hypoglycemia. Outpatient DM medications: Recommend decreasing outpatient Lantus dose by at least 50% and have patient follow up with PCP regarding DM control. IV fluids: Please be sure to re-evaluate need for dextrose in IV fluids.  NOTE: In reviewing the chart noted patient was an inpatient from 01/19/17 to 01/21/17 and was seen by Diabetes Coordinator on 01/20/17 (see note for details). Patient experienced hypoglycemia during  last hospitalization as well. Patient had reported he had been taking same Lantus dose of over 2 years, CBGs were trending 70-100's mg/dl at home, and he had reported he was not able to eat well. Per H&P patient has recently lost weight as well. With A1C of 5.4%, patient is likely experiencing frequent hypoglycemia. Recommend decreasing outpatient Lantus dose by at least 50% and have patient follow up with PCP.  Thanks, Barnie Alderman, RN, MSN, CDE Diabetes Coordinator Inpatient Diabetes Program [phone] (Team Pager from 8am to 5pm) '

## 2017-02-26 NOTE — Progress Notes (Signed)
RN on 300 called back for report about 2015. Will txr pt per order.  Mickle Campton Rica Mote, RN

## 2017-02-26 NOTE — Progress Notes (Signed)
PROGRESS NOTE    Melvin Wang  MVE:[id number] DOB: 12-29-1940 DOA: 02/25/2017 PCP: Claretta Fraise, MD     Brief Narrative:  76 year old man admitted to the hospital on 3/20 from home after found unresponsive, upon EMS arrival his CBG was 23. Sugars have responded to dextrose administration.   Assessment & Plan:   Principal Problem:   Hypoglycemia Active Problems:   Tremor, essential   Essential hypertension   Permanent atrial fibrillation (HCC)   Gout   Cirrhosis of liver with ascites (HCC)   Hyponatremia   Hypothermia   Hyperammonemia (HCC)   CKD (chronic kidney disease), stage III   Type 2 diabetes mellitus with renal complication (HCC)   Acute metabolic encephalopathy -Due to hypoglycemia. -Resolved.  Hypoglycemia -Patient has had recent significant weight loss of about 25 pounds, however his basal insulin dose has not changed, suspect this is the reason for his hypoglycemia. -On admission was placed on a D10 drip and his sugars have been stable, plan to wean down to D5 drip today and monitor another 24 hours before discontinuing dextrose altogether. -We'll need to determine whether it is safe to restart insulin or whether he will be discharged without.  Hyponatremia -Due to cirrhosis, worse than baseline, suspect in part due to some mild dehydration. -Recheck in a.m. after IV fluids.  Permanent atrial fibrillation -Rate controlled, anticoagulated on warfarin.  Cirrhosis of the liver -Continue lactulose.   DVT prophylaxis: Warfarin Code Status: Full code Family Communication: Wife and daughter at bedside updated on plan of care and questions answered Disposition Plan: Transfer to floor, hope for discharge home in 24-48 hours  Consultants:   None  Procedures:   None  Antimicrobials:  Anti-infectives    None       Subjective: Feels well, no current complaints.  Objective: Vitals:   02/26/17 1100 02/26/17 1101 02/26/17 1300 02/26/17 1627    BP:  98/62    Pulse: 85 92  89  Resp: (!) 21 (!) 22  18  Temp: 98.5 F (36.9 C) 98.5 F (36.9 C) 98.3 F (36.8 C) 97.9 F (36.6 C)  TempSrc:   Oral Oral  SpO2: 98% 96%  98%  Weight:      Height:        Intake/Output Summary (Last 24 hours) at 02/26/17 1803 Last data filed at 02/26/17 1729  Gross per 24 hour  Intake          1185.83 ml  Output             3251 ml  Net         -2065.17 ml   Filed Weights   02/25/17 1346 02/25/17 2107 02/26/17 0500  Weight: 99.8 kg (220 lb) 100.1 kg (220 lb 10.9 oz) 100.1 kg (220 lb 10.9 oz)    Examination:  General exam: Alert, awake, oriented x 3 Respiratory system: Clear to auscultation. Respiratory effort normal. Cardiovascular system:RRR. No murmurs, rubs, gallops. Gastrointestinal system: Abdomen is nondistended, soft and nontender. No organomegaly or masses felt. Normal bowel sounds heard. Central nervous system: Alert and oriented. No focal neurological deficits. Extremities: No C/C/E, +pedal pulses Skin: No rashes, lesions or ulcers Psychiatry: Judgement and insight appear normal. Mood & affect appropriate.     Data Reviewed: I have personally reviewed following labs and imaging studies  CBC:  Recent Labs Lab 02/25/17 1401 02/26/17 0446  WBC 12.5* 10.0  NEUTROABS  --  7.8*  HGB 14.4 12.3*  HCT 39.7 34.7*  MCV 96.6 96.7  PLT 136* 124*   Basic Metabolic Panel:  Recent Labs Lab 02/25/17 1401 02/25/17 1955 02/26/17 0446  NA 122* 122* 125*  K 4.7 5.2* 4.5  CL 88* 89* 94*  CO2 23 24 22   GLUCOSE 62* 176* 141*  BUN 90* 81* 70*  CREATININE 1.93* 1.67* 1.62*  CALCIUM 9.1 8.2* 8.3*   GFR: Estimated Creatinine Clearance: 50.5 mL/min (A) (by C-G formula based on SCr of 1.62 mg/dL (H)). Liver Function Tests:  Recent Labs Lab 02/25/17 1401 02/26/17 0446  AST 32 29  ALT 25 24  ALKPHOS 216* 183*  BILITOT 3.1* 3.4*  PROT 8.7* 7.6  ALBUMIN 3.8 3.3*   No results for input(s): LIPASE, AMYLASE in the last 168  hours.  Recent Labs Lab 02/25/17 1411 02/26/17 0446  AMMONIA 75* 48*   Coagulation Profile:  Recent Labs Lab 02/21/17 0953 02/25/17 1401 02/26/17 0446  INR 2.0* 1.71 1.88   Cardiac Enzymes:  Recent Labs Lab 02/25/17 1401 02/25/17 1955 02/25/17 2350 02/26/17 0446  TROPONINI 0.04* 0.03* 0.03* 0.04*   BNP (last 3 results) No results for input(s): PROBNP in the last 8760 hours. HbA1C: No results for input(s): HGBA1C in the last 72 hours. CBG:  Recent Labs Lab 02/26/17 0002 02/26/17 0456 02/26/17 0729 02/26/17 1104 02/26/17 1626  GLUCAP 193* 136* 140* 188* 96   Lipid Profile: No results for input(s): CHOL, HDL, LDLCALC, TRIG, CHOLHDL, LDLDIRECT in the last 72 hours. Thyroid Function Tests: No results for input(s): TSH, T4TOTAL, FREET4, T3FREE, THYROIDAB in the last 72 hours. Anemia Panel: No results for input(s): VITAMINB12, FOLATE, FERRITIN, TIBC, IRON, RETICCTPCT in the last 72 hours. Urine analysis:    Component Value Date/Time   COLORURINE YELLOW 02/25/2017 [place] 02/25/2017 1355   APPEARANCEUR Clear 09/28/2013 0845   LABSPEC 1.010 02/25/2017 1355   PHURINE 5.0 02/25/2017 1355   GLUCOSEU 50 (A) 02/25/2017 1355   HGBUR NEGATIVE 02/25/2017 1355   BILIRUBINUR NEGATIVE 02/25/2017 1355   BILIRUBINUR neg 12/07/2015 0939   BILIRUBINUR Negative 09/28/2013 0845   KETONESUR NEGATIVE 02/25/2017 1355   PROTEINUR NEGATIVE 02/25/2017 1355   UROBILINOGEN negative 12/07/2015 0939   UROBILINOGEN 0.2 02/19/2014 1612   NITRITE NEGATIVE 02/25/2017 1355   LEUKOCYTESUR NEGATIVE 02/25/2017 1355   LEUKOCYTESUR Negative 09/28/2013 0845   Sepsis Labs: @LABRCNTIP (procalcitonin:4,lacticidven:4)  ) Recent Results (from the past 240 hour(s))  MRSA PCR Screening     Status: Abnormal   Collection Time: 02/26/17  5:20 AM  Result Value Ref Range Status   MRSA by PCR INVALID RESULTS, SPECIMEN SENT FOR CULTURE (A) NEGATIVE Final    Comment: RESULT CALLED  TO, READ BACK BY AND VERIFIED WITH: PHILLIPS C. AT [place] [id number] BY THOMPSON S.          Radiology Studies: Dg Chest Port 1 View  Result Date: 02/25/2017 CLINICAL DATA:  Found unresponsive this morning, mental status changes EXAM: PORTABLE CHEST 1 VIEW COMPARISON:  01/19/2017 FINDINGS: Cardiomegaly again noted. Central mild vascular congestion without convincing pulmonary edema. Elevation of the right hemidiaphragm again noted. Mild left basilar atelectasis. IMPRESSION: Central mild vascular congestion without convincing pulmonary edema. Elevation of the right hemidiaphragm again noted. Mild left basilar atelectasis. Electronically Signed   By: Lahoma Crocker M.D.   On: 02/25/2017 14:25        Scheduled Meds: . calcitRIOL  0.5 mcg Oral Daily  . febuxostat  40 mg Oral Daily  . lactulose  20 g Oral BID  . pantoprazole  40 mg  Oral BID AC  . propranolol  40 mg Oral BID  . spironolactone  50 mg Oral Q breakfast  . Warfarin - Pharmacist Dosing Inpatient   Does not apply Q24H   Continuous Infusions: . sodium chloride 75 mL/hr at 02/26/17 1600  . dextrose 50 mL/hr at 02/26/17 1600     LOS: 1 day    Time spent: 25 minutes. Greater than 50% of this time was spent in direct contact with the patient coordinating care.     Lelon Frohlich, MD [facility] Pager [phone]  If 7PM-7AM, please contact night-coverage [email or web address] Password [facility] 02/26/2017, 6:03 PM

## 2017-02-26 NOTE — Progress Notes (Signed)
Called at 69. Attempted to give report. RN to call me back soon.  Ericka Pontiff, RN 8:16 PM 02/26/17

## 2017-02-26 NOTE — Progress Notes (Signed)
Melvin Wang for Warfarin Indication: atrial fibrillation  Allergies  Allergen Reactions  . Acyclovir And Related Hives  . Metformin And Related Diarrhea  . Amoxicillin Hives and Other (See Comments)    Has patient had a PCN reaction causing immediate rash, facial/tongue/throat swelling, SOB or lightheadedness with hypotension: No Has patient had a PCN reaction causing severe rash involving mucus membranes or skin necrosis: No Has patient had a PCN reaction that required hospitalization No Has patient had a PCN reaction occurring within the last 10 years: No If all of the above answers are "NO", then may proceed with Cephalosporin use.  . Augmentin [Amoxicillin-Pot Clavulanate] Hives and Other (See Comments)    Has patient had a PCN reaction causing immediate rash, facial/tongue/throat swelling, SOB or lightheadedness with hypotension: No Has patient had a PCN reaction causing severe rash involving mucus membranes or skin necrosis: No Has patient had a PCN reaction that required hospitalization No Has patient had a PCN reaction occurring within the last 10 years: No If all of the above answers are "NO", then may proceed with Cephalosporin use.  Melvin Wang [Trandolapril] Other (See Comments)    Reaction:  Caused renal problems    Patient Measurements: Height: 6\' 1"  (185.4 cm) Weight: 220 lb 10.9 oz (100.1 kg) IBW/kg (Calculated) : 79.9  Vital Signs: Temp: 97.5 F (36.4 C) (03/21 0800) BP: 96/56 (03/21 0800) Pulse Rate: 89 (03/21 0800)  Labs:  Recent Labs  02/25/17 1401 02/25/17 1955 02/25/17 2350 02/26/17 0446  HGB 14.4  --   --  12.3*  HCT 39.7  --   --  34.7*  PLT 136*  --   --  111*  LABPROT 20.3*  --   --  21.9*  INR 1.71  --   --  1.88  CREATININE 1.93* 1.67*  --  1.62*  TROPONINI 0.04* 0.03* 0.03* 0.04*    Estimated Creatinine Clearance: 50.5 mL/min (A) (by C-G formula based on SCr of 1.62 mg/dL (H)).   Medical History: Past  Medical History:  Diagnosis Date  . Abnormality of gait   . Cataract   . Cirrhosis of liver with ascites ([place]) 01/19/2017  . CKD (chronic kidney disease), stage III    renal insufficiency, sees nephrologist: [name]  . Diabetes mellitus ([place])   . Essential tremor   . Foot ulcer, right ([place]) 03/2013  . Gout   . Hyperlipidemia   . Hypertension   . Permanent atrial fibrillation (HCC)    a. >20 years, prior DCCVs unsuccessful, INR followed by PCP.    Medications:  Prescriptions Prior to Admission  Medication Sig Dispense Refill Last Dose  . allopurinol (ZYLOPRIM) 300 MG tablet Take 300 mg by mouth daily.   02/25/2017 at Unknown time  . calcitRIOL (ROCALTROL) 0.5 MCG capsule Take 0.5 mcg by mouth daily.   02/25/2017 at Unknown time  . febuxostat (ULORIC) 40 MG tablet Take 1 tablet (40 mg total) by mouth daily. 30 tablet 5 02/25/2017 at Unknown time  . lactulose (CHRONULAC) 10 GM/15ML solution Take 30 mLs (20 g total) by mouth 2 (two) times daily. 1892 mL 5 02/25/2017 at Unknown time  . LANTUS SOLOSTAR 100 UNIT/ML Solostar Pen INJECT 40 UNITS INTO THE SKIN EVERY MORNING. 15 mL 2 02/25/2017 at Unknown time  . propranolol (INDERAL) 40 MG tablet Take 1 tablet (40 mg total) by mouth 2 (two) times daily. 60 tablet 11 02/25/2017 at 0800  . spironolactone (ALDACTONE) 50 MG tablet Take 1  tablet (50 mg total) by mouth daily with breakfast. 30 tablet 5 02/25/2017 at Unknown time  . torsemide (DEMADEX) 20 MG tablet 2 each morning and one in afternoon (Patient taking differently: Take 20-40 mg by mouth 2 (two) times daily. Pt takes two tablets in the morning and one in the evening.) 90 tablet 3 02/25/2017 at Unknown time  . warfarin (COUMADIN) 10 MG tablet Take 5 mg by mouth See admin instructions. Pt takes every evening on Sunday, Tuesday, Wednesday, Thursday, and Saturday.   02/23/2017 at 1800  . pantoprazole (PROTONIX) 40 MG tablet Take 1 tablet (40 mg total) by mouth 2 (two) times daily before a meal.  (Patient not taking: Reported on 02/25/2017) 60 tablet 0 Not Taking at Unknown time    Assessment: Okay for Protocol, INR below goal.  Slight drop in H/H and PLTC.  Goal of Therapy:  INR 2-3   Plan:  Repeat Warfarin 5mg  PO x 1  Daily PT/INR Monitor for signs and symptoms of bleeding.   Pricilla Larsson 02/26/2017,9:03 AM

## 2017-02-26 NOTE — Care Management Note (Signed)
Case Management Note  Patient Details  Name: PURCELL JUNGBLUTH MRN: [id number] Date of Birth: 11/07/1941  Subjective/Objective:  Adm with Hypoglycemic event. He is from home with wife, ind with ADL's. Has a PCP, drives to appointments, reports no issues affording medications. No HH PTA. Declines HH services.    Action/Plan: Anticipate DC home with self care.   Expected Discharge Date:     02/27/2017             Expected Discharge Plan:  Home/Self Care  In-House Referral:     Discharge planning Services  CM Consult  Post Acute Care Choice:  NA Choice offered to:  NA  DME Arranged:    DME Agency:     HH Arranged:    HH Agency:     Status of Service:  Completed, signed off  If discussed at [employer] of Stay Meetings, dates discussed:    Additional Comments:  Zhi Geier, Chauncey Reading, RN 02/26/2017, 2:47 PM

## 2017-02-27 ENCOUNTER — Ambulatory Visit: Payer: Medicare Other | Admitting: Family Medicine

## 2017-02-27 ENCOUNTER — Encounter (HOSPITAL_COMMUNITY): Payer: Self-pay | Admitting: Cardiology

## 2017-02-27 DIAGNOSIS — N183 Chronic kidney disease, stage 3 (moderate): Secondary | ICD-10-CM

## 2017-02-27 DIAGNOSIS — I482 Chronic atrial fibrillation: Secondary | ICD-10-CM

## 2017-02-27 DIAGNOSIS — E162 Hypoglycemia, unspecified: Secondary | ICD-10-CM

## 2017-02-27 DIAGNOSIS — E1122 Type 2 diabetes mellitus with diabetic chronic kidney disease: Secondary | ICD-10-CM

## 2017-02-27 DIAGNOSIS — E871 Hypo-osmolality and hyponatremia: Secondary | ICD-10-CM

## 2017-02-27 DIAGNOSIS — E440 Moderate protein-calorie malnutrition: Secondary | ICD-10-CM

## 2017-02-27 DIAGNOSIS — K7460 Unspecified cirrhosis of liver: Secondary | ICD-10-CM

## 2017-02-27 DIAGNOSIS — I200 Unstable angina: Secondary | ICD-10-CM

## 2017-02-27 DIAGNOSIS — I509 Heart failure, unspecified: Secondary | ICD-10-CM

## 2017-02-27 DIAGNOSIS — R0789 Other chest pain: Secondary | ICD-10-CM

## 2017-02-27 DIAGNOSIS — I2721 Secondary pulmonary arterial hypertension: Secondary | ICD-10-CM

## 2017-02-27 LAB — GLUCOSE, CAPILLARY
GLUCOSE-CAPILLARY: 108 mg/dL — AB (ref 65–99)
GLUCOSE-CAPILLARY: 120 mg/dL — AB (ref 65–99)
GLUCOSE-CAPILLARY: 78 mg/dL (ref 65–99)
Glucose-Capillary: 105 mg/dL — ABNORMAL HIGH (ref 65–99)
Glucose-Capillary: 128 mg/dL — ABNORMAL HIGH (ref 65–99)
Glucose-Capillary: 86 mg/dL (ref 65–99)
Glucose-Capillary: 95 mg/dL (ref 65–99)

## 2017-02-27 LAB — COMPREHENSIVE METABOLIC PANEL
ALK PHOS: 186 U/L — AB (ref 38–126)
ALT: 24 U/L (ref 17–63)
AST: 31 U/L (ref 15–41)
Albumin: 3.3 g/dL — ABNORMAL LOW (ref 3.5–5.0)
Anion gap: 7 (ref 5–15)
BUN: 49 mg/dL — AB (ref 6–20)
CHLORIDE: 98 mmol/L — AB (ref 101–111)
CO2: 22 mmol/L (ref 22–32)
Calcium: 8.6 mg/dL — ABNORMAL LOW (ref 8.9–10.3)
Creatinine, Ser: 1.34 mg/dL — ABNORMAL HIGH (ref 0.61–1.24)
GFR calc Af Amer: 59 mL/min — ABNORMAL LOW (ref >60)
GFR, EST NON AFRICAN AMERICAN: 51 mL/min — AB (ref >60)
Glucose, Bld: 110 mg/dL — ABNORMAL HIGH (ref 65–99)
POTASSIUM: 5.2 mmol/L — AB (ref 3.5–5.1)
Sodium: 127 mmol/L — ABNORMAL LOW (ref 135–145)
Total Bilirubin: 3.2 mg/dL — ABNORMAL HIGH (ref 0.3–1.2)
Total Protein: 7.8 g/dL (ref 6.5–8.1)

## 2017-02-27 LAB — CBC
HEMATOCRIT: 37.5 % — AB (ref 39.0–52.0)
Hemoglobin: 13.1 g/dL (ref 13.0–17.0)
MCH: 34.7 pg — AB (ref 26.0–34.0)
MCHC: 34.9 g/dL (ref 30.0–36.0)
MCV: 99.5 fL (ref 78.0–100.0)
Platelets: 104 10*3/uL — ABNORMAL LOW (ref 150–400)
RBC: 3.77 MIL/uL — AB (ref 4.22–5.81)
RDW: 16 % — ABNORMAL HIGH (ref 11.5–15.5)
WBC: 10.5 10*3/uL (ref 4.0–10.5)

## 2017-02-27 LAB — TROPONIN I
Troponin I: 0.03 ng/mL (ref <0.03)
Troponin I: 0.03 ng/mL (ref <0.03)
Troponin I: 0.03 ng/mL (ref <0.03)

## 2017-02-27 LAB — PROTIME-INR
INR: 2.31
Prothrombin Time: 25.8 seconds — ABNORMAL HIGH (ref 11.4–15.2)

## 2017-02-27 MED ORDER — ASPIRIN 325 MG PO TABS
325.0000 mg | ORAL_TABLET | Freq: Once | ORAL | Status: AC
  Administered 2017-02-27: 325 mg via ORAL
  Filled 2017-02-27: qty 1

## 2017-02-27 MED ORDER — SODIUM CHLORIDE 0.9 % IV BOLUS (SEPSIS)
1000.0000 mL | Freq: Once | INTRAVENOUS | Status: AC
  Administered 2017-02-27: 1000 mL via INTRAVENOUS

## 2017-02-27 MED ORDER — GI COCKTAIL ~~LOC~~
30.0000 mL | ORAL | Status: AC
  Administered 2017-02-27: 30 mL via ORAL
  Filled 2017-02-27: qty 30

## 2017-02-27 MED ORDER — METOPROLOL TARTRATE 5 MG/5ML IV SOLN
2.5000 mg | Freq: Four times a day (QID) | INTRAVENOUS | Status: DC
  Administered 2017-02-27 (×2): 2.5 mg via INTRAVENOUS
  Filled 2017-02-27 (×2): qty 5

## 2017-02-27 MED ORDER — DIGOXIN 0.25 MG/ML IJ SOLN
0.5000 mg | Freq: Once | INTRAMUSCULAR | Status: AC
  Administered 2017-02-27: 0.5 mg via INTRAVENOUS
  Filled 2017-02-27: qty 2

## 2017-02-27 MED ORDER — METOPROLOL TARTRATE 5 MG/5ML IV SOLN
2.5000 mg | Freq: Once | INTRAVENOUS | Status: AC
Start: 2017-02-27 — End: 2017-02-27
  Administered 2017-02-27: 2.5 mg via INTRAVENOUS
  Filled 2017-02-27: qty 5

## 2017-02-27 NOTE — Progress Notes (Signed)
   Progress Note  Patient Name: BENNET KUJAWA Date of Encounter: 02/27/2017  Patient awaits transfer to stepdown at [facility]. Informed by nursing that heart rate intermittently elevated in atrial fibrillation. He is currently not on any heart rate control medications with recent relative hypotension, although blood pressure has stabilized with fluids. He was on Propranolol as an outpatient. Adding IV Lopressor for the time being.  Signed, Rozann Lesches, MD  02/27/2017, 3:52 PM

## 2017-02-27 NOTE — Progress Notes (Signed)
Dr. Marin Comment coming to see patient.

## 2017-02-27 NOTE — Progress Notes (Signed)
[place] for Warfarin Indication: atrial fibrillation  Allergies  Allergen Reactions  . Acyclovir And Related Hives  . Metformin And Related Diarrhea  . Amoxicillin Hives and Other (See Comments)    Has patient had a PCN reaction causing immediate rash, facial/tongue/throat swelling, SOB or lightheadedness with hypotension: No Has patient had a PCN reaction causing severe rash involving mucus membranes or skin necrosis: No Has patient had a PCN reaction that required hospitalization No Has patient had a PCN reaction occurring within the last 10 years: No If all of the above answers are "NO", then may proceed with Cephalosporin use.  . Augmentin [Amoxicillin-Pot Clavulanate] Hives and Other (See Comments)    Has patient had a PCN reaction causing immediate rash, facial/tongue/throat swelling, SOB or lightheadedness with hypotension: No Has patient had a PCN reaction causing severe rash involving mucus membranes or skin necrosis: No Has patient had a PCN reaction that required hospitalization No Has patient had a PCN reaction occurring within the last 10 years: No If all of the above answers are "NO", then may proceed with Cephalosporin use.  [name] [Trandolapril] Other (See Comments)    Reaction:  Caused renal problems    Patient Measurements: Height: 6\' 1"  (185.4 cm) Weight: 219 lb 12.8 oz (99.7 kg) IBW/kg (Calculated) : 79.9  Vital Signs: Temp: 97.5 F (36.4 C) (03/22 0800) Temp Source: Oral (03/22 0551) BP: 94/70 (03/22 0551) Pulse Rate: 91 (03/22 0551)  Labs:  Recent Labs  02/25/17 1401 02/25/17 1955 02/25/17 2350 02/26/17 0446 02/27/17 0550  HGB 14.4  --   --  12.3* 13.1  HCT 39.7  --   --  34.7* 37.5*  PLT 136*  --   --  111* 104*  LABPROT 20.3*  --   --  21.9* 25.8*  INR 1.71  --   --  1.88 2.31  CREATININE 1.93* 1.67*  --  1.62* 1.34*  TROPONINI 0.04* 0.03* 0.03* 0.04* 0.03*    Estimated Creatinine Clearance: 61  mL/min (A) (by C-G formula based on SCr of 1.34 mg/dL (H)).   Medical History: Past Medical History:  Diagnosis Date  . Abnormality of gait   . Cataract   . Cirrhosis of liver with ascites ([place]) 01/19/2017  . CKD (chronic kidney disease), stage III    [facility]  . Cor pulmonale ([place])   . Essential hypertension   . Essential tremor   . Foot ulcer, right ([place]) 03/2013  . Gout   . Hyperlipidemia   . Permanent atrial fibrillation (HCC)    a. >20 years, prior DCCVs unsuccessful, INR followed by PCP.  Marland Kitchen Type 2 diabetes mellitus (HCC)     Medications:  Prescriptions Prior to Admission  Medication Sig Dispense Refill Last Dose  . allopurinol (ZYLOPRIM) 300 MG tablet Take 300 mg by mouth daily.   02/25/2017 at Unknown time  . calcitRIOL (ROCALTROL) 0.5 MCG capsule Take 0.5 mcg by mouth daily.   02/25/2017 at Unknown time  . febuxostat (ULORIC) 40 MG tablet Take 1 tablet (40 mg total) by mouth daily. 30 tablet 5 02/25/2017 at Unknown time  . lactulose (CHRONULAC) 10 GM/15ML solution Take 30 mLs (20 g total) by mouth 2 (two) times daily. 1892 mL 5 02/25/2017 at Unknown time  . LANTUS SOLOSTAR 100 UNIT/ML Solostar Pen INJECT 40 UNITS INTO THE SKIN EVERY MORNING. 15 mL 2 02/25/2017 at Unknown time  . propranolol (INDERAL) 40 MG tablet Take 1 tablet (40 mg total) by mouth  2 (two) times daily. 60 tablet 11 02/25/2017 at 0800  . spironolactone (ALDACTONE) 50 MG tablet Take 1 tablet (50 mg total) by mouth daily with breakfast. 30 tablet 5 02/25/2017 at Unknown time  . torsemide (DEMADEX) 20 MG tablet 2 each morning and one in afternoon (Patient taking differently: Take 20-40 mg by mouth 2 (two) times daily. Pt takes two tablets in the morning and one in the evening.) 90 tablet 3 02/25/2017 at Unknown time  . warfarin (COUMADIN) 10 MG tablet Take 5 mg by mouth See admin instructions. Pt takes every evening on Sunday, Tuesday, Wednesday, Thursday, and Saturday.   02/23/2017 at 1800  . pantoprazole  (PROTONIX) 40 MG tablet Take 1 tablet (40 mg total) by mouth 2 (two) times daily before a meal. (Patient not taking: Reported on 02/25/2017) 60 tablet 0 Not Taking at Unknown time    Assessment: Okay for Protocol, INR @ goal.    Goal of Therapy:  INR 2-3   Plan:  Transfer to [facility] ICU for further evaluation and treatment. Will be on hospitalist service. Will hold Coumadin for potential cardiac catheterization. Daily PT/INR Monitor for signs and symptoms of bleeding.   Biagio Quint R 02/27/2017,9:58 AM

## 2017-02-27 NOTE — Progress Notes (Signed)
Per tele, patient's HR has increased to 120s-150s multiple times and sustains for a short time, then back down to low 100s. Patient asymptomatic. Patient states that this is his norm. Heart rhythm chronic A-fib. Patient states that he feels like his gout is starting to flare. Patient takes Uloric at home. Patient does not want pain medication at this time. Wife in room with patient at this time. Will continue to monitor.

## 2017-02-27 NOTE — Progress Notes (Signed)
PROGRESS NOTE    Melvin Wang  ZOX:[id number] DOB: 1941/02/03 DOA: 02/25/2017 PCP: Claretta Fraise, MD     Brief Narrative:  76 year old man admitted to the hospital on 3/20 from home after found unresponsive, upon EMS arrival his CBG was 23. Sugars have responded to dextrose administration. Overnight events of 3/21-3/22 noted. Patient developed substernal chest pain with lateral changes on EKG. Has been seen by cardiology and is currently awaiting transfer to [facility] for potential cardiac catheterization.   Assessment & Plan:   Principal Problem:   Hypoglycemia Active Problems:   Tremor, essential   Essential hypertension   Permanent atrial fibrillation (HCC)   Gout   Cirrhosis of liver with ascites (HCC)   Hyponatremia   Hypothermia   Hyperammonemia (HCC)   CKD (chronic kidney disease), stage III   Type 2 diabetes mellitus with renal complication (HCC)   Acute metabolic encephalopathy -Due to hypoglycemia. -Resolved.  Hypoglycemia -Patient has had recent significant weight loss of about 25 pounds, however his basal insulin dose has not changed, suspect this is the reason for his hypoglycemia. -CBGs remain in the 70-108 range on D5. Given potential for cardiac catheterization soon and nothing by mouth state, will leave on D5 for now. -We'll need to determine whether it is safe to restart insulin or whether he will be discharged without.  Substernal chest pain/unstable angina -With nonspecific ST changes laterally on EKG. -Seen by cardiology, they have recommended transfer to [facility] for potential cardiac catheterization. -Troponin is mildly elevated but flat 0.032 0.04.  Hyponatremia -Due to cirrhosis, worse than baseline, suspect in part due to some mild dehydration. -Improved with IV fluids  Permanent atrial fibrillation -Rate controlled, warfarin remains on hold due to potential of cardiac catheterization.  Cirrhosis of the liver -Continue  lactulose.   DVT prophylaxis: Warfarin Code Status: Full code Family Communication: Wife and daughter at bedside updated on plan of care and questions answered Disposition Plan: Transfer to [facility]  Consultants:   None  Procedures:   None  Antimicrobials:  Anti-infectives    None       Subjective: Chest pain still present, substernal, he does state that it is improved from this morning.  Objective: Vitals:   02/27/17 0552 02/27/17 0800 02/27/17 1123 02/27/17 1605  BP:      Pulse:   (!) 107 (!) 111  Resp:   (!) 22 (!) 26  Temp:  97.5 F (36.4 C) 97.3 F (36.3 C) 98.2 F (36.8 C)  TempSrc:   Oral Oral  SpO2: 100%  97% 97%  Weight:  99.7 kg (219 lb 12.8 oz)    Height:  6\' 1"  (1.854 m)      Intake/Output Summary (Last 24 hours) at 02/27/17 1606 Last data filed at 02/27/17 1558  Gross per 24 hour  Intake          2645.42 ml  Output             2500 ml  Net           145.42 ml   Filed Weights   02/26/17 0500 02/26/17 2209 02/27/17 0800  Weight: 100.1 kg (220 lb 10.9 oz) 100.2 kg (220 lb 14.4 oz) 99.7 kg (219 lb 12.8 oz)    Examination:  General exam: Alert, awake, oriented x 3 Respiratory system: Clear to auscultation. Respiratory effort normal. Cardiovascular system:RRR. No murmurs, rubs, gallops. Gastrointestinal system: Abdomen is nondistended, soft and nontender. No organomegaly or masses felt. Normal bowel  sounds heard. Central nervous system: Alert and oriented. No focal neurological deficits. Extremities: No C/C/E, +pedal pulses Skin: No rashes, lesions or ulcers Psychiatry: Judgement and insight appear normal. Mood & affect appropriate.     Data Reviewed: I have personally reviewed following labs and imaging studies  CBC:  Recent Labs Lab 02/25/17 1401 02/26/17 0446 02/27/17 0550  WBC 12.5* 10.0 10.5  NEUTROABS  --  7.8*  --   HGB 14.4 12.3* 13.1  HCT 39.7 34.7* 37.5*  MCV 96.6 96.7 99.5  PLT 136* 111* 104*   Basic  Metabolic Panel:  Recent Labs Lab 02/25/17 1401 02/25/17 1955 02/26/17 0446 02/27/17 0550  NA 122* 122* 125* 127*  K 4.7 5.2* 4.5 5.2*  CL 88* 89* 94* 98*  CO2 23 24 22 22   GLUCOSE 62* 176* 141* 110*  BUN 90* 81* 70* 49*  CREATININE 1.93* 1.67* 1.62* 1.34*  CALCIUM 9.1 8.2* 8.3* 8.6*   GFR: Estimated Creatinine Clearance: 61 mL/min (A) (by C-G formula based on SCr of 1.34 mg/dL (H)). Liver Function Tests:  Recent Labs Lab 02/25/17 1401 02/26/17 0446 02/27/17 0550  AST 32 29 31  ALT 25 24 24   ALKPHOS 216* 183* 186*  BILITOT 3.1* 3.4* 3.2*  PROT 8.7* 7.6 7.8  ALBUMIN 3.8 3.3* 3.3*   No results for input(s): LIPASE, AMYLASE in the last 168 hours.  Recent Labs Lab 02/25/17 1411 02/26/17 0446  AMMONIA 75* 48*   Coagulation Profile:  Recent Labs Lab 02/21/17 0953 02/25/17 1401 02/26/17 0446 02/27/17 0550  INR 2.0* 1.71 1.88 2.31   Cardiac Enzymes:  Recent Labs Lab 02/25/17 1955 02/25/17 2350 02/26/17 0446 02/27/17 0550 02/27/17 1226  TROPONINI 0.03* 0.03* 0.04* 0.03* 0.03*   BNP (last 3 results) No results for input(s): PROBNP in the last 8760 hours. HbA1C: No results for input(s): HGBA1C in the last 72 hours. CBG:  Recent Labs Lab 02/27/17 0029 02/27/17 0431 02/27/17 0854 02/27/17 1124 02/27/17 1603  GLUCAP 120* 108* 105* 78 86   Lipid Profile: No results for input(s): CHOL, HDL, LDLCALC, TRIG, CHOLHDL, LDLDIRECT in the last 72 hours. Thyroid Function Tests: No results for input(s): TSH, T4TOTAL, FREET4, T3FREE, THYROIDAB in the last 72 hours. Anemia Panel: No results for input(s): VITAMINB12, FOLATE, FERRITIN, TIBC, IRON, RETICCTPCT in the last 72 hours. Urine analysis:    Component Value Date/Time   COLORURINE YELLOW 02/25/2017 [place] 02/25/2017 1355   APPEARANCEUR Clear 09/28/2013 0845   LABSPEC 1.010 02/25/2017 1355   PHURINE 5.0 02/25/2017 1355   GLUCOSEU 50 (A) 02/25/2017 1355   HGBUR NEGATIVE 02/25/2017  1355   BILIRUBINUR NEGATIVE 02/25/2017 1355   BILIRUBINUR neg 12/07/2015 0939   BILIRUBINUR Negative 09/28/2013 0845   KETONESUR NEGATIVE 02/25/2017 1355   PROTEINUR NEGATIVE 02/25/2017 1355   UROBILINOGEN negative 12/07/2015 0939   UROBILINOGEN 0.2 02/19/2014 1612   NITRITE NEGATIVE 02/25/2017 1355   LEUKOCYTESUR NEGATIVE 02/25/2017 1355   LEUKOCYTESUR Negative 09/28/2013 0845   Sepsis Labs: @LABRCNTIP (procalcitonin:4,lacticidven:4)  ) Recent Results (from the past 240 hour(s))  MRSA PCR Screening     Status: Abnormal   Collection Time: 02/26/17  5:20 AM  Result Value Ref Range Status   MRSA by PCR INVALID RESULTS, SPECIMEN SENT FOR CULTURE (A) NEGATIVE Final    Comment: RESULT CALLED TO, READ BACK BY AND VERIFIED WITH: PHILLIPS C. AT [id number] ON [id number] BY THOMPSON S.   MRSA culture     Status: None (Preliminary result)   Collection Time: 02/26/17  5:20 AM  Result Value Ref Range Status   Specimen Description NOSE  Final   Special Requests NONE  Final   Culture   Final    CULTURE REINCUBATED FOR BETTER GROWTH Performed at [facility], [address], [place], [place] [address]    Report Status PENDING  Incomplete         Radiology Studies: No results found.      Scheduled Meds: . calcitRIOL  0.5 mcg Oral Daily  . febuxostat  40 mg Oral Daily  . lactulose  20 g Oral BID  . metoprolol  2.5 mg Intravenous Q6H  . pantoprazole  40 mg Oral BID AC   Continuous Infusions: . sodium chloride 75 mL/hr at 02/27/17 1402  . dextrose 50 mL/hr at 02/27/17 1402     LOS: 2 days    Time spent: 25 minutes. Greater than 50% of this time was spent in direct contact with the patient coordinating care.     Lelon Frohlich, MD [facility] Pager [phone]  If 7PM-7AM, please contact night-coverage [email or web address] Password [facility] 02/27/2017, 4:06 PM

## 2017-02-27 NOTE — Consult Note (Signed)
Cardiology Consultation   Patient ID: Melvin Wang; [id number]; [birth year]   Admit date: 02/25/2017 Date of Consult: 02/27/2017  Referring MD:  Dr. Jerilee Hoh Cardiologist: Dr. Harl Bowie Consulting Cardiologist: Dr. Domenic Polite  Patient Care Team: Claretta Fraise, MD as PCP - General (Family Medicine) Steffanie Rainwater, DPM as Consulting Physician (Podiatry) Truc Manus Gunning, OD as Consulting Physician (Optometry) Curt Bears, MD as Consulting Physician (Oncology) Mauricia Area, MD as Consulting Physician (Nephrology) Daneil Dolin, MD as Consulting Physician (Gastroenterology)    Reason for Consultation:  Chest pain   History of Present Illness: Melvin Wang is a 76 y.o. male from [place], seen by Dr. Harl Bowie when hospitalized 01/2017. He has a history of diabetes, hypertension, hyperlipidemia, chronic kidney disease, essential tremor, and chronic atrial fibrillation. 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 He was placed on diuretics and was also found to have evidence of cor pulmonale with RV dysfunction in the face of normal LVEF and concerns about underestimation of PA pressures with plan to be evaluated in the heart failure clinic as an outpatient (saw Ms. Lawrence NP in the office).  Echocardiogram in February revealed mild LVH with an EF of 55-60%, hypokinesis of the basal mid anterior myocardium, mild MR, severe LA and RAE, severe RVE with low normal systolic function, mild to moderate TR, large left pleural effusion, elevated CVP, mild PR with inability to estimate PASP. Placed on Demadex 60 mg BID, spironolactone 100 mg daily, propanolol increased for rate control.CHADSVASC=4 on Coumadin. Patient states that he has had continued improvement in volume status since discharge on diuretics and was stable at his most recent office visit.  Yesterday patient  admitted with severe hypoglycemia CBG 23. Wife was out and when she came home he was unconscious. 911 walked her through CPR and she gave some compressions and he developed shallow breathing. EMS didn't give CPR and he didn't require intubation.  Was transferred to floor last night.  Early am developed sudden onset indigestion like pain and chest tightness. Also hypotensive with rapid AF rates. No NTG or Morphine given. Was seen by hospitalist and transferred back to the unit. Easing a little but still uncomfortable. Never had this pain before. Goes to gym daily and walks without chest pain. Was there on Friday. ECG afib with RBBB and LAFB with new ST T wave changes laterally. K 5.2. Being treated with Dig and IV fluids. Has had significant weight loss of 25 lbs without adjustment in insulin. Receiving fluids at 500 cc/hr. INR 2.31 Crt 1/34 Na 127.   Past Medical History:  Diagnosis Date  . Abnormality of gait   . Cataract   . Cirrhosis of liver with ascites ([place]) 01/19/2017  . CKD (chronic kidney disease), stage III     Kidney  . Cor pulmonale (HCC)    Possible  . Essential hypertension   . Essential tremor   . Foot ulcer, right ([place]) 03/2013  . Gout   . Hyperlipidemia   . Permanent atrial fibrillation (HCC)    a. >20 years, prior DCCVs unsuccessful, INR followed by PCP.  Melvin Wang Type 2 diabetes mellitus ([place])     Past Surgical History:  Procedure Laterality Date  . CATARACT EXTRACTION W/PHACO Left 04/19/2013   Procedure: CATARACT EXTRACTION PHACO AND INTRAOCULAR LENS PLACEMENT (IOC);  Surgeon: Tonny Branch, MD;  Location: [facility];  Service: Ophthalmology;  Laterality: Left;  CDE 29.30  .  CATARACT EXTRACTION W/PHACO Right 07/15/2013   Procedure: CATARACT EXTRACTION PHACO AND INTRAOCULAR LENS PLACEMENT (IOC);  Surgeon: Tonny Branch, MD;  Location: [facility];  Service: Ophthalmology;  Laterality: Right;  CDE:12.84  . ESOPHAGOGASTRODUODENOSCOPY N/A 01/21/2017   Dr. Oneida Alar: mild erosive gastritis  and moderate erosive duodenitis (negative H.pylori). Multiple gastric polyps.      Home Meds: Prior to Admission medications   Medication Sig Start Date End Date Taking? Authorizing Provider  allopurinol (ZYLOPRIM) 300 MG tablet Take 300 mg by mouth daily.   Yes Historical Provider, MD  calcitRIOL (ROCALTROL) 0.5 MCG capsule Take 0.5 mcg by mouth daily.   Yes Historical Provider, MD  febuxostat (ULORIC) 40 MG tablet Take 1 tablet (40 mg total) by mouth daily. 02/21/17  Yes Claretta Fraise, MD  lactulose (CHRONULAC) 10 GM/15ML solution Take 30 mLs (20 g total) by mouth 2 (two) times daily. 02/21/17  Yes Claretta Fraise, MD  LANTUS SOLOSTAR 100 UNIT/ML Solostar Pen INJECT 40 UNITS INTO THE SKIN EVERY MORNING. 01/27/17  Yes Claretta Fraise, MD  propranolol (INDERAL) 40 MG tablet Take 1 tablet (40 mg total) by mouth 2 (two) times daily. 01/28/17  Yes Lendon Colonel, NP  spironolactone (ALDACTONE) 50 MG tablet Take 1 tablet (50 mg total) by mouth daily with breakfast. 02/21/17  Yes Claretta Fraise, MD  torsemide (DEMADEX) 20 MG tablet 2 each morning and one in afternoon Patient taking differently: Take 20-40 mg by mouth 2 (two) times daily. Pt takes two tablets in the morning and one in the evening. 02/21/17  Yes Claretta Fraise, MD  warfarin (COUMADIN) 10 MG tablet Take 5 mg by mouth See admin instructions. Pt takes every evening on Sunday, Tuesday, Wednesday, Thursday, and Saturday.   Yes Historical Provider, MD  pantoprazole (PROTONIX) 40 MG tablet Take 1 tablet (40 mg total) by mouth 2 (two) times daily before a meal. Patient not taking: Reported on 02/25/2017 01/22/17   Eber Jones, MD    Current Medications: . calcitRIOL  0.5 mcg Oral Daily  . febuxostat  40 mg Oral Daily  . lactulose  20 g Oral BID  . pantoprazole  40 mg Oral BID AC     Allergies:    Allergies  Allergen Reactions  . Acyclovir And Related Hives  . Metformin And Related Diarrhea  . Amoxicillin Hives and Other (See  Comments)    Has patient had a PCN reaction causing immediate rash, facial/tongue/throat swelling, SOB or lightheadedness with hypotension: No Has patient had a PCN reaction causing severe rash involving mucus membranes or skin necrosis: No Has patient had a PCN reaction that required hospitalization No Has patient had a PCN reaction occurring within the last 10 years: No If all of the above answers are "NO", then may proceed with Cephalosporin use.  . Augmentin [Amoxicillin-Pot Clavulanate] Hives and Other (See Comments)    Has patient had a PCN reaction causing immediate rash, facial/tongue/throat swelling, SOB or lightheadedness with hypotension: No Has patient had a PCN reaction causing severe rash involving mucus membranes or skin necrosis: No Has patient had a PCN reaction that required hospitalization No Has patient had a PCN reaction occurring within the last 10 years: No If all of the above answers are "NO", then may proceed with Cephalosporin use.  [name] [Trandolapril] Other (See Comments)    Reaction:  Caused renal problems    Social History:   The patient  reports that he quit smoking about 32 years ago. His smoking use included Cigarettes.  He has a 15.00 pack-year smoking history. He has never used smokeless tobacco. He reports that he does not drink alcohol or use drugs.    Family History:   The patient's family history includes Cancer in his paternal uncle; Colon cancer (age of onset: 66) in his father.   ROS:  Please see the history of present illness.  Review of Systems  Constitution: Positive for weakness and malaise/fatigue.  HENT: Negative.   Cardiovascular: Positive for chest pain, dyspnea on exertion, irregular heartbeat and leg swelling.  Endocrine: Negative.   Hematologic/Lymphatic: Negative.   Musculoskeletal: Negative.   Gastrointestinal: Positive for abdominal pain.  Genitourinary: Negative.    All other ROS reviewed and negative.      Vital  Signs: Blood pressure 94/70, pulse 91, temperature 97.5 F (36.4 C), resp. rate 16, height 6\' 1"  (1.854 m), weight 219 lb 12.8 oz (99.7 kg), SpO2 100 %.   PHYSICAL EXAM: General:  Overweight male, in no acute distress  HEENT: normal Lymph: no adenopathy Neck: no JVD Endocrine:  No thryomegaly Vascular: No carotid bruits; FA pulses 2+ bilaterally without bruits  Cardiac: Irregular irregular; normal S1, S2; no murmur, rub, bruit, thrill, or heave Lungs:  Decreased breath sounds without rales Abd: soft, nontender, with hepatomegaly  Ext: 1-2 edema, Good distal pulses bilaterally Musculoskeletal:  No deformities, BUE and BLE strength normal and equal Skin: warm and dry  Neuro:  CNs 2-12 intact, no focal abnormalities noted Psych:  Normal affect    EKG:  afib with RBBB and LAFB with new ST T wave changes laterally. Personally reviewed  Telemetry: afib now rates in 90's personally reviewed  Labs:  Recent Labs  02/25/17 1955 02/25/17 2350 02/26/17 0446 02/27/17 0550  TROPONINI 0.03* 0.03* 0.04* 0.03*   Lab Results  Component Value Date   WBC 10.5 02/27/2017   HGB 13.1 02/27/2017   HCT 37.5 (L) 02/27/2017   MCV 99.5 02/27/2017   PLT 104 (L) 02/27/2017    Recent Labs Lab 02/27/17 0550  NA 127*  K 5.2*  CL 98*  CO2 22  BUN 49*  CREATININE 1.34*  CALCIUM 8.6*  PROT 7.8  BILITOT 3.2*  ALKPHOS 186*  ALT 24  AST 31  GLUCOSE 110*   Lab Results  Component Value Date   CHOL 115 02/18/2017   HDL 40 02/18/2017   LDLCALC 61 02/18/2017   TRIG 72 02/18/2017   No results found for: DDIMER  Radiology/Studies:  Dg Chest Port 1 View  Result Date: 02/25/2017 CLINICAL DATA:  Found unresponsive this morning, mental status changes EXAM: PORTABLE CHEST 1 VIEW COMPARISON:  01/19/2017 FINDINGS: Cardiomegaly again noted. Central mild vascular congestion without convincing pulmonary edema. Elevation of the right hemidiaphragm again noted. Mild left basilar atelectasis.  IMPRESSION: Central mild vascular congestion without convincing pulmonary edema. Elevation of the right hemidiaphragm again noted. Mild left basilar atelectasis. Electronically Signed   By: Lahoma Crocker M.D.   On: 02/25/2017 14:25    Echocardiogram: 01/20/2017 Left ventricle: The cavity size was normal. Wall thickness was   increased in a pattern of mild LVH. Systolic function was normal.   The estimated ejection fraction was in the range of 55% to 60%. - Regional wall motion abnormality: Hypokinesis of the basal-mid   anteroseptal myocardium. - Aortic valve: Valve area (VTI): 2.32 cm^2. Valve area (Vmax):   2.32 cm^2. Valve area (Vmean): 2.16 cm^2. - Mitral valve: There was mild regurgitation. - Left atrium: The atrium was severely dilated. - Right ventricle:  The cavity size was severely dilated. TAPSE: 16   mm . - Right atrium: The atrium was severely dilated. - Atrial septum: No defect or patent foramen ovale was identified. - Tricuspid valve: There was mild-moderate regurgitation. The   available color Doppler images of the TR may underestimate its   severity, especially in the setting of a flattened ventricular   septum in diastole and systolic hepatic flow reversal which would   suggest more severe TR. - Pulmonary arteries: Systolic pressure could not be accurately   estimated. Inadequate TR jet, available Doppler likely   underestimates. - Inferior vena cava: The vessel was dilated. The respirophasic   diameter changes were blunted (< 50%), consistent with elevated   central venous pressure. - Pericardium, extracardiac: There is a large left pleural   effusion. - Technically difficult study. Echocontrast was used to enhance   visualization.   ASSESSMENT AND PLAN:  New onset chest pain with some lateral EKG changes, troponin negative so far, has never had before, could be unstable angina. WMA on echo last month HK basal mid antseptum and also coronary artery calcifications by  recent CT imaging. Because he is relatively hypotensive has not received nitroglycerin. Recommend transfer to [facility] ICU for further evaluation and treatment. Will be on hospitalist service. Will hold Coumadin for potential cardiac catheterization. Was to have right heart cath anyway so can do right and left heart cath. Need to watch renal function closely. Have heart failure team see patient at [facility].  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 Reviewed chart and recent evaluation by Dr. Harl Bowie and Ms. Lawrence NP. Case discussed with Ms. Bonnell Public PA-C. Somewhat convoluted presentation. Patient brought to hospital via EMS after episode of severe hypoglycemia with CBG in the 20s at home associated with apparent loss of consciousness and reportedly requiring brief CPR but no ACLS measures. Baseline history includes chronic atrial fibrillation, recent diagnosis of NASH with ascites and paracentesis of 4 L in February, continued weight loss on outpatient diuretics, hypertension, type 2 diabetes mellitus, previous tobacco use, hyperlipidemia, and CKD stage III. No definite history of obstructive CAD or myocardial infarction, although his recent echocardiogram did show wall motion abnormality and CT imaging demonstrated diffuse coronary atherosclerosis. There is concern that he has some component of right heart failure with RV dilatation by recent echocardiogram but equivocal PA pressures. He had been referred already to the heart failure clinic by Dr. Harl Bowie for consideration of right heart catheterization. Generally speaking he has otherwise been  feeling well up to this point, no exertional chest pain, and satisfied with weight loss on diuretic.  Earlier this morning he developed sudden onset chest discomfort described as indigestion, intense, waxing and waning. This was associated with rapid heart rate in atrial fibrillation and relative hypotension. ECG shows overall nonspecific lateral ST segment changes which are different from prior tracing. His initial troponin I levels have not been suggestive of ACS. On my evaluation he was starting to feel better, less discomfort, systolic blood pressure 417 on IV fluids and heart rate in the 90s in atrial fibrillation. Lungs  exhibit clear breath sounds although decreased at the left base, cardiac exam with irregularly irregular rhythm, no S3, no obvious JVD. Abdomen nontender, chronic appearing leg edema which is improved per patient. Lab work shows sodium 127, potassium 5.2, creatinine 1.3, albumin 3.3, troponin I 0.03, hemoglobin 13.1, INR 2.3, glucose 110.  Asked to see patient secondary to new onset chest pain as outlined above and relative hemodynamic instability. He is medically complex as already discussed, although feeling better in general up to this point as an outpatient. Actually admitted to the hospital with symptomatic severe hypoglycemia which could be a function of continued weight loss without changes in his hypoglycemic regimen. 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 He had already been referred to the heart failure clinic for consideration of right heart catheterization given question of right ventricular dysfunction. Recommendation at this time given concerns about unstable angina and need for further invasive cardiac testing, is to have him transferred to [facility]. He will remain on the hospitalist service for further adjustments in diabetes regimen, could ask heart failure team to see as well and assist with guiding further  cardiac workup. Would hold Coumadin anticipating that he will need a right and left heart catheterization to clarify both potential ischemic symptoms and evaluate for pulmonary hypertension.  Satira Sark, M.D., F.A.C.C.

## 2017-02-27 NOTE — Consult Note (Signed)
Advanced Heart Failure Team Consult Note  Referring Physician: Dr. Domenic Polite  Primary Physician: Claretta Fraise, MD  Primary Cardiologist:  Dr. Harl Bowie  Reason for Consultation: Pulmonary HTN  HPI:    Mr. Lagrange is a 76 year old male with a past medical history of DM, HTN, HLD, CKD II, and permanent atrial fibrillation on Coumadin.   Presented to the [facility] ED on 01/13/17 with rapid Afib and abdominal distention. He was treated with IV diltiazem and CT showed mild cirrhosis of the liver. He was felt stable enough to discharge from the ED with close GI follow up. However, over the next several days he developed worsening abdominal distention, weight gain and SOB.   He represented to the [facility] ED on 01/19/17 and was admitted for ascites and again was in rapid atrial fibrillation. He was started on a Cardizem gtt and also treated for suspected CAP. Echo on 01/20/17 showed normal LVEF, hypokinesis of the basal-mid anteroseptal myocardium, severely dilated RV, mild -mod TR, large pleural effusion. He had a paracentesis with 4L removed on 01/20/17, SAAG score was 1.7 consistent with portal hypertension. GI also felt that the cholestatic pattern on his liver panel were consistent with cardiac ascites. There were some changes on his abdominal CT that were consistent with NASH cirrhosis but it was felt the primary disturbance was cardiac in etiology. He was started on Spironolactone and his atenolol and diltiazem were transitioned off to propanolol as he has a history of essential tremor and now with portal HTN. He was sent home on 100mg  Spironolactone daily and 60mg  Torsemide BID.   On 02/25/17 he was brought to the [facility] ED after being found unresponsive by his wife who called 911. The dispatcher talked the wife through CPR, although it is unclear if pulses were ever lost. Upon EMS arrival, his CBG was 23, he was given D10 and eventually required a bag of D10. He had not eaten very much breakfast  and had taken 40 units of Lantus that morning. Pertinent admission labs include - Na 122, creatinine 1.93, ammonia 75. Also, of significance he has lost 25 pounds in the past month.   He developed sudden onset chest pain, rapid Afib and hypotension on 02/27/17. EKG showed Afib with lateral ST segment changes different from his prior EKG's. Also with recent wall motion abnormality on Echo. He normally goes to the gym daily and walks, has never had any significant chest pain. Troponin 0.04, 0.03, 0.03. He was transferred from [facility] to [facility] for concerns for unstable angina, right ventricular dysfunction and further cardiac work up.   He denies SOB with exertion and at rest. He is very active, works out every day at [employer] and walks daily. He has never had any chest pain or pressure with activity. Does not have a family history of heart disease.   Feeling better this am. Remains in AF with RVR. No further CP. Denies ETOH use. No recent syncope. Has been struggling with edema and ascites. Has lost 30_ pounds.    Review of Systems: [y] = yes, [ ]  = no   General: Weight gain [ ] ; Weight loss [ y]; Anorexia [ ] ; Fatigue Blue.Reese ]; Fever [ ] ; Chills [ ] ; Weakness [ ]   Cardiac: Chest pain/pressure Blue.Reese ]; Resting SOB [ ] ; Exertional SOB Blue.Reese ]; Orthopnea [ ] ; Pedal Edema Blue.Reese ]; Palpitations [ ] ; Syncope [ ] ; Presyncope [ ] ; Paroxysmal nocturnal dyspnea[y ]  Pulmonary: Cough [ ] ;  Wheezing[ ] ; Hemoptysis[ ] ; Sputum [ ] ; Snoring [ y]  GI: Vomiting[ ] ; Dysphagia[ ] ; Melena[ ] ; Hematochezia [ ] ; Heartburn[ ] ; Abdominal pain [ ] ; Constipation [ ] ; Diarrhea [ ] ; BRBPR [ ]   GU: Hematuria[ ] ; Dysuria [ ] ; Nocturia[ ]   Vascular: Pain in legs with walking [ ] ; Pain in feet with lying flat [ ] ; Non-healing sores [ ] ; Stroke [ ] ; TIA [ ] ; Slurred speech [ ] ;  Neuro: Headaches[ ] ; Vertigo[ ] ; Seizures[ ] ; Paresthesias[ ] ;Blurred vision [ ] ; Diplopia [ ] ; Vision changes [ ]   Ortho/Skin: Arthritis Blue.Reese ]; Joint pain Blue.Reese ];  Muscle pain [ ] ; Joint swelling [ ] ; Back Pain [ ] ; Rash [ ]   Psych: Depression[ ] ; Anxiety[ ]   Heme: Bleeding problems [ ] ; Clotting disorders [ ] ; Anemia [ ]   Endocrine: Diabetes [ ] ; Thyroid dysfunction[ ]   Home Medications Prior to Admission medications   Medication Sig Start Date End Date Taking? Authorizing Provider  allopurinol (ZYLOPRIM) 300 MG tablet Take 300 mg by mouth daily.   Yes Historical Provider, MD  calcitRIOL (ROCALTROL) 0.5 MCG capsule Take 0.5 mcg by mouth daily.   Yes Historical Provider, MD  febuxostat (ULORIC) 40 MG tablet Take 1 tablet (40 mg total) by mouth daily. 02/21/17  Yes Claretta Fraise, MD  lactulose (CHRONULAC) 10 GM/15ML solution Take 30 mLs (20 g total) by mouth 2 (two) times daily. 02/21/17  Yes Claretta Fraise, MD  LANTUS SOLOSTAR 100 UNIT/ML Solostar Pen INJECT 40 UNITS INTO THE SKIN EVERY MORNING. 01/27/17  Yes Claretta Fraise, MD  propranolol (INDERAL) 40 MG tablet Take 1 tablet (40 mg total) by mouth 2 (two) times daily. 01/28/17  Yes Lendon Colonel, NP  spironolactone (ALDACTONE) 50 MG tablet Take 1 tablet (50 mg total) by mouth daily with breakfast. 02/21/17  Yes Claretta Fraise, MD  torsemide (DEMADEX) 20 MG tablet 2 each morning and one in afternoon Patient taking differently: Take 20-40 mg by mouth 2 (two) times daily. Pt takes two tablets in the morning and one in the evening. 02/21/17  Yes Claretta Fraise, MD  warfarin (COUMADIN) 10 MG tablet Take 5 mg by mouth See admin instructions. Pt takes every evening on Sunday, Tuesday, Wednesday, Thursday, and Saturday.   Yes Historical Provider, MD  pantoprazole (PROTONIX) 40 MG tablet Take 1 tablet (40 mg total) by mouth 2 (two) times daily before a meal. Patient not taking: Reported on 02/25/2017 01/22/17   Eber Jones, MD    Past Medical History: Past Medical History:  Diagnosis Date  . Abnormality of gait   . Cataract   . Cirrhosis of liver with ascites ([place]) 01/19/2017  . CKD (chronic kidney  disease), stage III    [facility]  . Cor pulmonale (HCC)    Possible  . Essential hypertension   . Essential tremor   . Foot ulcer, right ([place]) 03/2013  . Gout   . Hyperlipidemia   . Permanent atrial fibrillation (HCC)    a. >20 years, prior DCCVs unsuccessful, INR followed by PCP.  Marland Kitchen Type 2 diabetes mellitus ([place])     Past Surgical History: Past Surgical History:  Procedure Laterality Date  . CATARACT EXTRACTION W/PHACO Left 04/19/2013   Procedure: CATARACT EXTRACTION PHACO AND INTRAOCULAR LENS PLACEMENT (IOC);  Surgeon: Tonny Branch, MD;  Location: [facility] ORS;  Service: Ophthalmology;  Laterality: Left;  CDE 29.30  . CATARACT EXTRACTION W/PHACO Right 07/15/2013   Procedure: CATARACT EXTRACTION PHACO AND INTRAOCULAR LENS PLACEMENT (IOC);  Surgeon: Levada Dy  Geoffry Paradise, MD;  Location: [facility] ORS;  Service: Ophthalmology;  Laterality: Right;  CDE:12.84  . ESOPHAGOGASTRODUODENOSCOPY N/A 01/21/2017   Dr. Oneida Alar: mild erosive gastritis and moderate erosive duodenitis (negative H.pylori). Multiple gastric polyps.     Family History: Family History  Problem Relation Age of Onset  . Colon cancer Father 69    Deceased age 73   . Cancer Paternal Uncle   . Liver disease Neg Hx     Social History: Social History   Social History  . Marital status: Married    Spouse name: N/A  . Number of children: N/A  . Years of education: N/A   Occupational History  . retired Dealer    Social History Main Topics  . Smoking status: Former Smoker    Packs/day: 1.00    Years: 15.00    Types: Cigarettes    Quit date: 07/07/1984  . Smokeless tobacco: Never Used     Comment: Smoked 20 years  . Alcohol use No     Comment: HISTORY OF HEAVY ETOH USE in 1966-1968, otherwise none   . Drug use: No  . Sexual activity: Yes    Birth control/ protection: None   Other Topics Concern  . None   Social History Narrative  . None    Allergies:  Allergies  Allergen Reactions  . Acyclovir And Related Hives  .  Metformin And Related Diarrhea  . Amoxicillin Hives and Other (See Comments)    Has patient had a PCN reaction causing immediate rash, facial/tongue/throat swelling, SOB or lightheadedness with hypotension: No Has patient had a PCN reaction causing severe rash involving mucus membranes or skin necrosis: No Has patient had a PCN reaction that required hospitalization No Has patient had a PCN reaction occurring within the last 10 years: No If all of the above answers are "NO", then may proceed with Cephalosporin use.  . Augmentin [Amoxicillin-Pot Clavulanate] Hives and Other (See Comments)    Has patient had a PCN reaction causing immediate rash, facial/tongue/throat swelling, SOB or lightheadedness with hypotension: No Has patient had a PCN reaction causing severe rash involving mucus membranes or skin necrosis: No Has patient had a PCN reaction that required hospitalization No Has patient had a PCN reaction occurring within the last 10 years: No If all of the above answers are "NO", then may proceed with Cephalosporin use.  [name] [Trandolapril] Other (See Comments)    Reaction:  Caused renal problems    Objective:    Vital Signs:   Temp:  [97.3 F (36.3 C)-98.3 F (36.8 C)] 97.3 F (36.3 C) (03/22 1123) Pulse Rate:  [77-107] 107 (03/22 1123) Resp:  [16-22] 22 (03/22 1123) BP: (92-101)/(56-70) 94/70 (03/22 0551) SpO2:  [95 %-100 %] 97 % (03/22 1123) Weight:  [219 lb 12.8 oz (99.7 kg)-220 lb 14.4 oz (100.2 kg)] 219 lb 12.8 oz (99.7 kg) (03/22 0800) Last BM Date: 02/26/17  Weight change: Filed Weights   02/26/17 0500 02/26/17 2209 02/27/17 0800  Weight: 220 lb 10.9 oz (100.1 kg) 220 lb 14.4 oz (100.2 kg) 219 lb 12.8 oz (99.7 kg)    Intake/Output:   Intake/Output Summary (Last 24 hours) at 02/27/17 1212 Last data filed at 02/27/17 1122  Gross per 24 hour  Intake          2747.08 ml  Output             2700 ml  Net            47.08 ml  Physical Exam: General:  Elderly  male, in NAD. Fatigued apearing HEENT: normal, poor dentition temporal wasting Neck: supple. 9-10 cm JVP . Carotids 2+ bilat; no bruits. No lymphadenopathy or thyromegaly appreciated. Cor: PMI nondisplaced. Irregular tachy. 2/6 TR. + RV lift Lungs: Rhonci in upper lobes, diminished in bases. Slight expiratory wheezing no rhinchi  Abdomen: obese soft, nontender, + mildly distended. No bruits or masses. Good bowel sounds. Extremities: no cyanosis, clubbing, rash, trace edema warm Neuro: alert & orientedx3, cranial nerves grossly intact. moves all 4 extremities w/o difficulty. Affect pleasant No asterixis  Telemetry: Afib RVR rate in the 120-140s. Personally reviewed     Labs: Basic Metabolic Panel:  Recent Labs Lab 02/25/17 1401 02/25/17 1955 02/26/17 0446 02/27/17 0550  NA 122* 122* 125* 127*  K 4.7 5.2* 4.5 5.2*  CL 88* 89* 94* 98*  CO2 23 24 22 22   GLUCOSE 62* 176* 141* 110*  BUN 90* 81* 70* 49*  CREATININE 1.93* 1.67* 1.62* 1.34*  CALCIUM 9.1 8.2* 8.3* 8.6*    Liver Function Tests:  Recent Labs Lab 02/25/17 1401 02/26/17 0446 02/27/17 0550  AST 32 29 31  ALT 25 24 24   ALKPHOS 216* 183* 186*  BILITOT 3.1* 3.4* 3.2*  PROT 8.7* 7.6 7.8  ALBUMIN 3.8 3.3* 3.3*    Recent Labs Lab 02/25/17 1411 02/26/17 0446  AMMONIA 75* 48*    CBC:  Recent Labs Lab 02/25/17 1401 02/26/17 0446 02/27/17 0550  WBC 12.5* 10.0 10.5  NEUTROABS  --  7.8*  --   HGB 14.4 12.3* 13.1  HCT 39.7 34.7* 37.5*  MCV 96.6 96.7 99.5  PLT 136* 111* 104*    Cardiac Enzymes:  Recent Labs Lab 02/25/17 1401 02/25/17 1955 02/25/17 2350 02/26/17 0446 02/27/17 0550  TROPONINI 0.04* 0.03* 0.03* 0.04* 0.03*      CBG:  Recent Labs Lab 02/26/17 1954 02/27/17 0029 02/27/17 0431 02/27/17 0854 02/27/17 1124  GLUCAP 149* 120* 108* 105* 78    Coagulation Studies:  Recent Labs  02/25/17 1401 02/26/17 0446 02/27/17 0550  LABPROT 20.3* 21.9* 25.8*  INR 1.71 1.88 2.31     Other results: EKG: Afib, RBBB Personally reviewed   Transthoracic Echocardiography 01/20/17 Study Conclusions  - Left ventricle: The cavity size was normal. Wall thickness was   increased in a pattern of mild LVH. Systolic function was normal.   The estimated ejection fraction was in the range of 55% to 60%. - Regional wall motion abnormality: Hypokinesis of the basal-mid   anteroseptal myocardium. - Aortic valve: Valve area (VTI): 2.32 cm^2. Valve area (Vmax):   2.32 cm^2. Valve area (Vmean): 2.16 cm^2. - Mitral valve: There was mild regurgitation. - Left atrium: The atrium was severely dilated. - Right ventricle: The cavity size was severely dilated. TAPSE: 16   mm . - Right atrium: The atrium was severely dilated. - Atrial septum: No defect or patent foramen ovale was identified. - Tricuspid valve: There was mild-moderate regurgitation. The   available color Doppler images of the TR may underestimate its   severity, especially in the setting of a flattened ventricular   septum in diastole and systolic hepatic flow reversal which would   suggest more severe TR. - Pulmonary arteries: Systolic pressure could not be accurately   estimated. Inadequate TR jet, available Doppler likely   underestimates. - Inferior vena cava: The vessel was dilated. The respirophasic   diameter changes were blunted (< 50%), consistent with elevated   central venous pressure. - Pericardium, extracardiac: There  is a large left pleural   effusion. - Technically difficult study. Echocontrast was used to enhance   visualization.   Medications:     Current Medications: . calcitRIOL  0.5 mcg Oral Daily  . febuxostat  40 mg Oral Daily  . lactulose  20 g Oral BID  . pantoprazole  40 mg Oral BID AC     Infusions: . sodium chloride 75 mL/hr at 02/27/17 0815  . dextrose 50 mL/hr at 02/27/17 1000      Assessment/Plan   1. Acute on chronic diastolic CHF 2. Hypoglycemia 3. Unstable  angina 4. Atrial fibrillation with RVR 5. Hyponatremia/hyperkalemia 6. Cirrhosis of the liver with ascites 7. Type 2 diabetes mellitus  8. CKD stage III  Admitted from [facility] on 02/27/17, has a recent diagnosis of hepatic cirrhosis. Was recently put on high dose spiro at 100mg  daily and torsemide 60mg , had a weight loss of 25 pounds in the past month. He also developed chest pain at [employer], troponin negative, but there is a wall motion abnormality on Echo. RV severely dilated. MD to advise on the need for R/L heart cath  He is currently on Afib RVR, rates elevated in the 130's. BP soft in the 80-90's. Would avoid diltiazem gtt with low BP, I worry about adding Amio gtt as he has hepatic cirrhosis.   Further MD recommendations to follow.   Length of Stay: Ramona, NP  02/27/2017, 12:12 PM Advanced Heart Failure Team  Pager [phone] M-F 7am-4pm.  Please contact [facility] for night-coverage after hours (4p -7a ) and weekends on [email or web address]  Patient seen and examined with Jettie Booze, NP. We discussed all aspects of the encounter. I agree with the assessment and plan as stated above.   Echo images and records from [facility] reviewed personally. 76 yo obese male with h/o PAF but otherwise no known heart disease. Recently diagnosed with cirrhosis. Has had full GI work-up (excluding biopsy). CIrrhosis felt to be due to RV failure +/- NASH. On Echo RV and RA are massively dilated with flattening of interventricular septum.   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 If cirrhosis is primary issue porto-pulmonary PAH is a possibility but he has no real RFs for primary cirrhosis. CTEPH alo unlikely given longstanding anticoagulation for AF.   Currently volume status looks OK. WIll use torsemide and spiro. Continue treatment for hepatic encephalopathy.   Will try to rate control AF with b-blocker. Hold coumadin.  Plan R/L HC next week when INR down. Start heparin when INR < 2.0. Discussed with PharmD. Prognosis poor.  Glori Bickers, MD  9:33 PM

## 2017-02-27 NOTE — Progress Notes (Addendum)
CTSP re:  Chest pain. Chart reviewed, patient seen. 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 He was reportedly given CPR at home transiently, but I am not certain he had a full cardiac arrest.  Did not require intubation.  His troponins had been flat, and EKG showed afib with RBBB.  He also has hx of CKD with Cr of around 2. No SOB or diaphoresis.  His HR is 130-140, unfortunately BP was only 90's, taken by me. He has been on coumadin for afib.  GI cocktail helps a little, but not significantly. His BP exclude the use of NTG, Morphine, or even Cardiazem. I will use digitalis in order to decrease his rate pressure product,  and give fluid bolus.  His ECHO in feb 2018 showed EF 50%. Will cycle his troponins, d/c Aldactone, d/c Propanolol. Will give him one ASA.  Transfer him to the ICU for closer monitoring. If BP goes higher, we will be able to give him morphine, or NTG, or BB. Will consult cardiology later today for further recommendation.   I explained my rationals to him and his wife, and they expressed understanding.   Thanks,  Orvan Falconer MD FACP.  Hospitalist.   CC time: 45 min.

## 2017-02-27 NOTE — Progress Notes (Signed)
Patient c/o mid chest pain rated 6/10 on verbal pain scale. VS per flowsheet. O2 2L [place] applied, stat EKG ordered, MD notified per protocol. Patient denies any other symptoms. Patient belching. Patient states that he thinks pain is d/t indigestion but unsure.

## 2017-02-28 ENCOUNTER — Inpatient Hospital Stay (HOSPITAL_COMMUNITY): Payer: Medicare Other

## 2017-02-28 DIAGNOSIS — E440 Moderate protein-calorie malnutrition: Secondary | ICD-10-CM | POA: Insufficient documentation

## 2017-02-28 DIAGNOSIS — I959 Hypotension, unspecified: Secondary | ICD-10-CM

## 2017-02-28 DIAGNOSIS — I10 Essential (primary) hypertension: Secondary | ICD-10-CM

## 2017-02-28 LAB — CBC
HCT: 37.8 % — ABNORMAL LOW (ref 39.0–52.0)
Hemoglobin: 12.9 g/dL — ABNORMAL LOW (ref 13.0–17.0)
MCH: 34.3 pg — ABNORMAL HIGH (ref 26.0–34.0)
MCHC: 34.1 g/dL (ref 30.0–36.0)
MCV: 100.5 fL — AB (ref 78.0–100.0)
PLATELETS: 96 10*3/uL — AB (ref 150–400)
RBC: 3.76 MIL/uL — AB (ref 4.22–5.81)
RDW: 16.1 % — ABNORMAL HIGH (ref 11.5–15.5)
WBC: 12.1 10*3/uL — AB (ref 4.0–10.5)

## 2017-02-28 LAB — COMPREHENSIVE METABOLIC PANEL WITH GFR
ALT: 26 U/L (ref 17–63)
AST: 28 U/L (ref 15–41)
Albumin: 2.8 g/dL — ABNORMAL LOW (ref 3.5–5.0)
Alkaline Phosphatase: 208 U/L — ABNORMAL HIGH (ref 38–126)
Anion gap: 7 (ref 5–15)
BUN: 28 mg/dL — ABNORMAL HIGH (ref 6–20)
CO2: 21 mmol/L — ABNORMAL LOW (ref 22–32)
Calcium: 8.2 mg/dL — ABNORMAL LOW (ref 8.9–10.3)
Chloride: 104 mmol/L (ref 101–111)
Creatinine, Ser: 1.16 mg/dL (ref 0.61–1.24)
GFR calc Af Amer: >60 mL/min
GFR calc non Af Amer: >60 mL/min
Glucose, Bld: 94 mg/dL (ref 65–99)
Potassium: 5 mmol/L (ref 3.5–5.1)
Sodium: 132 mmol/L — ABNORMAL LOW (ref 135–145)
Total Bilirubin: 5 mg/dL — ABNORMAL HIGH (ref 0.3–1.2)
Total Protein: 7.8 g/dL (ref 6.5–8.1)

## 2017-02-28 LAB — GLUCOSE, CAPILLARY
GLUCOSE-CAPILLARY: 97 mg/dL (ref 65–99)
GLUCOSE-CAPILLARY: 107 mg/dL — AB (ref 65–99)
Glucose-Capillary: 80 mg/dL (ref 65–99)
Glucose-Capillary: 86 mg/dL (ref 65–99)
Glucose-Capillary: 88 mg/dL (ref 65–99)
Glucose-Capillary: 127 mg/dL — ABNORMAL HIGH (ref 65–99)

## 2017-02-28 LAB — AMMONIA: Ammonia: 36 umol/L — ABNORMAL HIGH (ref 9–35)

## 2017-02-28 LAB — PROTIME-INR
INR: 2.76
Prothrombin Time: 29.7 s — ABNORMAL HIGH (ref 11.4–15.2)

## 2017-02-28 LAB — MRSA CULTURE: Culture: NOT DETECTED

## 2017-02-28 LAB — MRSA PCR SCREENING: MRSA BY PCR: NEGATIVE

## 2017-02-28 MED ORDER — SODIUM CHLORIDE 0.9 % IV BOLUS (SEPSIS): 2000.0000 mL | Freq: Once | INTRAVENOUS | Status: DC

## 2017-02-28 MED ORDER — SODIUM CHLORIDE 0.9 % IV BOLUS (SEPSIS)
2000.0000 mL | Freq: Once | INTRAVENOUS | Status: AC
  Administered 2017-02-28: 2000 mL via INTRAVENOUS

## 2017-02-28 MED ORDER — DEXTROSE 5 % IV SOLN
INTRAVENOUS | Status: DC
  Administered 2017-02-28 – 2017-03-01 (×2): via INTRAVENOUS

## 2017-02-28 MED ORDER — METOPROLOL TARTRATE 12.5 MG HALF TABLET
12.5000 mg | ORAL_TABLET | Freq: Two times a day (BID) | ORAL | Status: DC
  Administered 2017-02-28 – 2017-03-01 (×3): 12.5 mg via ORAL
  Filled 2017-02-28 (×3): qty 1

## 2017-02-28 NOTE — Progress Notes (Signed)
Noted with heart rate up to 150's on exertion , afib asymptomatic but goes down to 110 when at rest. Cont. to monitor.

## 2017-02-28 NOTE — Care Management Note (Addendum)
Case Management Note Original Note Created by Lidia Collum [place] 02/26/17 at [place]  Patient Details  Name: SUSAN ARANA MRN: [id number] Date of Birth: Dec 17, 1940  Subjective/Objective:  Adm with Hypoglycemic event. He is from home with wife, ind with ADL's. Has a PCP, drives to appointments, reports no issues affording medications. No HH PTA. Declines HH services.    Action/Plan: Anticipate DC home with self care.   Expected Discharge Date:     02/27/2017             Expected Discharge Plan:  [place]  In-House Referral:     Discharge planning Services  CM Consult  Post Acute Care Choice:  NA Choice offered to:  NA  DME Arranged:    DME Agency:     HH Arranged:    [place] Agency:     Status of Service:  Completed, signed off  If discussed at [employer] of Stay Meetings, dates discussed:    Additional Comments: Pt transferred from [facility] to [facility] for potential cardiac catheterization.CM assessed pt and confirmed all the above information remains accurate to date Oneal Grout 02/28/2017, 11:06 AM

## 2017-02-28 NOTE — Progress Notes (Signed)
Called for report to Gay Filler, [place] at [phone]

## 2017-02-28 NOTE — Progress Notes (Signed)
[place] for Warfarin (hold) >>heparin Indication: atrial fibrillation  Allergies  Allergen Reactions  . Acyclovir And Related Hives  . Metformin And Related Diarrhea  . Amoxicillin Hives and Other (See Comments)    Has patient had a PCN reaction causing immediate rash, facial/tongue/throat swelling, SOB or lightheadedness with hypotension: No Has patient had a PCN reaction causing severe rash involving mucus membranes or skin necrosis: No Has patient had a PCN reaction that required hospitalization No Has patient had a PCN reaction occurring within the last 10 years: No If all of the above answers are "NO", then may proceed with Cephalosporin use.  . Augmentin [Amoxicillin-Pot Clavulanate] Hives and Other (See Comments)    Has patient had a PCN reaction causing immediate rash, facial/tongue/throat swelling, SOB or lightheadedness with hypotension: No Has patient had a PCN reaction causing severe rash involving mucus membranes or skin necrosis: No Has patient had a PCN reaction that required hospitalization No Has patient had a PCN reaction occurring within the last 10 years: No If all of the above answers are "NO", then may proceed with Cephalosporin use.  [name] [Trandolapril] Other (See Comments)    Reaction:  Caused renal problems    Patient Measurements: Height: 6\' 1"  (185.4 cm) Weight: 219 lb 12.8 oz (99.7 kg) IBW/kg (Calculated) : 79.9  Vital Signs: Temp: 97.5 F (36.4 C) (03/23 1141) Temp Source: Oral (03/23 1141) BP: 96/69 (03/23 1141) Pulse Rate: 88 (03/23 1141)  Labs:  Recent Labs  02/26/17 0446 02/27/17 0550 02/27/17 1226 02/27/17 1824 02/28/17 0439 02/28/17 1110  HGB 12.3* 13.1  --   --   --  12.9*  HCT 34.7* 37.5*  --   --   --  37.8*  PLT 111* 104*  --   --   --  PENDING  LABPROT 21.9* 25.8*  --   --  29.7*  --   INR 1.88 2.31  --   --  2.76  --   CREATININE 1.62* 1.34*  --   --   --  1.16  TROPONINI 0.04*  0.03* 0.03* 0.03*  --   --     Estimated Creatinine Clearance: 70.4 mL/min (by C-G formula based on SCr of 1.16 mg/dL).   Medical History: Past Medical History:  Diagnosis Date  . Abnormality of gait   . Cataract   . Cirrhosis of liver with ascites ([place]) 01/19/2017  . CKD (chronic kidney disease), stage III    [facility]  . Cor pulmonale (HCC)    Possible  . Essential hypertension   . Essential tremor   . Foot ulcer, right ([place]) 03/2013  . Gout   . Hyperlipidemia   . Permanent atrial fibrillation (HCC)    a. >20 years, prior DCCVs unsuccessful, INR followed by PCP.  Marland Kitchen Type 2 diabetes mellitus [facility])    Assessment: 76 y.o. male with medical history significant of gait abnormality, cataract, stage III chronic kidney disease, type 2 diabetes, essential tremor, gout, hyperlipidemia, hypertension, permanent atrial fibrillation on warfarin who was brought to the emergency department via EMS after he was found unresponsive by his wife.   MD planning L/RHC when INR down. Will hold warfarin and plan to start heparin once INR is less than 2.   INR 2.7 this am. No bleeding issues noted. Last dose of warfarin was 3/21.  Goal of Therapy:  INR 2-3   Plan:  Hold warfarin tonight Start heparin when INR<2 Monitor for signs and symptoms of  bleeding.   Erin Hearing PharmD., BCPS Clinical Pharmacist Pager [phone] 02/28/2017 12:28 PM

## 2017-02-28 NOTE — Progress Notes (Signed)
eLink Physician-Brief Progress Note Patient Name: Melvin Wang DOB: [birth year] MRN: [id number]   Date of Service  02/28/2017  HPI/Events of Note  Notified patient hypotensive. Currently in atrial fibrillation. Receiving scheduled IV Lopressor as well as digoxin. Received a dose of Aldactone previously. Overall negative fluid status yesterday. Admitted with hypoglycemia. Fluid monitoring shows -1.5 L for this admission. Reviewing echocardiogram shows EF within reasonably normal range and mild hypertrophy. Unable to assess diastolic function due to atrial fibrillation.   eICU Interventions  1. Holding further IV Lopressor 2. Normal saline 2 L IV fluid bolus over 2 hours 3. Continuous telemetry monitoring 4. Holding on further diuresis 5. Continuing digoxin 6. Further plan of care as per primary service      Intervention Category Major Interventions: Hypotension - evaluation and management  Tera Partridge 02/28/2017, 1:38 AM

## 2017-02-28 NOTE — Progress Notes (Signed)
Initial Nutrition Assessment  DOCUMENTATION CODES:   Non-severe (moderate) malnutrition in context of chronic illness  INTERVENTION:   -Snacks TID  NUTRITION DIAGNOSIS:   Malnutrition related to chronic illness as evidenced by percent weight loss, mild depletion of muscle mass, moderate depletions of muscle mass, mild depletion of body fat, moderate depletion of body fat.  GOAL:   Patient will meet greater than or equal to 90% of their needs  MONITOR:   PO intake, Labs, Weight trends, Skin, I & O's  REASON FOR ASSESSMENT:   Malnutrition Screening Tool    ASSESSMENT:   Melvin Wang is a 76 y.o. male with medical history significant of gait abnormality, cataract, stage III chronic kidney disease, type 2 diabetes, essential tremor, gout, hyperlipidemia, hypertension, permanent atrial fibrillation on warfarin who was brought to the emergency department via EMS after he was found unresponsive by his wife.  Pt admitted with a-fib and hypoglycemic event.   Hx obtained from pt, wife, and daughter. Pt reports that he was losing weight intentionally prior to one month ago, by watching what he eats and working out at the gym at [employer]. However, pt shares that he was recently diagnosed with cirrhosis and has experienced a general decline in health since then, including decreased appetite related to early satiety and weight loss. He shares that he eats very little "because there's nowhere for the food to go". Per pt wife, pt generally consumes 4 smaller meals per day to help manage DM and she starting putting protein powder in pt's soups to assist with protein intake.   Pt wt hx reveals a 9.1% wt loss over the past month, which is significant for time frame. Suspect wt loss is multifactorial (diuretics and poor appetite).  Nutrition-Focused physical exam completed. Findings are mild to moderate fat depletion, mild to moderate muscle depletion, and mild edema.    Discussed importance of good meal (particularly protein) intake. Discussed specific ways pt could include more calories and protein in diet, including consuming 4-6 small meals per day (encouraged foods such as yogurt, meat, fish, beans, and peanut butter). Pt refuses supplements; shares that he tried Ensure and "it made him throw up", per pt daughter report.   Pt very knowledgeable about hypoglycemia management- carries glucose tablets with him and was able to teach back to this RD regarding symptoms and correction. Family suspects hypoglycemic events have been more prevalent as insulin doses have not been changed since pt lost weigh (of note, pt has lost 38# within the past 6 months, all of which was intentional up until 1 month ago).   Labs reviewed: Na: 132.   Diet Order:  Diet Heart Room service appropriate? Yes; Fluid consistency: Thin  Skin:  Reviewed, no issues  Last BM:  02/27/17  Height:   Ht Readings from Last 1 Encounters:  02/27/17 6\' 1"  (1.854 m)    Weight:   Wt Readings from Last 1 Encounters:  02/27/17 219 lb 12.8 oz (99.7 kg)    Ideal Body Weight:  83.6 kg  BMI:  Body mass index is 29 kg/m.  Estimated Nutritional Needs:   Kcal:  2200-2400  Protein:  120-135 grams  Fluid:  2.2-2.4 L  EDUCATION NEEDS:   Education needs addressed  Melvin Wang A. Jimmye Norman, RD, LDN, CDE Pager: [phone] After hours Pager: [phone]

## 2017-02-28 NOTE — Progress Notes (Signed)
PROGRESS NOTE    Melvin Wang  CLE:[id number] DOB: [birth year] DOA: 02/25/2017 PCP: Claretta Fraise, MD   Chief Complaint  Patient presents with  . Hypoglycemia    Brief Narrative:  76 year old man admitted to the hospital on 3/20 from home after found unresponsive, upon EMS arrival his CBG was 23. Sugars have responded to dextrose administration. Overnight events of 3/21-3/22 noted. Patient developed substernal chest pain with lateral changes on EKG. Has been seen by cardiology and is currently awaiting transfer to [facility] for potential cardiac catheterization. Assessment & Plan   Acute metabolic encephalopathy -Appears to have resolved -Due to hypoglycemia.  Hypoglycemia -Upon EMS arrival, blood glucose 23 -Patient has lost 25lbs  recently  -placed on D5  -hemoglobin A1C 5.4 on 01/19/2017  Substernal chest pain/unstable angina -With nonspecific ST changes laterally on EKG. -Cardiology consulted and appreciated, transferred to [facility] for potential cardiac catheterization -Troponins trended, and flat (0.03-0.04) -Pending further recommendations from cardiology  Right heart failure -Cardiology consulted and appreciated -Plan for RHC/LHC on Monday 03/03/2017  Hypotension -Patient was on IV lopressors, which has been discontinued -Currently on IVF- only D5 -Cardiology started PO metoprolol for rate control of Afib  Hyponatremia -Due to cirrhosis, worse than baseline, suspect in part due to some mild dehydration. -Improved with IV fluids -Currently 127, continue to monitor BMP  Permanent atrial fibrillation -Rate controlled, warfarin remains on hold due to potential of cardiac catheterization. -Last INR was 2.76  Cirrhosis of the liver -Continue lactulose -Followed by Dr. Barney Drain   Chronic kidney disease, stage III -Creatinine 1.34 on 3/22. Currently pending labs this morning -Continue to monitor BMP  DVT Prophylaxis  INR therapeutic. Will  start heparin when INR <2  Code Status: Full  Family Communication: Wife at bedside  Disposition Plan: Admitted. Pending heart cath on Monday, 3/26  Consultants Cardiology  Procedures  None  Antibiotics   Anti-infectives    None      Subjective:   Melvin Wang seen and examined today.  Patient still complains of dull substernal chest pain.  Denies shortness of breath, abdominal pain, N/D/C/D.    Objective:   Vitals:   02/28/17 0400 02/28/17 0500 02/28/17 0645 02/28/17 0738  BP:  92/65 113/71 (!) 86/71  Pulse: (!) 109 89 76 80  Resp: (!) 30 (!) 28 (!) 26 (!) 29  Temp: 97.7 F (36.5 C)  97.2 F (36.2 C) 97.8 F (36.6 C)  TempSrc: Axillary  Oral Oral  SpO2: 96% 95% 98% 98%  Weight:      Height:        Intake/Output Summary (Last 24 hours) at 02/28/17 0951 Last data filed at 02/28/17 0800  Gross per 24 hour  Intake          1281.25 ml  Output             1350 ml  Net           -68.75 ml   Filed Weights   02/26/17 0500 02/26/17 2209 02/27/17 0800  Weight: 100.1 kg (220 lb 10.9 oz) 100.2 kg (220 lb 14.4 oz) 99.7 kg (219 lb 12.8 oz)    Exam  General: Well developed, well nourished, NAD, appears stated age  HEENT: NCAT, mucous membranes moist. Poor dentition   Neck: Supple, + JVD, no masses  Cardiovascular: S1 S2 auscultated, irregularly irregular  Respiratory: Diminished breath sound, mild expiratory wheezing  Abdomen: Soft, nontender, nondistended, + bowel sounds  Extremities: warm dry without cyanosis clubbing  or edema  Neuro: AAOx3, nonfocal  Skin: Without rashes exudates or nodules  Psych: Normal affect and demeanor with intact judgement and insight   Data Reviewed: I have personally reviewed following labs and imaging studies  CBC:  Recent Labs Lab 02/25/17 1401 02/26/17 0446 02/27/17 0550  WBC 12.5* 10.0 10.5  NEUTROABS  --  7.8*  --   HGB 14.4 12.3* 13.1  HCT 39.7 34.7* 37.5*  MCV 96.6 96.7 99.5  PLT 136* 111* 104*   Basic  Metabolic Panel:  Recent Labs Lab 02/25/17 1401 02/25/17 1955 02/26/17 0446 02/27/17 0550  NA 122* 122* 125* 127*  K 4.7 5.2* 4.5 5.2*  CL 88* 89* 94* 98*  CO2 23 24 22 22   GLUCOSE 62* 176* 141* 110*  BUN 90* 81* 70* 49*  CREATININE 1.93* 1.67* 1.62* 1.34*  CALCIUM 9.1 8.2* 8.3* 8.6*   GFR: Estimated Creatinine Clearance: 61 mL/min (A) (by C-G formula based on SCr of 1.34 mg/dL (H)). Liver Function Tests:  Recent Labs Lab 02/25/17 1401 02/26/17 0446 02/27/17 0550  AST 32 29 31  ALT 25 24 24   ALKPHOS 216* 183* 186*  BILITOT 3.1* 3.4* 3.2*  PROT 8.7* 7.6 7.8  ALBUMIN 3.8 3.3* 3.3*   No results for input(s): LIPASE, AMYLASE in the last 168 hours.  Recent Labs Lab 02/25/17 1411 02/26/17 0446  AMMONIA 75* 48*   Coagulation Profile:  Recent Labs Lab 02/21/17 0953 02/25/17 1401 02/26/17 0446 02/27/17 0550 02/28/17 0439  INR 2.0* 1.71 1.88 2.31 2.76   Cardiac Enzymes:  Recent Labs Lab 02/25/17 2350 02/26/17 0446 02/27/17 0550 02/27/17 1226 02/27/17 1824  TROPONINI 0.03* 0.04* 0.03* 0.03* 0.03*   BNP (last 3 results) No results for input(s): PROBNP in the last 8760 hours. HbA1C: No results for input(s): HGBA1C in the last 72 hours. CBG:  Recent Labs Lab 02/27/17 2131 02/28/17 0002 02/28/17 0326 02/28/17 0551 02/28/17 0735  GLUCAP 128* 95 80 97 86   Lipid Profile: No results for input(s): CHOL, HDL, LDLCALC, TRIG, CHOLHDL, LDLDIRECT in the last 72 hours. Thyroid Function Tests: No results for input(s): TSH, T4TOTAL, FREET4, T3FREE, THYROIDAB in the last 72 hours. Anemia Panel: No results for input(s): VITAMINB12, FOLATE, FERRITIN, TIBC, IRON, RETICCTPCT in the last 72 hours. Urine analysis:    Component Value Date/Time   COLORURINE YELLOW 02/25/2017 [place] 02/25/2017 1355   APPEARANCEUR Clear 09/28/2013 0845   LABSPEC 1.010 02/25/2017 1355   PHURINE 5.0 02/25/2017 1355   GLUCOSEU 50 (A) 02/25/2017 1355   HGBUR  NEGATIVE 02/25/2017 1355   BILIRUBINUR NEGATIVE 02/25/2017 1355   BILIRUBINUR neg 12/07/2015 0939   BILIRUBINUR Negative 09/28/2013 0845   KETONESUR NEGATIVE 02/25/2017 1355   PROTEINUR NEGATIVE 02/25/2017 1355   UROBILINOGEN negative 12/07/2015 0939   UROBILINOGEN 0.2 02/19/2014 1612   NITRITE NEGATIVE 02/25/2017 1355   LEUKOCYTESUR NEGATIVE 02/25/2017 1355   LEUKOCYTESUR Negative 09/28/2013 0845   Sepsis Labs: @LABRCNTIP (procalcitonin:4,lacticidven:4)  ) Recent Results (from the past 240 hour(s))  MRSA PCR Screening     Status: Abnormal   Collection Time: 02/26/17  5:20 AM  Result Value Ref Range Status   MRSA by PCR INVALID RESULTS, SPECIMEN SENT FOR CULTURE (A) NEGATIVE Final    Comment: RESULT CALLED TO, READ BACK BY AND VERIFIED WITH: PHILLIPS C. AT [id number] ON [id number] BY THOMPSON S.   MRSA culture     Status: None (Preliminary result)   Collection Time: 02/26/17  5:20 AM  Result Value Ref Range Status  Specimen Description NOSE  Final   Special Requests NONE  Final   Culture   Final    CULTURE REINCUBATED FOR BETTER GROWTH Performed at [facility], [place] [address], [place],  [address]    Report Status PENDING  Incomplete      Radiology Studies: Dg Chest Port 1 View  Result Date: 02/28/2017 CLINICAL DATA:  Cough for 1 day EXAM: PORTABLE CHEST 1 VIEW COMPARISON:  February 25, 2017 FINDINGS: There is mild atelectasis in the left base, stable. There is no edema or consolidation. There is cardiomegaly with pulmonary vascularity within normal limits. No adenopathy. There is atherosclerotic calcification aorta. No bone lesions. IMPRESSION: Stable cardiomegaly. Aortic atherosclerosis. Atelectasis left base. No frank edema or consolidation. Electronically Signed   By: Lowella Grip III M.D.   On: 02/28/2017 08:58     Scheduled Meds: . calcitRIOL  0.5 mcg Oral Daily  . febuxostat  40 mg Oral Daily  . lactulose  20 g Oral BID  . metoprolol tartrate  12.5 mg Oral  BID  . pantoprazole  40 mg Oral BID AC   Continuous Infusions: . dextrose       LOS: 3 days   Time Spent in minutes   30 minutes  Hannibal Skalla D.O. on 02/28/2017 at 9:51 AM  Between 7am to 7pm - Pager - [phone]  After 7pm go to [email or web address] - password [email or web address]  And look for the night coverage person covering for me after hours  [facility] Office  [phone]

## 2017-02-28 NOTE — Progress Notes (Signed)
Report received from Child psychotherapist at [facility].

## 2017-03-01 ENCOUNTER — Inpatient Hospital Stay (HOSPITAL_COMMUNITY): Payer: Medicare Other

## 2017-03-01 DIAGNOSIS — E7220 Disorder of urea cycle metabolism, unspecified: Secondary | ICD-10-CM

## 2017-03-01 DIAGNOSIS — I50810 Right heart failure, unspecified: Secondary | ICD-10-CM

## 2017-03-01 DIAGNOSIS — D696 Thrombocytopenia, unspecified: Secondary | ICD-10-CM

## 2017-03-01 DIAGNOSIS — I482 Chronic atrial fibrillation, unspecified: Secondary | ICD-10-CM

## 2017-03-01 DIAGNOSIS — K7031 Alcoholic cirrhosis of liver with ascites: Secondary | ICD-10-CM

## 2017-03-01 DIAGNOSIS — R05 Cough: Secondary | ICD-10-CM

## 2017-03-01 DIAGNOSIS — M1A09X0 Idiopathic chronic gout, multiple sites, without tophus (tophi): Secondary | ICD-10-CM

## 2017-03-01 LAB — BASIC METABOLIC PANEL
ANION GAP: 8 (ref 5–15)
BUN: 29 mg/dL — ABNORMAL HIGH (ref 6–20)
CHLORIDE: 100 mmol/L — AB (ref 101–111)
CO2: 21 mmol/L — AB (ref 22–32)
Calcium: 8.4 mg/dL — ABNORMAL LOW (ref 8.9–10.3)
Creatinine, Ser: 1.2 mg/dL (ref 0.61–1.24)
GFR calc non Af Amer: 58 mL/min — ABNORMAL LOW (ref >60)
Glucose, Bld: 112 mg/dL — ABNORMAL HIGH (ref 65–99)
POTASSIUM: 4.8 mmol/L (ref 3.5–5.1)
Sodium: 129 mmol/L — ABNORMAL LOW (ref 135–145)

## 2017-03-01 LAB — GLUCOSE, CAPILLARY
GLUCOSE-CAPILLARY: 131 mg/dL — AB (ref 65–99)
GLUCOSE-CAPILLARY: 136 mg/dL — AB (ref 65–99)
GLUCOSE-CAPILLARY: 115 mg/dL — AB (ref 65–99)
Glucose-Capillary: 107 mg/dL — ABNORMAL HIGH (ref 65–99)
Glucose-Capillary: 131 mg/dL — ABNORMAL HIGH (ref 65–99)

## 2017-03-01 LAB — CBC
HEMATOCRIT: 36.8 % — AB (ref 39.0–52.0)
HEMOGLOBIN: 12.4 g/dL — AB (ref 13.0–17.0)
MCH: 33.9 pg (ref 26.0–34.0)
MCHC: 33.7 g/dL (ref 30.0–36.0)
MCV: 100.5 fL — AB (ref 78.0–100.0)
Platelets: 95 10*3/uL — ABNORMAL LOW (ref 150–400)
RBC: 3.66 MIL/uL — AB (ref 4.22–5.81)
RDW: 16.1 % — ABNORMAL HIGH (ref 11.5–15.5)
WBC: 11.5 10*3/uL — AB (ref 4.0–10.5)

## 2017-03-01 LAB — PROTIME-INR
INR: 2.48
Prothrombin Time: 27.3 seconds — ABNORMAL HIGH (ref 11.4–15.2)

## 2017-03-01 LAB — AMMONIA: Ammonia: 25 umol/L (ref 9–35)

## 2017-03-01 MED ORDER — LEVALBUTEROL HCL 1.25 MG/0.5ML IN NEBU
1.2500 mg | INHALATION_SOLUTION | Freq: Four times a day (QID) | RESPIRATORY_TRACT | Status: DC | PRN
  Administered 2017-03-01: 1.25 mg via RESPIRATORY_TRACT
  Filled 2017-03-01: qty 0.5

## 2017-03-01 MED ORDER — METOPROLOL TARTRATE 25 MG PO TABS
25.0000 mg | ORAL_TABLET | Freq: Two times a day (BID) | ORAL | Status: DC
  Administered 2017-03-01 – 2017-03-07 (×12): 25 mg via ORAL
  Filled 2017-03-01 (×12): qty 1

## 2017-03-01 MED ORDER — FUROSEMIDE 10 MG/ML IJ SOLN
40.0000 mg | Freq: Once | INTRAMUSCULAR | Status: AC
  Administered 2017-03-01: 40 mg via INTRAVENOUS
  Filled 2017-03-01: qty 4

## 2017-03-01 MED ORDER — AMIODARONE LOAD VIA INFUSION
150.0000 mg | Freq: Once | INTRAVENOUS | Status: AC
  Administered 2017-03-01: 150 mg via INTRAVENOUS
  Filled 2017-03-01: qty 83.34

## 2017-03-01 MED ORDER — AMIODARONE HCL IN DEXTROSE 360-4.14 MG/200ML-% IV SOLN
30.0000 mg/h | INTRAVENOUS | Status: DC
  Administered 2017-03-02 – 2017-03-05 (×8): 30 mg/h via INTRAVENOUS
  Filled 2017-03-01 (×9): qty 200

## 2017-03-01 MED ORDER — BENZONATATE 100 MG PO CAPS: 100.0000 mg | ORAL_CAPSULE | Freq: Three times a day (TID) | ORAL | Status: DC | PRN

## 2017-03-01 MED ORDER — IPRATROPIUM BROMIDE 0.02 % IN SOLN
0.5000 mg | Freq: Four times a day (QID) | RESPIRATORY_TRACT | Status: DC | PRN
  Administered 2017-03-01: 0.5 mg via RESPIRATORY_TRACT
  Filled 2017-03-01: qty 2.5

## 2017-03-01 MED ORDER — SPIRONOLACTONE 25 MG PO TABS
25.0000 mg | ORAL_TABLET | Freq: Every day | ORAL | Status: DC
  Administered 2017-03-01 – 2017-03-02 (×2): 25 mg via ORAL
  Filled 2017-03-01 (×2): qty 1

## 2017-03-01 MED ORDER — AMIODARONE HCL IN DEXTROSE 360-4.14 MG/200ML-% IV SOLN
60.0000 mg/h | INTRAVENOUS | Status: AC
  Administered 2017-03-01 (×2): 60 mg/h via INTRAVENOUS
  Filled 2017-03-01 (×2): qty 200

## 2017-03-01 MED ORDER — TORSEMIDE 20 MG PO TABS
40.0000 mg | ORAL_TABLET | Freq: Two times a day (BID) | ORAL | Status: DC
  Administered 2017-03-01 – 2017-03-07 (×12): 40 mg via ORAL
  Filled 2017-03-01 (×12): qty 2

## 2017-03-01 MED ORDER — PROMETHAZINE HCL 25 MG/ML IJ SOLN: 12.5000 mg | Freq: Once | INTRAMUSCULAR | Status: DC | PRN

## 2017-03-01 MED ORDER — LACTULOSE 10 GM/15ML PO SOLN
20.0000 g | Freq: Two times a day (BID) | ORAL | Status: DC
  Administered 2017-03-01: 20 g via ORAL
  Filled 2017-03-01: qty 30

## 2017-03-01 MED ORDER — MORPHINE SULFATE (PF) 2 MG/ML IV SOLN
2.0000 mg | Freq: Once | INTRAVENOUS | Status: AC | PRN
  Administered 2017-03-01: 2 mg via INTRAVENOUS
  Filled 2017-03-01: qty 1

## 2017-03-01 NOTE — Progress Notes (Signed)
Advanced Heart Failure Rounding Note   Subjective:    Feels very weak today. Got up to go to the bathroom and was completely wiped out. No CP. + DOE. No orthopnea or PND.  Having numerous BMs on lactulose.   Remains in AF 130-160   Objective:   Weight Range:  Vital Signs:   Temp:  [97.8 F (36.6 C)-98.7 F (37.1 C)] 97.9 F (36.6 C) (03/24 1319) Pulse Rate:  [72-161] 160 (03/24 1319) Resp:  [17-32] 30 (03/24 1319) BP: (90-116)/(60-84) 101/69 (03/24 1319) SpO2:  [93 %-97 %] 93 % (03/24 1319) FiO2 (%):  [96 %] 96 % (03/23 1939) Last BM Date: 02/28/17  Weight change: Filed Weights   02/26/17 0500 02/26/17 2209 02/27/17 0800  Weight: 100.1 kg (220 lb 10.9 oz) 100.2 kg (220 lb 14.4 oz) 99.7 kg (219 lb 12.8 oz)    Intake/Output:   Intake/Output Summary (Last 24 hours) at 03/01/17 1401 Last data filed at 03/01/17 0010  Gross per 24 hour  Intake                0 ml  Output              525 ml  Net             -525 ml     Physical Exam: General:  Fatigued appearing. Lying flat in bed.  HEENT: normal Neck: supple. JVP to jaw with prominent CV waves. Carotids 2+ bilat; no bruits. No lymphadenopathy or thryomegaly appreciated. Cor: PMI nondisplaced. IRR IRR tachy 2/6 TR +RV lift  Lungs: clear anteriorly  Abdomen: soft, nontender, nondistended. No hepatosplenomegaly. No bruits or masses. Good bowel sounds. Extremities: no cyanosis, clubbing, rash, trace edema Neuro: alert & orientedx3, cranial nerves grossly intact. moves all 4 extremities w/o difficulty. Affect pleasant  Telemetry: AF 130-160 Personally reviewed   Labs: Basic Metabolic Panel:  Recent Labs Lab 02/25/17 1955 02/26/17 0446 02/27/17 0550 02/28/17 1110 03/01/17 0357  NA 122* 125* 127* 132* 129*  K 5.2* 4.5 5.2* 5.0 4.8  CL 89* 94* 98* 104 100*  CO2 24 22 22  21* 21*  GLUCOSE 176* 141* 110* 94 112*  BUN 81* 70* 49* 28* 29*  CREATININE 1.67* 1.62* 1.34* 1.16 1.20  CALCIUM 8.2* 8.3* 8.6* 8.2*  8.4*    Liver Function Tests:  Recent Labs Lab 02/25/17 1401 02/26/17 0446 02/27/17 0550 02/28/17 1110  AST 32 29 31 28   ALT 25 24 24 26   ALKPHOS 216* 183* 186* 208*  BILITOT 3.1* 3.4* 3.2* 5.0*  PROT 8.7* 7.6 7.8 7.8  ALBUMIN 3.8 3.3* 3.3* 2.8*   No results for input(s): LIPASE, AMYLASE in the last 168 hours.  Recent Labs Lab 02/26/17 0446 02/28/17 1110 03/01/17 1021  AMMONIA 48* 36* 25    CBC:  Recent Labs Lab 02/25/17 1401 02/26/17 0446 02/27/17 0550 02/28/17 1110 03/01/17 0357  WBC 12.5* 10.0 10.5 12.1* 11.5*  NEUTROABS  --  7.8*  --   --   --   HGB 14.4 12.3* 13.1 12.9* 12.4*  HCT 39.7 34.7* 37.5* 37.8* 36.8*  MCV 96.6 96.7 99.5 100.5* 100.5*  PLT 136* 111* 104* 96* 95*    Cardiac Enzymes:  Recent Labs Lab 02/25/17 2350 02/26/17 0446 02/27/17 0550 02/27/17 1226 02/27/17 1824  TROPONINI 0.03* 0.04* 0.03* 0.03* 0.03*    BNP: BNP (last 3 results) No results for input(s): BNP in the last 8760 hours.  ProBNP (last 3 results) No results for input(s): PROBNP  in the last 8760 hours.    Other results:  Imaging: Dg Chest Port 1 View  Result Date: 02/28/2017 CLINICAL DATA:  Cough for 1 day EXAM: PORTABLE CHEST 1 VIEW COMPARISON:  February 25, 2017 FINDINGS: There is mild atelectasis in the left base, stable. There is no edema or consolidation. There is cardiomegaly with pulmonary vascularity within normal limits. No adenopathy. There is atherosclerotic calcification aorta. No bone lesions. IMPRESSION: Stable cardiomegaly. Aortic atherosclerosis. Atelectasis left base. No frank edema or consolidation. Electronically Signed   By: Lowella Grip III M.D.   On: 02/28/2017 08:58      Medications:     Scheduled Medications: . calcitRIOL  0.5 mcg Oral Daily  . febuxostat  40 mg Oral Daily  . metoprolol tartrate  12.5 mg Oral BID  . pantoprazole  40 mg Oral BID AC    Infusions: . dextrose 50 mL/hr at 03/01/17 1200     PRN  Medications:  ondansetron **OR** ondansetron (ZOFRAN) IV   Assessment:   1. Acute on chronic diastolic CHF 2. Hypoglycemia 3. Unstable angina 4. Chronic Atrial fibrillation now with RVR 5. Hyponatremia/hyperkalemia 6. Cirrhosis of the liver with ascites 7. Type 2 diabetes mellitus  8. 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 If cirrhosis is primary issue porto-pulmonary PAH is a possibility but he has no real RFs for primary cirrhosis. CTEPH alo unlikely given longstanding anticoagulation for AF.   Remains extremely tenuous with RV failure and liver failure. AF with RVR persists. Will need to start IV amio for rate control despite liver disease. Will do our best to limit dose.   Volume status going back up. Will give one dose IV lasix and then restart torsemide and spiro. Resume lactulose for hepatic encephalopathy.   INR 2.48 Hold coumadin. Plan R/L HC next week when INR down. Start heparin when INR < 2.0. Discussed with pharmD.  Long talk with him and his family today about prognosis and my concern for him. He stated he would like to be Full Code "at least one time" but would not want to be a vegetable and kept alive with machines.      Length of Stay: 4   Phill Steck 03/01/2017, 2:01 PM  Advanced Heart Failure Team Pager [phone] (M-F; 7a - 4p)  Please contact [facility] for night-coverage after hours (4p -7a ) and weekends on [email or web address]

## 2017-03-01 NOTE — Progress Notes (Signed)
PROGRESS NOTE    Melvin Wang  YOV:[id number] DOB: 1941/05/07 DOA: 02/25/2017 PCP: Claretta Fraise, MD   Chief Complaint  Patient presents with  . Hypoglycemia    Brief Narrative:  76 year old man admitted to the hospital on 3/20 from home after found unresponsive, upon EMS arrival his CBG was 23. Sugars have responded to dextrose administration. Overnight events of 3/21-3/22 noted. Patient developed substernal chest pain with lateral changes on EKG. Has been seen by cardiology and is currently awaiting transfer to [facility] for potential cardiac catheterization. Assessment & Plan   Acute metabolic encephalopathy -Appears to have resolved -Due to hypoglycemia.  Hypoglycemia -Upon EMS arrival, blood glucose 23 -Patient has lost 25lbs  recently  -placed on D5  -hemoglobin A1C 5.4 on 01/19/2017  Substernal chest pain/unstable angina -With nonspecific ST changes laterally on EKG. -Cardiology consulted and appreciated, transferred to [facility] for potential cardiac catheterization -Troponins trended, and flat (0.03-0.04) -Plan for right and left heart cath on 3/26  Right heart failure -Cardiology consulted and appreciated -Plan for RHC/LHC on Monday 03/03/2017  Hypotension -Patient was on IV lopressors, which has been discontinued -Currently on IVF- only D5 -Cardiology started PO metoprolol for rate control of Afib  Hyponatremia -Due to cirrhosis, worse than baseline, suspect in part due to some mild dehydration. -Improved with IV fluids -Currently 129, continue to monitor BMP  Permanent atrial fibrillation -Rate controlled, warfarin remains on hold due to potential of cardiac catheterization. -Last INR was 2.48  Cirrhosis of the liver -hold lactulose, ammonia level 25 -Followed by Dr. Barney Drain   Chronic kidney disease, stage III -Creatinine 1.2 today -Continue to monitor BMP  DVT Prophylaxis  INR therapeutic. Will start heparin when INR  <2  Code Status: Full  Family Communication: Wife at bedside  Disposition Plan: Admitted. Pending heart cath on Monday, 3/26  Consultants Cardiology  Procedures  None  Antibiotics   Anti-infectives    None      Subjective:   Nolon Rod seen and examined today.  Patient feels better this morning. Able to sleep better last night. Denies shortness of breath. Still has some dull chest pain. still complains of dull substernal chest pain.  Denies abdominal pain, N/D/C/D.    Objective:   Vitals:   03/01/17 0400 03/01/17 0600 03/01/17 0741 03/01/17 1319  BP: 95/66 94/66 107/62 101/69  Pulse: 80 88 72 (!) 160  Resp: (!) 25 19 18  (!) 30  Temp:   97.8 F (36.6 C) 97.9 F (36.6 C)  TempSrc:   Oral Oral  SpO2: 97% 96% 94% 93%  Weight:      Height:        Intake/Output Summary (Last 24 hours) at 03/01/17 1336 Last data filed at 03/01/17 0010  Gross per 24 hour  Intake                0 ml  Output              525 ml  Net             -525 ml   Filed Weights   02/26/17 0500 02/26/17 2209 02/27/17 0800  Weight: 100.1 kg (220 lb 10.9 oz) 100.2 kg (220 lb 14.4 oz) 99.7 kg (219 lb 12.8 oz)    Exam  General: Well developed, well nourished, NAD, appears stated age  HEENT: NCAT, mucous membranes moist. Poor dentition   Neck: Supple, + JVD, no masses  Cardiovascular: S1 S2 auscultated, irregularly irregular  Respiratory:  Diminished breath sound, mild expiratory wheezing  Abdomen: Soft, nontender, nondistended, + bowel sounds  Extremities: warm dry without cyanosis clubbing or edema  Neuro: AAOx3, nonfocal  Psych: Appropriate mood and affect   Data Reviewed: I have personally reviewed following labs and imaging studies  CBC:  Recent Labs Lab 02/25/17 1401 02/26/17 0446 02/27/17 0550 02/28/17 1110 03/01/17 0357  WBC 12.5* 10.0 10.5 12.1* 11.5*  NEUTROABS  --  7.8*  --   --   --   HGB 14.4 12.3* 13.1 12.9* 12.4*  HCT 39.7 34.7* 37.5* 37.8* 36.8*  MCV  96.6 96.7 99.5 100.5* 100.5*  PLT 136* 111* 104* 96* 95*   Basic Metabolic Panel:  Recent Labs Lab 02/25/17 1955 02/26/17 0446 02/27/17 0550 02/28/17 1110 03/01/17 0357  NA 122* 125* 127* 132* 129*  K 5.2* 4.5 5.2* 5.0 4.8  CL 89* 94* 98* 104 100*  CO2 24 22 22  21* 21*  GLUCOSE 176* 141* 110* 94 112*  BUN 81* 70* 49* 28* 29*  CREATININE 1.67* 1.62* 1.34* 1.16 1.20  CALCIUM 8.2* 8.3* 8.6* 8.2* 8.4*   GFR: Estimated Creatinine Clearance: 68.1 mL/min (by C-G formula based on SCr of 1.2 mg/dL). Liver Function Tests:  Recent Labs Lab 02/25/17 1401 02/26/17 0446 02/27/17 0550 02/28/17 1110  AST 32 29 31 28   ALT 25 24 24 26   ALKPHOS 216* 183* 186* 208*  BILITOT 3.1* 3.4* 3.2* 5.0*  PROT 8.7* 7.6 7.8 7.8  ALBUMIN 3.8 3.3* 3.3* 2.8*   No results for input(s): LIPASE, AMYLASE in the last 168 hours.  Recent Labs Lab 02/25/17 1411 02/26/17 0446 02/28/17 1110 03/01/17 1021  AMMONIA 75* 48* 36* 25   Coagulation Profile:  Recent Labs Lab 02/25/17 1401 02/26/17 0446 02/27/17 0550 02/28/17 0439 03/01/17 0357  INR 1.71 1.88 2.31 2.76 2.48   Cardiac Enzymes:  Recent Labs Lab 02/25/17 2350 02/26/17 0446 02/27/17 0550 02/27/17 1226 02/27/17 1824  TROPONINI 0.03* 0.04* 0.03* 0.03* 0.03*   BNP (last 3 results) No results for input(s): PROBNP in the last 8760 hours. HbA1C: No results for input(s): HGBA1C in the last 72 hours. CBG:  Recent Labs Lab 02/28/17 1143 02/28/17 1550 02/28/17 1937 03/01/17 0744 03/01/17 1157  GLUCAP 88 127* 107* 107* 131*   Lipid Profile: No results for input(s): CHOL, HDL, LDLCALC, TRIG, CHOLHDL, LDLDIRECT in the last 72 hours. Thyroid Function Tests: No results for input(s): TSH, T4TOTAL, FREET4, T3FREE, THYROIDAB in the last 72 hours. Anemia Panel: No results for input(s): VITAMINB12, FOLATE, FERRITIN, TIBC, IRON, RETICCTPCT in the last 72 hours. Urine analysis:    Component Value Date/Time   COLORURINE YELLOW  02/25/2017 [place] 02/25/2017 1355   APPEARANCEUR Clear 09/28/2013 0845   LABSPEC 1.010 02/25/2017 1355   PHURINE 5.0 02/25/2017 1355   GLUCOSEU 50 (A) 02/25/2017 1355   HGBUR NEGATIVE 02/25/2017 1355   BILIRUBINUR NEGATIVE 02/25/2017 1355   BILIRUBINUR neg 12/07/2015 0939   BILIRUBINUR Negative 09/28/2013 0845   KETONESUR NEGATIVE 02/25/2017 1355   PROTEINUR NEGATIVE 02/25/2017 1355   UROBILINOGEN negative 12/07/2015 0939   UROBILINOGEN 0.2 02/19/2014 1612   NITRITE NEGATIVE 02/25/2017 1355   LEUKOCYTESUR NEGATIVE 02/25/2017 1355   LEUKOCYTESUR Negative 09/28/2013 0845   Sepsis Labs: @LABRCNTIP (procalcitonin:4,lacticidven:4)  ) Recent Results (from the past 240 hour(s))  MRSA PCR Screening     Status: Abnormal   Collection Time: 02/26/17  5:20 AM  Result Value Ref Range Status   MRSA by PCR INVALID RESULTS, SPECIMEN SENT FOR CULTURE (  A) NEGATIVE Final    Comment: RESULT CALLED TO, READ BACK BY AND VERIFIED WITH: PHILLIPS C. AT 0758A ON [id number] BY THOMPSON S.   MRSA culture     Status: None   Collection Time: 02/26/17  5:20 AM  Result Value Ref Range Status   Specimen Description NOSE  Final   Special Requests NONE  Final   Culture   Final    NO MRSA DETECTED Performed at [facility], [address], [place], [place] [address]    Report Status 02/28/2017 FINAL  Final  MRSA PCR Screening     Status: None   Collection Time: 02/28/17  6:57 AM  Result Value Ref Range Status   MRSA by PCR NEGATIVE NEGATIVE Final    Comment:        The GeneXpert MRSA Assay (FDA approved for NASAL specimens only), is one component of a comprehensive MRSA colonization surveillance program. It is not intended to diagnose MRSA infection nor to guide or monitor treatment for MRSA infections.       Radiology Studies: Dg Chest Port 1 View  Result Date: 02/28/2017 CLINICAL DATA:  Cough for 1 day EXAM: PORTABLE CHEST 1 VIEW COMPARISON:  February 25, 2017 FINDINGS:  There is mild atelectasis in the left base, stable. There is no edema or consolidation. There is cardiomegaly with pulmonary vascularity within normal limits. No adenopathy. There is atherosclerotic calcification aorta. No bone lesions. IMPRESSION: Stable cardiomegaly. Aortic atherosclerosis. Atelectasis left base. No frank edema or consolidation. Electronically Signed   By: Lowella Grip III M.D.   On: 02/28/2017 08:58     Scheduled Meds: . calcitRIOL  0.5 mcg Oral Daily  . febuxostat  40 mg Oral Daily  . metoprolol tartrate  12.5 mg Oral BID  . pantoprazole  40 mg Oral BID AC   Continuous Infusions: . dextrose 50 mL/hr at 03/01/17 1200     LOS: 4 days   Time Spent in minutes   30 minutes  Quantay Zaremba D.O. on 03/01/2017 at 1:36 PM  Between 7am to 7pm - Pager - [phone]  After 7pm go to [email or web address] - password [email or web address]  And look for the night coverage person covering for me after hours  [facility] Office  [phone]

## 2017-03-01 NOTE — Progress Notes (Signed)
[place] for Warfarin (hold) >>heparin Indication: atrial fibrillation  Labs:  Recent Labs  02/27/17 0550 02/27/17 1226 02/27/17 1824 02/28/17 0439 02/28/17 1110 03/01/17 0357  HGB 13.1  --   --   --  12.9* 12.4*  HCT 37.5*  --   --   --  37.8* 36.8*  PLT 104*  --   --   --  96* 95*  LABPROT 25.8*  --   --  29.7*  --  27.3*  INR 2.31  --   --  2.76  --  2.48  CREATININE 1.34*  --   --   --  1.16 1.20  TROPONINI 0.03* 0.03* 0.03*  --   --   --     Estimated Creatinine Clearance: 68.1 mL/min (by C-G formula based on SCr of 1.2 mg/dL).  Assessment: 76 y.o. male with medical history significant of gait abnormality, cataract, stage III chronic kidney disease, type 2 diabetes, essential tremor, gout, hyperlipidemia, hypertension, permanent atrial fibrillation on warfarin who was brought to the emergency department via EMS after he was found unresponsive by his wife.   MD planning L/RHC when INR down. Will hold warfarin and plan to start heparin once INR is less than 2.   INR still elevated but trending down to 2.4 this am. No bleeding issues noted. Last dose of warfarin was 3/21.  Goal of Therapy:  INR 2-3   Plan:  Hold warfarin for now Start heparin when INR<2 Monitor for signs and symptoms of bleeding.   Erin Hearing PharmD., BCPS Clinical Pharmacist Pager [phone] 03/01/2017 9:29 AM

## 2017-03-01 NOTE — Progress Notes (Signed)
Around 2110 the patient received a small pill (metoprolol 25 mg) with a sip of water from a cup with a straw. Patient began coughing. He was able to stop long enough to then drink his lactulose. He then couldn't stop coughing. He was coughing so hard that he started spitting up and vomiting. Called MD and received order for x-ray and xopenex breathing treatment, which was given and seemed to help a little bit. No change to chest x-ray. Patient continued coughing for over an hour like this. Patient has been sitting up at a 90 degree angle. Called doctor back since it is persisted over an hour and ordered for Morphine for cough. Administered as ordered with only a little improvement. Instructed patient not to eat or drink anything until seen by SLP. Will continue to monitor and follow plan of care.

## 2017-03-01 NOTE — Progress Notes (Signed)
Patient ID: Melvin Wang, male   DOB: Jan 28, 1941, 77 y.o.   MRN: [id number] The nursing staff reported that the patient may have aspirated. He is having persistent cough. Although the patient O2 sats were in the mid 90s, I advised to resume supplemental oxygen. Xopenex plus ipratropium nebulizer and supplemental oxygen supplement ordered. A portable chest radiograph was ordered. Please see report below.Morphine 2 mg plus Phenergan 12.5 mg IVP ordered once when necessary for cough, however cough seems to have improved spontaneously. ------------------------------------------------------------------------------------------------------------ CLINICAL DATA:  76 year old male with aspiration.  EXAM: PORTABLE CHEST 1 VIEW  COMPARISON:  Chest radiograph dated 02/28/2017  FINDINGS: There stable cardiomegaly. No vascular congestion or edema. There is silhouetting of the left hemidiaphragm likely related to cardiomegaly. Underline left lung base atelectasis or infiltrate is not entirely excluded. Clinical correlation is recommended with there is no pneumothorax. There is atherosclerotic calcification of the thoracic aorta. No acute osseous pathology.  IMPRESSION: Stable cardiomegaly. No interval change in the appearance of the Lungs. Electronically Signed   By: Anner Crete M.D.   On: 03/01/2017 21:54 ---------------------------------------------------------------------------------------------  Tennis Must, M.D. [phone].

## 2017-03-02 DIAGNOSIS — T17908A Unspecified foreign body in respiratory tract, part unspecified causing other injury, initial encounter: Secondary | ICD-10-CM

## 2017-03-02 DIAGNOSIS — N179 Acute kidney failure, unspecified: Secondary | ICD-10-CM

## 2017-03-02 DIAGNOSIS — I2721 Secondary pulmonary arterial hypertension: Secondary | ICD-10-CM

## 2017-03-02 LAB — BASIC METABOLIC PANEL
ANION GAP: 9 (ref 5–15)
BUN: 32 mg/dL — ABNORMAL HIGH (ref 6–20)
CO2: 24 mmol/L (ref 22–32)
CREATININE: 1.39 mg/dL — AB (ref 0.61–1.24)
Calcium: 8.4 mg/dL — ABNORMAL LOW (ref 8.9–10.3)
Chloride: 96 mmol/L — ABNORMAL LOW (ref 101–111)
GFR calc non Af Amer: 49 mL/min — ABNORMAL LOW (ref >60)
GFR, EST AFRICAN AMERICAN: 56 mL/min — AB (ref >60)
Glucose, Bld: 108 mg/dL — ABNORMAL HIGH (ref 65–99)
POTASSIUM: 4.7 mmol/L (ref 3.5–5.1)
SODIUM: 129 mmol/L — AB (ref 135–145)

## 2017-03-02 LAB — GLUCOSE, CAPILLARY
GLUCOSE-CAPILLARY: 88 mg/dL (ref 65–99)
GLUCOSE-CAPILLARY: 131 mg/dL — AB (ref 65–99)
GLUCOSE-CAPILLARY: 122 mg/dL — AB (ref 65–99)
GLUCOSE-CAPILLARY: 95 mg/dL (ref 65–99)

## 2017-03-02 LAB — AMMONIA: AMMONIA: 24 umol/L (ref 9–35)

## 2017-03-02 LAB — CBC
HCT: 36.2 % — ABNORMAL LOW (ref 39.0–52.0)
Hemoglobin: 12.2 g/dL — ABNORMAL LOW (ref 13.0–17.0)
MCH: 33.8 pg (ref 26.0–34.0)
MCHC: 33.7 g/dL (ref 30.0–36.0)
MCV: 100.3 fL — ABNORMAL HIGH (ref 78.0–100.0)
Platelets: 96 10*3/uL — ABNORMAL LOW (ref 150–400)
RBC: 3.61 MIL/uL — AB (ref 4.22–5.81)
RDW: 16.1 % — ABNORMAL HIGH (ref 11.5–15.5)
WBC: 9.3 10*3/uL (ref 4.0–10.5)

## 2017-03-02 LAB — PROTIME-INR
INR: 2.17
Prothrombin Time: 24.5 seconds — ABNORMAL HIGH (ref 11.4–15.2)

## 2017-03-02 MED ORDER — LACTULOSE 10 GM/15ML PO SOLN
20.0000 g | Freq: Every day | ORAL | Status: DC
  Administered 2017-03-02 – 2017-03-05 (×2): 20 g via ORAL
  Filled 2017-03-02 (×3): qty 30

## 2017-03-02 NOTE — Progress Notes (Signed)
[place] for Warfarin (hold) >>heparin when INR <2 Indication: atrial fibrillation  Labs:  Recent Labs  02/27/17 1226 02/27/17 1824 02/28/17 0439  02/28/17 1110 03/01/17 0357 03/02/17 0328 03/02/17 0801  HGB  --   --   --   < > 12.9* 12.4*  --  12.2*  HCT  --   --   --   --  37.8* 36.8*  --  36.2*  PLT  --   --   --   --  96* 95*  --  96*  LABPROT  --   --  29.7*  --   --  27.3* 24.5*  --   INR  --   --  2.76  --   --  2.48 2.17  --   CREATININE  --   --   --   --  1.16 1.20  --  1.39*  TROPONINI 0.03* 0.03*  --   --   --   --   --   --   < > = values in this interval not displayed.  Estimated Creatinine Clearance: 58.8 mL/min (A) (by C-G formula based on SCr of 1.39 mg/dL (H)).  Assessment: 76 y.o. male with medical history significant of gait abnormality, cataract, stage III chronic kidney disease, type 2 diabetes, essential tremor, gout, hyperlipidemia, hypertension, permanent atrial fibrillation on warfarin who was brought to the emergency department via EMS after he was found unresponsive by his wife.   MD planning L/RHC when INR down. Continue to hold warfarin and plan to start heparin once INR is less than 2.   INR still elevated but trending down to 2.417 this am. No bleeding issues noted. Last dose of warfarin was 3/21.  Goal of Therapy:  INR 2-3   Plan:  Start heparin when INR<2 Monitor for signs and symptoms of bleeding.  Plan for cath tomorrow  Erin Hearing PharmD., BCPS Clinical Pharmacist Pager [phone] 03/02/2017 9:14 AM

## 2017-03-02 NOTE — Progress Notes (Signed)
Advanced Heart Failure Rounding Note   Subjective:    IV amio started 3/24 for AF with RVR with rates up to 160. Rates much better today. 60-100.   Also started back on diuretics. Breathing better. Able to ambulate room with less SOB.  No encephalopathy. INR 2.17    Objective:   Weight Range:  Vital Signs:   Temp:  [97.6 F (36.4 C)-98 F (36.7 C)] 97.6 F (36.4 C) (03/25 1148) Pulse Rate:  [48-147] 75 (03/25 1200) Resp:  [17-28] 22 (03/25 1200) BP: (98-132)/(62-109) 124/109 (03/25 1148) SpO2:  [92 %-97 %] 92 % (03/25 1200) Last BM Date: 03/01/17  Weight change: Filed Weights   02/26/17 0500 02/26/17 2209 02/27/17 0800  Weight: 100.1 kg (220 lb 10.9 oz) 100.2 kg (220 lb 14.4 oz) 99.7 kg (219 lb 12.8 oz)    Intake/Output:   Intake/Output Summary (Last 24 hours) at 03/02/17 1415 Last data filed at 03/02/17 1338  Gross per 24 hour  Intake           3633.1 ml  Output             1875 ml  Net           1758.1 ml     Physical Exam: General: Lying flat in bed. NAD wife at bedside HEENT: normal Neck: supple. JVP ~9 prominent CV waves. Carotids 2+ bilat; no bruits. No lymphadenopathy or thryomegaly appreciated. Cor: PMI nondisplaced. IRR IRR rate improved 2/6 TR +RV lift Lungs: clear anteriorly  No wheeze Abdomen: soft, nontender, Nondistended. No hepatosplenomegaly. No bruits or masses. Good bowel sounds. Extremities: no cyanosis, clubbing, rash, trivial edema Neuro: alert & orientedx3, cranial nerves grossly intact. moves all 4 extremities w/o difficulty. Affect pleasant  Telemetry: AF 60-100 +PVCs  Personally reviewed    Labs: Basic Metabolic Panel:  Recent Labs Lab 02/26/17 0446 02/27/17 0550 02/28/17 1110 03/01/17 0357 03/02/17 0801  NA 125* 127* 132* 129* 129*  K 4.5 5.2* 5.0 4.8 4.7  CL 94* 98* 104 100* 96*  CO2 22 22 21* 21* 24  GLUCOSE 141* 110* 94 112* 108*  BUN 70* 49* 28* 29* 32*  CREATININE 1.62* 1.34* 1.16 1.20 1.39*  CALCIUM 8.3*  8.6* 8.2* 8.4* 8.4*    Liver Function Tests:  Recent Labs Lab 02/25/17 1401 02/26/17 0446 02/27/17 0550 02/28/17 1110  AST 32 29 31 28   ALT 25 24 24 26   ALKPHOS 216* 183* 186* 208*  BILITOT 3.1* 3.4* 3.2* 5.0*  PROT 8.7* 7.6 7.8 7.8  ALBUMIN 3.8 3.3* 3.3* 2.8*   No results for input(s): LIPASE, AMYLASE in the last 168 hours.  Recent Labs Lab 02/28/17 1110 03/01/17 1021 03/02/17 0801  AMMONIA 36* 25 24    CBC:  Recent Labs Lab 02/26/17 0446 02/27/17 0550 02/28/17 1110 03/01/17 0357 03/02/17 0801  WBC 10.0 10.5 12.1* 11.5* 9.3  NEUTROABS 7.8*  --   --   --   --   HGB 12.3* 13.1 12.9* 12.4* 12.2*  HCT 34.7* 37.5* 37.8* 36.8* 36.2*  MCV 96.7 99.5 100.5* 100.5* 100.3*  PLT 111* 104* 96* 95* 96*    Cardiac Enzymes:  Recent Labs Lab 02/25/17 2350 02/26/17 0446 02/27/17 0550 02/27/17 1226 02/27/17 1824  TROPONINI 0.03* 0.04* 0.03* 0.03* 0.03*    BNP: BNP (last 3 results) No results for input(s): BNP in the last 8760 hours.  ProBNP (last 3 results) No results for input(s): PROBNP in the last 8760 hours.    Other results:  Imaging: Dg Chest Port 1 View  Result Date: 03/01/2017 CLINICAL DATA:  76 year old male with aspiration. EXAM: PORTABLE CHEST 1 VIEW COMPARISON:  Chest radiograph dated 02/28/2017 FINDINGS: There stable cardiomegaly. No vascular congestion or edema. There is silhouetting of the left hemidiaphragm likely related to cardiomegaly. Underline left lung base atelectasis or infiltrate is not entirely excluded. Clinical correlation is recommended with there is no pneumothorax. There is atherosclerotic calcification of the thoracic aorta. No acute osseous pathology. IMPRESSION: Stable cardiomegaly. No interval change in the appearance of the lungs. Electronically Signed   By: Anner Crete M.D.   On: 03/01/2017 21:54     Medications:     Scheduled Medications: . calcitRIOL  0.5 mcg Oral Daily  . febuxostat  40 mg Oral Daily  .  lactulose  20 g Oral Daily  . metoprolol tartrate  25 mg Oral BID  . pantoprazole  40 mg Oral BID AC  . spironolactone  25 mg Oral Daily  . torsemide  40 mg Oral BID    Infusions: . amiodarone 30 mg/hr (03/02/17 0202)  . dextrose 50 mL/hr at 03/01/17 1915    PRN Medications: ipratropium, levalbuterol, ondansetron **OR** ondansetron (ZOFRAN) IV, promethazine   Assessment:   1. Acute on chronic diastolic CHF 2. Hypoglycemia 3. Unstable angina 4. Chronic Atrial fibrillation now with RVR 5. Hyponatremia/hyperkalemia 6. Cirrhosis of the liver with ascites 7. Type 2 diabetes mellitus  8. 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 If cirrhosis is primary issue porto-pulmonary PAH is a possibility but he has no real RFs for primary cirrhosis. CTEPH alo unlikely given longstanding anticoagulation for AF.   AF rate improved today with IV amio. Will continue one more daya nd switch to po.   Volume status improved. Continue torsemide and spiro. Stop IVF  Continue lactulose for hepatic encephalopathy. No symptoms today  INR 2.17 Hold coumadin. Plan R/L HC next week when INR down. Start heparin when INR < 2.0. Discussed with pharmD.  Long talk with him and his family today about prognosis and my concern for him. He stated he would like to be Full Code "at least one time" but would not want to be a vegetable and kept alive with machines.    Length of Stay: 5   Malene Blaydes MD 03/02/2017, 2:15 PM  Advanced Heart Failure Team Pager [phone] (M-F; [place])  Please contact [facility] for night-coverage after hours (4p -7a ) and weekends on [email or web address]

## 2017-03-02 NOTE — Evaluation (Signed)
Clinical/Bedside Swallow Evaluation Patient Details  Name: Melvin Wang MRN: [id number] Date of Birth: 12/31/1940  Today's Date: 03/02/2017 Time: SLP Start Time (ACUTE ONLY): 0820 SLP Stop Time (ACUTE ONLY): 0840 SLP Time Calculation (min) (ACUTE ONLY): 20 min  Past Medical History:  Past Medical History:  Diagnosis Date  . Abnormality of gait   . Cataract   . Cirrhosis of liver with ascites ([place]) 01/19/2017  . CKD (chronic kidney disease), stage III    [facility]  . Cor pulmonale (HCC)    Possible  . Essential hypertension   . Essential tremor   . Foot ulcer, right ([place]) 03/2013  . Gout   . Hyperlipidemia   . Permanent atrial fibrillation (HCC)    a. >20 years, prior DCCVs unsuccessful, INR followed by PCP.  Marland Kitchen Type 2 diabetes mellitus ([place])    Past Surgical History:  Past Surgical History:  Procedure Laterality Date  . CATARACT EXTRACTION W/PHACO Left 04/19/2013   Procedure: CATARACT EXTRACTION PHACO AND INTRAOCULAR LENS PLACEMENT (IOC);  Surgeon: Tonny Branch, MD;  Location: [facility];  Service: Ophthalmology;  Laterality: Left;  CDE 29.30  . CATARACT EXTRACTION W/PHACO Right 07/15/2013   Procedure: CATARACT EXTRACTION PHACO AND INTRAOCULAR LENS PLACEMENT (IOC);  Surgeon: Tonny Branch, MD;  Location: [facility];  Service: Ophthalmology;  Laterality: Right;  CDE:12.84  . ESOPHAGOGASTRODUODENOSCOPY N/A 01/21/2017   Dr. Oneida Alar: mild erosive gastritis and moderate erosive duodenitis (negative H.pylori). Multiple gastric polyps.    HPI:  76 year old man with medical history significant of gait abnormality, cataract, stage III chronic kidney disease, type 2 diabetes, essential tremor, gout, hyperlipidemia, hypertension, permanent atrial fibrillation, admitted to [facility] from home after found unresponsive. Per MD was intubated by EMS, extubated at [facility]. Transferred to [facility] for potential cardiac catheterization, management of acute metabolic encephalopathy, right heart failure,  hyponatremia 2/2 cirrhosis. Pt with 20 lb weight loss in recent months. CXR 3/23 Stable cardiomegaly. Aortic atherosclerosis. Atelectasis left base. No frank edema or consolidation. Referred for swallowing evaluation after pt noted to have 1.5 hour coughing spell after taking a small pill with a sip of water. No prior swallowing evaluations found in chart.    Assessment / Plan / Recommendation Clinical Impression  Patient presents with oropharyngeal swallow which appears within functional limits with adequate airway protection at bedside.  Pt's voice is noted to be hoarse, but strong; he states this is new since his hospitalization. Volitional cough is strong. No overt signs or symptoms of aspiration observed despite challenging with consecutive sips of water in excess of 3 oz. He is noted to have a tremor with movement with minimal impact on bolus retrieval. Oral control and bolus clearance adequate, swallow appears timely, vital signs stable. Recommend regular diet with thin liquids, medications whole, can be taken with puree if difficulties with liquids. Provided education to patient and family regarding swallow physiology, general aspiration precautions, f/u with ENT should vocal quality not resolve. No further skilled SLP services recommended at this time. SLP will s/o.  SLP Visit Diagnosis: Dysphagia, unspecified (R13.10)    Aspiration Risk  Mild aspiration risk    Diet Recommendation Regular;Thin liquid   Liquid Administration via: Cup;Straw Medication Administration: Whole meds with puree Supervision: Patient able to self feed Compensations: Slow rate;Small sips/bites Postural Changes: Seated upright at 90 degrees    Other  Recommendations Oral Care Recommendations: Oral care BID   Follow up Recommendations None      Frequency and Duration  Prognosis        Swallow Study   General Date of Onset: 02/25/17 HPI: 76 year old man with medical history significant of gait  abnormality, cataract, stage III chronic kidney disease, type 2 diabetes, essential tremor, gout, hyperlipidemia, hypertension, permanent atrial fibrillation, admitted to [facility] from home after found unresponsive. Per MD was intubated by EMS, extubated at [facility]. Transferred to [facility] for potential cardiac catheterization, management of acute metabolic encephalopathy, right heart failure, hyponatremia 2/2 cirrhosis. Pt with 20 lb weight loss in recent months. CXR 3/23 Stable cardiomegaly. Aortic atherosclerosis. Atelectasis left base. No frank edema or consolidation. Referred for swallowing evaluation after pt noted to have 1.5 hour coughing spell after taking a small pill with a sip of water. No prior swallowing evaluations found in chart.  Type of Study: Bedside Swallow Evaluation Previous Swallow Assessment: none in chart Diet Prior to this Study: NPO Temperature Spikes Noted: No Respiratory Status: Room air History of Recent Intubation: Yes Length of Intubations (days): 1 days Date extubated: 02/25/17 Behavior/Cognition: Alert;Cooperative Oral Cavity Assessment: Within Functional Limits Oral Care Completed by SLP: No Oral Cavity - Dentition: Missing dentition;Poor condition Vision: Functional for self-feeding Self-Feeding Abilities: Able to feed self Patient Positioning: Upright in bed Baseline Vocal Quality: Hoarse Volitional Cough: Strong Volitional Swallow: Able to elicit    Oral/Motor/Sensory Function Overall Oral Motor/Sensory Function: Within functional limits   Ice Chips Ice chips: Within functional limits Presentation: Spoon   Thin Liquid Thin Liquid: Within functional limits Presentation: Straw;Cup;Self Fed    Nectar Thick Nectar Thick Liquid: Not tested   Honey Thick Honey Thick Liquid: Not tested   Puree Puree: Within functional limits Presentation: Spoon;Self Fed   Solid   GO   Solid: Within functional limits Presentation: [place],  [place] CF-SLP Speech-Language Pathologist [phone]  Aliene Altes 03/02/2017,8:52 AM

## 2017-03-02 NOTE — Progress Notes (Addendum)
PROGRESS NOTE    Melvin Wang  PYP:[id number] DOB: [birth year] DOA: 02/25/2017 PCP: Claretta Fraise, MD   Chief Complaint  Patient presents with  . Hypoglycemia    Brief Narrative:  76 year old man admitted to the hospital on 3/20 from home after found unresponsive, upon EMS arrival his CBG was 23. Sugars have responded to dextrose administration. Overnight events of 3/21-3/22 noted. Patient developed substernal chest pain with lateral changes on EKG. Has been seen by cardiology and is currently awaiting transfer to [facility] for potential cardiac catheterization. Assessment & Plan   Acute metabolic encephalopathy -Appears to have resolved -Due to hypoglycemia.  Hypoglycemia -Upon EMS arrival, blood glucose 23 -Patient has lost 25lbs  recently  -placed on D5  -hemoglobin A1C 5.4 on 01/19/2017  Substernal chest pain/unstable angina -With nonspecific ST changes laterally on EKG. -Cardiology consulted and appreciated, transferred to [facility] for potential cardiac catheterization -Troponins trended, and flat (0.03-0.04) -Plan for right and left heart cath on 3/26  Right heart failure -Cardiology consulted and appreciated -Plan for RHC/LHC on Monday 03/03/2017 -was on IV lasix, and transitioned to spironolactone and torsemide  Aspiration -patient aspirated on metoprolol pill yesterday evening -CXR unremarkable -speech therapy consulted overnight and has placed patient on heart healthy diet, regular thin liquids  Thrombocytopenia -Platelets currently 96 -Currently on no anticoagulation -possible related to cirrhosis vs dilutional? -Continue to monitor CBC  Hypotension -Patient was on IV lopressors, which has been discontinued -Currently on IVF- only D5 -Cardiology started PO metoprolol for rate control of Afib  Hyponatremia -Due to cirrhosis, worse than baseline, suspect in part due to some mild dehydration. -Improved with IV fluids -Currently 129,  continue to monitor BMP  Permanent atrial fibrillation -Rate controlled, warfarin remains on hold due to potential of cardiac catheterization. -Last INR was 2.17 today  Cirrhosis of the liver -Lactulose dose reduced to daily -ammonia level 24 -Followed by Dr. Barney Drain   Chronic kidney disease, stage III -Creatinine 139 today -Continue to monitor BMP  DVT Prophylaxis  INR therapeutic. Will start heparin when INR <2  Code Status: Full  Family Communication: Wife at bedside  Disposition Plan: Admitted. Pending heart cath on Monday, 3/26  Consultants Cardiology  Procedures  None  Antibiotics   Anti-infectives    None      Subjective:   Melvin Wang seen and examined today.  Patient feels better this morning. Able to sleep better last night. Did have a coughing spell overnight after aspirating on metoprolol. Currently denies chest pain. Does have some shortness of breath when going to the bathroom.   Objective:   Vitals:   03/02/17 0000 03/02/17 0300 03/02/17 0400 03/02/17 0744  BP: 98/63 99/68 99/68  99/77  Pulse: (!) 48 71 82 86  Resp: (!) 21 19 17  (!) 24  Temp:  97.6 F (36.4 C)  97.8 F (36.6 C)  TempSrc:  Oral  Oral  SpO2: 93% 93% 92% 97%  Weight:      Height:        Intake/Output Summary (Last 24 hours) at 03/02/17 0751 Last data filed at 03/02/17 0400  Gross per 24 hour  Intake           2886.2 ml  Output             1825 ml  Net           1061.2 ml   Filed Weights   02/26/17 0500 02/26/17 2209 02/27/17 0800  Weight: 100.1 kg (220 lb  10.9 oz) 100.2 kg (220 lb 14.4 oz) 99.7 kg (219 lb 12.8 oz)    Exam  General: Well developed, well nourished, NAD, appears stated age  93: NCAT, mucous membranes moist. Poor dentition   Neck: Supple, + JVD, no masses  Cardiovascular: S1 S2 auscultated, irregularly irregular, 2/6 murmur  Respiratory: Diminished but clear breath sounds  Abdomen: Soft, nontender, nondistended, + bowel  sounds  Extremities: warm dry without cyanosis clubbing. Trace edema LE   Neuro: AAOx3, nonfocal  Psych: Appropriate mood and affect, pleasant   Data Reviewed: I have personally reviewed following labs and imaging studies  CBC:  Recent Labs Lab 02/25/17 1401 02/26/17 0446 02/27/17 0550 02/28/17 1110 03/01/17 0357  WBC 12.5* 10.0 10.5 12.1* 11.5*  NEUTROABS  --  7.8*  --   --   --   HGB 14.4 12.3* 13.1 12.9* 12.4*  HCT 39.7 34.7* 37.5* 37.8* 36.8*  MCV 96.6 96.7 99.5 100.5* 100.5*  PLT 136* 111* 104* 96* 95*   Basic Metabolic Panel:  Recent Labs Lab 02/25/17 1955 02/26/17 0446 02/27/17 0550 02/28/17 1110 03/01/17 0357  NA 122* 125* 127* 132* 129*  K 5.2* 4.5 5.2* 5.0 4.8  CL 89* 94* 98* 104 100*  CO2 24 22 22  21* 21*  GLUCOSE 176* 141* 110* 94 112*  BUN 81* 70* 49* 28* 29*  CREATININE 1.67* 1.62* 1.34* 1.16 1.20  CALCIUM 8.2* 8.3* 8.6* 8.2* 8.4*   GFR: Estimated Creatinine Clearance: 68.1 mL/min (by C-G formula based on SCr of 1.2 mg/dL). Liver Function Tests:  Recent Labs Lab 02/25/17 1401 02/26/17 0446 02/27/17 0550 02/28/17 1110  AST 32 29 31 28   ALT 25 24 24 26   ALKPHOS 216* 183* 186* 208*  BILITOT 3.1* 3.4* 3.2* 5.0*  PROT 8.7* 7.6 7.8 7.8  ALBUMIN 3.8 3.3* 3.3* 2.8*   No results for input(s): LIPASE, AMYLASE in the last 168 hours.  Recent Labs Lab 02/25/17 1411 02/26/17 0446 02/28/17 1110 03/01/17 1021  AMMONIA 75* 48* 36* 25   Coagulation Profile:  Recent Labs Lab 02/26/17 0446 02/27/17 0550 02/28/17 0439 03/01/17 0357 03/02/17 0328  INR 1.88 2.31 2.76 2.48 2.17   Cardiac Enzymes:  Recent Labs Lab 02/25/17 2350 02/26/17 0446 02/27/17 0550 02/27/17 1226 02/27/17 1824  TROPONINI 0.03* 0.04* 0.03* 0.03* 0.03*   BNP (last 3 results) No results for input(s): PROBNP in the last 8760 hours. HbA1C: No results for input(s): HGBA1C in the last 72 hours. CBG:  Recent Labs Lab 03/01/17 1157 03/01/17 1620 03/01/17 2011  03/01/17 2130 03/02/17 0742  GLUCAP 131* 131* 136* 115* 88   Lipid Profile: No results for input(s): CHOL, HDL, LDLCALC, TRIG, CHOLHDL, LDLDIRECT in the last 72 hours. Thyroid Function Tests: No results for input(s): TSH, T4TOTAL, FREET4, T3FREE, THYROIDAB in the last 72 hours. Anemia Panel: No results for input(s): VITAMINB12, FOLATE, FERRITIN, TIBC, IRON, RETICCTPCT in the last 72 hours. Urine analysis:    Component Value Date/Time   COLORURINE YELLOW 02/25/2017 [place] 02/25/2017 1355   APPEARANCEUR Clear 09/28/2013 0845   LABSPEC 1.010 02/25/2017 1355   PHURINE 5.0 02/25/2017 1355   GLUCOSEU 50 (A) 02/25/2017 1355   HGBUR NEGATIVE 02/25/2017 1355   BILIRUBINUR NEGATIVE 02/25/2017 1355   BILIRUBINUR neg 12/07/2015 0939   BILIRUBINUR Negative 09/28/2013 0845   KETONESUR NEGATIVE 02/25/2017 1355   PROTEINUR NEGATIVE 02/25/2017 1355   UROBILINOGEN negative 12/07/2015 0939   UROBILINOGEN 0.2 02/19/2014 1612   NITRITE NEGATIVE 02/25/2017 1355   LEUKOCYTESUR NEGATIVE  02/25/2017 1355   LEUKOCYTESUR Negative 09/28/2013 0845   Sepsis Labs: @LABRCNTIP (procalcitonin:4,lacticidven:4)  ) Recent Results (from the past 240 hour(s))  MRSA PCR Screening     Status: Abnormal   Collection Time: 02/26/17  5:20 AM  Result Value Ref Range Status   MRSA by PCR INVALID RESULTS, SPECIMEN SENT FOR CULTURE (A) NEGATIVE Final    Comment: RESULT CALLED TO, READ BACK BY AND VERIFIED WITH: PHILLIPS C. AT 0758A ON [id number] BY THOMPSON S.   MRSA culture     Status: None   Collection Time: 02/26/17  5:20 AM  Result Value Ref Range Status   Specimen Description NOSE  Final   Special Requests NONE  Final   Culture   Final    NO MRSA DETECTED Performed at [facility], [address] [place], [place] [address]    Report Status 02/28/2017 FINAL  Final  MRSA PCR Screening     Status: None   Collection Time: 02/28/17  6:57 AM  Result Value Ref Range Status   MRSA by PCR  NEGATIVE NEGATIVE Final    Comment:        The GeneXpert MRSA Assay (FDA approved for NASAL specimens only), is one component of a comprehensive MRSA colonization surveillance program. It is not intended to diagnose MRSA infection nor to guide or monitor treatment for MRSA infections.       Radiology Studies: Dg Chest Port 1 View  Result Date: 03/01/2017 CLINICAL DATA:  76 year old male with aspiration. EXAM: PORTABLE CHEST 1 VIEW COMPARISON:  Chest radiograph dated 02/28/2017 FINDINGS: There stable cardiomegaly. No vascular congestion or edema. There is silhouetting of the left hemidiaphragm likely related to cardiomegaly. Underline left lung base atelectasis or infiltrate is not entirely excluded. Clinical correlation is recommended with there is no pneumothorax. There is atherosclerotic calcification of the thoracic aorta. No acute osseous pathology. IMPRESSION: Stable cardiomegaly. No interval change in the appearance of the lungs. Electronically Signed   By: Anner Crete M.D.   On: 03/01/2017 21:54   Dg Chest Port 1 View  Result Date: 02/28/2017 CLINICAL DATA:  Cough for 1 day EXAM: PORTABLE CHEST 1 VIEW COMPARISON:  February 25, 2017 FINDINGS: There is mild atelectasis in the left base, stable. There is no edema or consolidation. There is cardiomegaly with pulmonary vascularity within normal limits. No adenopathy. There is atherosclerotic calcification aorta. No bone lesions. IMPRESSION: Stable cardiomegaly. Aortic atherosclerosis. Atelectasis left base. No frank edema or consolidation. Electronically Signed   By: Lowella Grip III M.D.   On: 02/28/2017 08:58     Scheduled Meds: . calcitRIOL  0.5 mcg Oral Daily  . febuxostat  40 mg Oral Daily  . lactulose  20 g Oral Daily  . metoprolol tartrate  25 mg Oral BID  . pantoprazole  40 mg Oral BID AC  . spironolactone  25 mg Oral Daily  . torsemide  40 mg Oral BID   Continuous Infusions: . amiodarone 30 mg/hr (03/02/17 0202)   . dextrose 50 mL/hr at 03/01/17 1915     LOS: 5 days   Time Spent in minutes   30 minutes  Andre Gallego D.O. on 03/02/2017 at 7:51 AM  Between 7am to 7pm - Pager - [phone]  After 7pm go to [email or web address] - password [email or web address]  And look for the night coverage person covering for me after hours  [facility] Office  [phone]

## 2017-03-03 LAB — BASIC METABOLIC PANEL
ANION GAP: 12 (ref 5–15)
Anion gap: 10 (ref 5–15)
BUN: 38 mg/dL — ABNORMAL HIGH (ref 6–20)
BUN: 40 mg/dL — AB (ref 6–20)
CALCIUM: 8.3 mg/dL — AB (ref 8.9–10.3)
CHLORIDE: 95 mmol/L — AB (ref 101–111)
CO2: 21 mmol/L — ABNORMAL LOW (ref 22–32)
CO2: 26 mmol/L (ref 22–32)
Calcium: 8.5 mg/dL — ABNORMAL LOW (ref 8.9–10.3)
Chloride: 95 mmol/L — ABNORMAL LOW (ref 101–111)
Creatinine, Ser: 1.41 mg/dL — ABNORMAL HIGH (ref 0.61–1.24)
Creatinine, Ser: 1.48 mg/dL — ABNORMAL HIGH (ref 0.61–1.24)
GFR calc Af Amer: 56 mL/min — ABNORMAL LOW (ref >60)
GFR calc Af Amer: 52 mL/min — ABNORMAL LOW (ref >60)
GFR calc non Af Amer: 45 mL/min — ABNORMAL LOW (ref >60)
GFR, EST NON AFRICAN AMERICAN: 48 mL/min — AB (ref >60)
GLUCOSE: 124 mg/dL — AB (ref 65–99)
Glucose, Bld: 91 mg/dL (ref 65–99)
POTASSIUM: 4.5 mmol/L (ref 3.5–5.1)
Potassium: 5.7 mmol/L — ABNORMAL HIGH (ref 3.5–5.1)
Sodium: 128 mmol/L — ABNORMAL LOW (ref 135–145)
Sodium: 131 mmol/L — ABNORMAL LOW (ref 135–145)

## 2017-03-03 LAB — CBC
HEMATOCRIT: 34.6 % — AB (ref 39.0–52.0)
Hemoglobin: 12 g/dL — ABNORMAL LOW (ref 13.0–17.0)
MCH: 34.4 pg — AB (ref 26.0–34.0)
MCHC: 34.7 g/dL (ref 30.0–36.0)
MCV: 99.1 fL (ref 78.0–100.0)
Platelets: 103 10*3/uL — ABNORMAL LOW (ref 150–400)
RBC: 3.49 MIL/uL — AB (ref 4.22–5.81)
RDW: 16.3 % — ABNORMAL HIGH (ref 11.5–15.5)
WBC: 8.2 10*3/uL (ref 4.0–10.5)

## 2017-03-03 LAB — GLUCOSE, CAPILLARY
GLUCOSE-CAPILLARY: 102 mg/dL — AB (ref 65–99)
GLUCOSE-CAPILLARY: 135 mg/dL — AB (ref 65–99)
Glucose-Capillary: 90 mg/dL (ref 65–99)
Glucose-Capillary: 92 mg/dL (ref 65–99)
Glucose-Capillary: 118 mg/dL — ABNORMAL HIGH (ref 65–99)

## 2017-03-03 LAB — HEPARIN LEVEL (UNFRACTIONATED): Heparin Unfractionated: 0.14 IU/mL — ABNORMAL LOW (ref 0.30–0.70)

## 2017-03-03 LAB — PROTIME-INR
INR: 1.81
Prothrombin Time: 21.2 seconds — ABNORMAL HIGH (ref 11.4–15.2)

## 2017-03-03 MED ORDER — SODIUM CHLORIDE 0.9% FLUSH: 3.0000 mL | INTRAVENOUS | Status: DC | PRN

## 2017-03-03 MED ORDER — HEPARIN (PORCINE) IN NACL 100-0.45 UNIT/ML-% IJ SOLN
1400.0000 [IU]/h | INTRAMUSCULAR | Status: DC
  Administered 2017-03-03: 1100 [IU]/h via INTRAVENOUS
  Administered 2017-03-04: 1300 [IU]/h via INTRAVENOUS
  Filled 2017-03-03 (×2): qty 250

## 2017-03-03 MED ORDER — SODIUM POLYSTYRENE SULFONATE 15 GM/60ML PO SUSP
15.0000 g | Freq: Once | ORAL | Status: DC
  Filled 2017-03-03: qty 60

## 2017-03-03 MED ORDER — AMIODARONE IV BOLUS ONLY 150 MG/100ML
150.0000 mg | Freq: Once | INTRAVENOUS | Status: AC
  Administered 2017-03-03: 150 mg via INTRAVENOUS
  Filled 2017-03-03: qty 100

## 2017-03-03 MED ORDER — SODIUM CHLORIDE 0.9 % IV SOLN: 250.0000 mL | INTRAVENOUS | Status: DC | PRN

## 2017-03-03 MED ORDER — SODIUM CHLORIDE 0.9% FLUSH
3.0000 mL | Freq: Two times a day (BID) | INTRAVENOUS | Status: DC
  Administered 2017-03-04: 3 mL via INTRAVENOUS

## 2017-03-03 MED ORDER — ASPIRIN 81 MG PO CHEW
81.0000 mg | CHEWABLE_TABLET | ORAL | Status: AC
  Administered 2017-03-04: 81 mg via ORAL
  Filled 2017-03-03: qty 1

## 2017-03-03 MED ORDER — SODIUM CHLORIDE 0.9 % IV SOLN
INTRAVENOUS | Status: DC
  Administered 2017-03-04: via INTRAVENOUS

## 2017-03-03 NOTE — Progress Notes (Signed)
[place] for Warfarin (hold) >>heparin when INR <2 Indication: atrial fibrillation  Labs:  Recent Labs  03/01/17 0357 03/02/17 0328 03/02/17 0801 03/03/17 0249  HGB 12.4*  --  12.2* 12.0*  HCT 36.8*  --  36.2* 34.6*  PLT 95*  --  96* 103*  LABPROT 27.3* 24.5*  --  21.2*  INR 2.48 2.17  --  1.81  CREATININE 1.20  --  1.39* 1.41*    Estimated Creatinine Clearance: 57.9 mL/min (A) (by C-G formula based on SCr of 1.41 mg/dL (H)).  Assessment: 76 y.o. male with medical history significant of gait abnormality, cataract, stage III chronic kidney disease, type 2 diabetes, essential tremor, gout, hyperlipidemia, hypertension, permanent atrial fibrillation on warfarin who was brought to the emergency department via EMS after he was found unresponsive by his wife.   MD planning L/RHC when INR down < 1.6 plan for 3/27. INR 1.8 start heparin drip  No bleeding issues noted. Last dose of warfarin was 3/21.  Goal of Therapy:  INR 2-3 HL 0.3-0.7  Plan:  Start heparin drip no bolus 1100 uts/hr HL 6hr after drip started and daily Monitor for signs and symptoms of bleeding.    Bonnita Nasuti Pharm.D. CPP, BCPS Clinical Pharmacist [phone] 03/03/2017 10:08 AM

## 2017-03-03 NOTE — Progress Notes (Signed)
[place] for Warfarin (hold) >>heparin when INR <2 Indication: atrial fibrillation  Labs:  Recent Labs  03/01/17 0357 03/02/17 0328 03/02/17 0801 03/03/17 0249 03/03/17 1128 03/03/17 1634  HGB 12.4*  --  12.2* 12.0*  --   --   HCT 36.8*  --  36.2* 34.6*  --   --   PLT 95*  --  96* 103*  --   --   LABPROT 27.3* 24.5*  --  21.2*  --   --   INR 2.48 2.17  --  1.81  --   --   HEPARINUNFRC  --   --   --   --   --  0.14*  CREATININE 1.20  --  1.39* 1.41* 1.48*  --     Estimated Creatinine Clearance: 55.2 mL/min (A) (by C-G formula based on SCr of 1.48 mg/dL (H)).  Assessment: 76 y.o. male with medical history significant of gait abnormality, cataract, stage III chronic kidney disease, type 2 diabetes, essential tremor, gout, hyperlipidemia, hypertension, permanent atrial fibrillation on warfarin who was brought to the emergency department via EMS after he was found unresponsive by his wife.   MD planning L/RHC when INR down < 1.6 plan for 3/27. INR 1.8 start heparin drip. Heparin level 0.14, subtherapeutic. No interruption with infusion, no bleeding noted.  Goal of Therapy:  INR 2-3 HL 0.3-0.7  Plan:  Increase heparin rate to 1300 units/hr f/u 8 hr heparin level at [place], PharmD, BCPS  Clinical Pharmacist  Pager: [phone]   03/03/2017 5:56 PM

## 2017-03-03 NOTE — Progress Notes (Addendum)
PROGRESS NOTE    Melvin Wang  WJX:[id number] DOB: 10-28-1941 DOA: 02/25/2017 PCP: Claretta Fraise, MD   Chief Complaint  Patient presents with  . Hypoglycemia    Brief Narrative:  76 year old man admitted to the hospital on 3/20 from home after found unresponsive, upon EMS arrival his CBG was 23. Sugars have responded to dextrose administration. Overnight events of 3/21-3/22 noted. Patient developed substernal chest pain with lateral changes on EKG. Has been seen by cardiology and is currently awaiting transfer to [facility] for potential cardiac catheterization. Assessment & Plan   Acute metabolic encephalopathy -Appears to have resolved -Due to hypoglycemia.  Hypoglycemia -Upon EMS arrival, blood glucose 23 -Patient has lost 25lbs  recently  -placed on D5  -hemoglobin A1C 5.4 on 01/19/2017  Substernal chest pain/unstable angina -With nonspecific ST changes laterally on EKG. -Cardiology consulted and appreciated, transferred to [facility] for potential cardiac catheterization -Troponins trended, and flat (0.03-0.04) -Plan for right and left heart cath when INR <1.7?  Right heart failure -Cardiology consulted and appreciated -Plan for RHC/LHC on Monday 03/03/2017 -was on IV lasix, and transitioned to spironolactone and torsemide -Discontinued spironolactone today given hyperkalemia  Aspiration -patient aspirated on metoprolol pill yesterday evening -CXR unremarkable -speech therapy consulted overnight and has placed patient on heart healthy diet, regular thin liquids  Thrombocytopenia -Platelets currently 103 (increasing slightly) -Currently on no anticoagulation -possible related to cirrhosis vs dilutional? -Continue to monitor CBC  Hypotension -Patient was on IV lopressors, which has been discontinued -Cardiology started PO metoprolol for rate control of Afib -BP stable  Hyponatremia -Due to cirrhosis, worse than baseline, suspect in part due to  some mild dehydration. -Was given IVF and improved mildly -Currently 128, continue to monitor BMP  Permanent atrial fibrillation -Rate controlled, warfarin remains on hold due to potential of cardiac catheterization. -Currently amiodarone drip and metoprolol 25mg  BID -Last INR was 1.81 today, heparin to be started today  Cirrhosis of the liver -Lactulose dose reduced to daily -ammonia level 24 -Followed by Dr. Barney Drain   Chronic kidney disease, stage III -Creatinine 1.41 today -Continue to monitor BMP  DVT Prophylaxis  INR therapeutic. Will start heparin when INR <2  Code Status: Full  Family Communication: Family at bedside  Disposition Plan: Admitted. Pending heart cath when INR < 1.7  Consultants Cardiology  Procedures  None  Antibiotics   Anti-infectives    None      Subjective:   Melvin Wang seen and examined today.  Patient feels better this morning. Denies shortness of breath and chest pain today. States he has been going to the bathroom often.  Feels mildly weak, would like to get his cath done.   Objective:   Vitals:   03/02/17 2005 03/02/17 2106 03/03/17 0007 03/03/17 0320  BP: 111/74  103/61 106/66  Pulse: (!) 59 (!) 115 91 97  Resp: (!) 24  (!) 23 (!) 24  Temp: 98 F (36.7 C)  98.3 F (36.8 C) 98.1 F (36.7 C)  TempSrc: Oral  Oral Oral  SpO2: 90%  96% 96%  Weight:      Height:        Intake/Output Summary (Last 24 hours) at 03/03/17 0745 Last data filed at 03/03/17 0700  Gross per 24 hour  Intake           1490.7 ml  Output             2600 ml  Net          -  1109.3 ml   Filed Weights   02/26/17 0500 02/26/17 2209 02/27/17 0800  Weight: 100.1 kg (220 lb 10.9 oz) 100.2 kg (220 lb 14.4 oz) 99.7 kg (219 lb 12.8 oz)    Exam  General: Well developed, well nourished, NAD  HEENT: NCAT, mucous membranes moist. Poor dentition   Neck: Supple, + JVD, no masses  Cardiovascular: S1 S2 auscultated, irregularly irregular, 2/6  murmur  Respiratory: Diminished but clear breath sounds  Abdomen: Soft, nontender, nondistended, + bowel sounds  Extremities: warm dry without cyanosis clubbing  Skin: lower extremity chronic skin changes  Neuro: AAOx3, nonfocal  Psych: Appropriate mood and affect, pleasant   Data Reviewed: I have personally reviewed following labs and imaging studies  CBC:  Recent Labs Lab 02/26/17 0446 02/27/17 0550 02/28/17 1110 03/01/17 0357 03/02/17 0801 03/03/17 0249  WBC 10.0 10.5 12.1* 11.5* 9.3 8.2  NEUTROABS 7.8*  --   --   --   --   --   HGB 12.3* 13.1 12.9* 12.4* 12.2* 12.0*  HCT 34.7* 37.5* 37.8* 36.8* 36.2* 34.6*  MCV 96.7 99.5 100.5* 100.5* 100.3* 99.1  PLT 111* 104* 96* 95* 96* 657*   Basic Metabolic Panel:  Recent Labs Lab 02/27/17 0550 02/28/17 1110 03/01/17 0357 03/02/17 0801 03/03/17 0249  NA 127* 132* 129* 129* 128*  K 5.2* 5.0 4.8 4.7 5.7*  CL 98* 104 100* 96* 95*  CO2 22 21* 21* 24 21*  GLUCOSE 110* 94 112* 108* 91  BUN 49* 28* 29* 32* 38*  CREATININE 1.34* 1.16 1.20 1.39* 1.41*  CALCIUM 8.6* 8.2* 8.4* 8.4* 8.3*   GFR: Estimated Creatinine Clearance: 57.9 mL/min (A) (by C-G formula based on SCr of 1.41 mg/dL (H)). Liver Function Tests:  Recent Labs Lab 02/25/17 1401 02/26/17 0446 02/27/17 0550 02/28/17 1110  AST 32 29 31 28   ALT 25 24 24 26   ALKPHOS 216* 183* 186* 208*  BILITOT 3.1* 3.4* 3.2* 5.0*  PROT 8.7* 7.6 7.8 7.8  ALBUMIN 3.8 3.3* 3.3* 2.8*   No results for input(s): LIPASE, AMYLASE in the last 168 hours.  Recent Labs Lab 02/25/17 1411 02/26/17 0446 02/28/17 1110 03/01/17 1021 03/02/17 0801  AMMONIA 75* 48* 36* 25 24   Coagulation Profile:  Recent Labs Lab 02/27/17 0550 02/28/17 0439 03/01/17 0357 03/02/17 0328 03/03/17 0249  INR 2.31 2.76 2.48 2.17 1.81   Cardiac Enzymes:  Recent Labs Lab 02/25/17 2350 02/26/17 0446 02/27/17 0550 02/27/17 1226 02/27/17 1824  TROPONINI 0.03* 0.04* 0.03* 0.03* 0.03*    BNP (last 3 results) No results for input(s): PROBNP in the last 8760 hours. HbA1C: No results for input(s): HGBA1C in the last 72 hours. CBG:  Recent Labs Lab 03/02/17 0742 03/02/17 1150 03/02/17 1650 03/02/17 2134 03/03/17 0323  GLUCAP 88 131* 122* 95 90   Lipid Profile: No results for input(s): CHOL, HDL, LDLCALC, TRIG, CHOLHDL, LDLDIRECT in the last 72 hours. Thyroid Function Tests: No results for input(s): TSH, T4TOTAL, FREET4, T3FREE, THYROIDAB in the last 72 hours. Anemia Panel: No results for input(s): VITAMINB12, FOLATE, FERRITIN, TIBC, IRON, RETICCTPCT in the last 72 hours. Urine analysis:    Component Value Date/Time   COLORURINE YELLOW 02/25/2017 [place] 02/25/2017 1355   APPEARANCEUR Clear 09/28/2013 0845   LABSPEC 1.010 02/25/2017 1355   PHURINE 5.0 02/25/2017 1355   GLUCOSEU 50 (A) 02/25/2017 1355   HGBUR NEGATIVE 02/25/2017 1355   BILIRUBINUR NEGATIVE 02/25/2017 1355   BILIRUBINUR neg 12/07/2015 0939   BILIRUBINUR Negative 09/28/2013  [place] 02/25/2017 [place] 02/25/2017 1355   UROBILINOGEN negative 12/07/2015 0939   UROBILINOGEN 0.2 02/19/2014 1612   NITRITE NEGATIVE 02/25/2017 1355   LEUKOCYTESUR NEGATIVE 02/25/2017 1355   LEUKOCYTESUR Negative 09/28/2013 0845   Sepsis Labs: @LABRCNTIP (procalcitonin:4,lacticidven:4)  ) Recent Results (from the past 240 hour(s))  MRSA PCR Screening     Status: Abnormal   Collection Time: 02/26/17  5:20 AM  Result Value Ref Range Status   MRSA by PCR INVALID RESULTS, SPECIMEN SENT FOR CULTURE (A) NEGATIVE Final    Comment: RESULT CALLED TO, READ BACK BY AND VERIFIED WITH: PHILLIPS C. AT 0758A ON [id number] BY THOMPSON S.   MRSA culture     Status: None   Collection Time: 02/26/17  5:20 AM  Result Value Ref Range Status   Specimen Description NOSE  Final   Special Requests NONE  Final   Culture   Final    NO MRSA DETECTED Performed at [facility] [address] [place], [place] [address]    Report Status 02/28/2017 FINAL  Final  MRSA PCR Screening     Status: None   Collection Time: 02/28/17  6:57 AM  Result Value Ref Range Status   MRSA by PCR NEGATIVE NEGATIVE Final    Comment:        The GeneXpert MRSA Assay (FDA approved for NASAL specimens only), is one component of a comprehensive MRSA colonization surveillance program. It is not intended to diagnose MRSA infection nor to guide or monitor treatment for MRSA infections.       Radiology Studies: Dg Chest Port 1 View  Result Date: 03/01/2017 CLINICAL DATA:  75 year old male with aspiration. EXAM: PORTABLE CHEST 1 VIEW COMPARISON:  Chest radiograph dated 02/28/2017 FINDINGS: There stable cardiomegaly. No vascular congestion or edema. There is silhouetting of the left hemidiaphragm likely related to cardiomegaly. Underline left lung base atelectasis or infiltrate is not entirely excluded. Clinical correlation is recommended with there is no pneumothorax. There is atherosclerotic calcification of the thoracic aorta. No acute osseous pathology. IMPRESSION: Stable cardiomegaly. No interval change in the appearance of the lungs. Electronically Signed   By: Anner Crete M.D.   On: 03/01/2017 21:54     Scheduled Meds: . calcitRIOL  0.5 mcg Oral Daily  . febuxostat  40 mg Oral Daily  . lactulose  20 g Oral Daily  . metoprolol tartrate  25 mg Oral BID  . pantoprazole  40 mg Oral BID AC  . sodium polystyrene  15 g Oral Once  . torsemide  40 mg Oral BID   Continuous Infusions: . amiodarone 30 mg/hr (03/03/17 0129)     LOS: 6 days   Time Spent in minutes   30 minutes  Kaan Tosh D.O. on 03/03/2017 at 7:45 AM  Between 7am to 7pm - Pager - [phone]  After 7pm go to [email or web address] - password [email or web address]  And look for the night coverage person covering for me after hours  [facility] Office  [phone]

## 2017-03-03 NOTE — Progress Notes (Signed)
Advanced Heart Failure Rounding Note   Subjective:    IV amio started 3/24 for AF with RVR with rates up to 160.   Feels better today. Remains on amio. But HR faster today. Rates in low 100s. Up to 150-170 with ambulation.  SOB with mild exertion.   Volume status stable/ No orthopnea or PND.   INR 1.8.     Objective:   Weight Range:  Vital Signs:   Temp:  [97.2 F (36.2 C)-98.3 F (36.8 C)] 98.1 F (36.7 C) (03/26 0320) Pulse Rate:  [59-115] 77 (03/26 0915) Resp:  [21-25] 21 (03/26 0915) BP: (98-124)/(61-109) 98/65 (03/26 0915) SpO2:  [90 %-99 %] 99 % (03/26 0915) Last BM Date: 03/01/17  Weight change: Filed Weights   02/26/17 0500 02/26/17 2209 02/27/17 0800  Weight: 100.1 kg (220 lb 10.9 oz) 100.2 kg (220 lb 14.4 oz) 99.7 kg (219 lb 12.8 oz)    Intake/Output:   Intake/Output Summary (Last 24 hours) at 03/03/17 0958 Last data filed at 03/03/17 0700  Gross per 24 hour  Intake           1117.3 ml  Output             3025 ml  Net          -1907.7 ml     Physical Exam: General: Lying flat in bed. NAD wife at bedside HEENT: normal Neck: supple. JVP ~8-10 + prominent CV waves . Carotids 2+ bilat; no bruits. No lymphadenopathy or thryomegaly appreciated. Cor: PMI nondisplaced. IRR IRR rate tachy 2/6 TR +RV lift Lungs: clear anteriorly  No wheeze or rhonchi  Abdomen: soft, nontender, nondistended  No hepatosplenomegaly. No bruits or masses. Good bowel sounds. Extremities: no cyanosis, clubbing, rash, trace edema Neuro: alert & orientedx3, cranial nerves grossly intact. moves all 4 extremities w/o difficulty. Affect pleasant. noasterixis  Telemetry: AF 100-120  +PVCs Personally reviewed     Labs: Basic Metabolic Panel:  Recent Labs Lab 02/27/17 0550 02/28/17 1110 03/01/17 0357 03/02/17 0801 03/03/17 0249  NA 127* 132* 129* 129* 128*  K 5.2* 5.0 4.8 4.7 5.7*  CL 98* 104 100* 96* 95*  CO2 22 21* 21* 24 21*  GLUCOSE 110* 94 112* 108* 91  BUN 49*  28* 29* 32* 38*  CREATININE 1.34* 1.16 1.20 1.39* 1.41*  CALCIUM 8.6* 8.2* 8.4* 8.4* 8.3*    Liver Function Tests:  Recent Labs Lab 02/25/17 1401 02/26/17 0446 02/27/17 0550 02/28/17 1110  AST 32 29 31 28   ALT 25 24 24 26   ALKPHOS 216* 183* 186* 208*  BILITOT 3.1* 3.4* 3.2* 5.0*  PROT 8.7* 7.6 7.8 7.8  ALBUMIN 3.8 3.3* 3.3* 2.8*   No results for input(s): LIPASE, AMYLASE in the last 168 hours.  Recent Labs Lab 02/28/17 1110 03/01/17 1021 03/02/17 0801  AMMONIA 36* 25 24    CBC:  Recent Labs Lab 02/26/17 0446 02/27/17 0550 02/28/17 1110 03/01/17 0357 03/02/17 0801 03/03/17 0249  WBC 10.0 10.5 12.1* 11.5* 9.3 8.2  NEUTROABS 7.8*  --   --   --   --   --   HGB 12.3* 13.1 12.9* 12.4* 12.2* 12.0*  HCT 34.7* 37.5* 37.8* 36.8* 36.2* 34.6*  MCV 96.7 99.5 100.5* 100.5* 100.3* 99.1  PLT 111* 104* 96* 95* 96* 103*    Cardiac Enzymes:  Recent Labs Lab 02/25/17 2350 02/26/17 0446 02/27/17 0550 02/27/17 1226 02/27/17 1824  TROPONINI 0.03* 0.04* 0.03* 0.03* 0.03*    BNP: BNP (last 3  results) No results for input(s): BNP in the last 8760 hours.  ProBNP (last 3 results) No results for input(s): PROBNP in the last 8760 hours.    Other results:  Imaging: Dg Chest Port 1 View  Result Date: 03/01/2017 CLINICAL DATA:  76 year old male with aspiration. EXAM: PORTABLE CHEST 1 VIEW COMPARISON:  Chest radiograph dated 02/28/2017 FINDINGS: There stable cardiomegaly. No vascular congestion or edema. There is silhouetting of the left hemidiaphragm likely related to cardiomegaly. Underline left lung base atelectasis or infiltrate is not entirely excluded. Clinical correlation is recommended with there is no pneumothorax. There is atherosclerotic calcification of the thoracic aorta. No acute osseous pathology. IMPRESSION: Stable cardiomegaly. No interval change in the appearance of the lungs. Electronically Signed   By: Anner Crete M.D.   On: 03/01/2017 21:54      Medications:     Scheduled Medications: . amiodarone  150 mg Intravenous Once  . calcitRIOL  0.5 mcg Oral Daily  . febuxostat  40 mg Oral Daily  . lactulose  20 g Oral Daily  . metoprolol tartrate  25 mg Oral BID  . pantoprazole  40 mg Oral BID AC  . sodium polystyrene  15 g Oral Once  . torsemide  40 mg Oral BID    Infusions: . amiodarone 30 mg/hr (03/03/17 0931)    PRN Medications: ipratropium, levalbuterol, ondansetron **OR** ondansetron (ZOFRAN) IV, promethazine   Assessment:   1. Acute on chronic diastolic CHF 2. Hypoglycemia 3. Unstable angina 4. Chronic Atrial fibrillation now with RVR 5. Hyponatremia/hyperkalemia 6. Cirrhosis of the liver with ascites 7. Type 2 diabetes mellitus  8. Acute on CKD stage III 9. 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 If cirrhosis is primary issue porto-pulmonary PAH is a possibility but he has no real RFs for primary cirrhosis. CTEPH alo unlikely given longstanding anticoagulation for AF.   AF rate back up today despite amio gtt and lopressor. Will rebolus. INR down will start heparin without bolus  Volume status improved. Continue torsemide. Arlyce Harman held due to hyperkalemia. (Sample hemolyzed so repeating)  Continue lactulose for hepatic encephalopathy.   No further CP.   INR 1.8. Plan R/L tomorrow. Start heparin no bolus. Discussed with PharmD and Dr. Ree Kida.  Long talk with him and his family about PAH and RV including pathogensis, treatment and prognosis.     Length of Stay: 6  Danh Bayus MD 03/03/2017, 9:58 AM  Advanced Heart Failure Team Pager [phone] (M-F; 7a - 4p)  Please contact [facility] for night-coverage after hours (4p -7a ) and weekends on [email or web address]

## 2017-03-04 ENCOUNTER — Encounter (HOSPITAL_COMMUNITY)
Admission: EM | Disposition: A | Discharge status: Home Health | Payer: Self-pay | Source: Home / Self Care | Attending: Internal Medicine

## 2017-03-04 ENCOUNTER — Encounter (HOSPITAL_COMMUNITY): Payer: Self-pay | Admitting: Internal Medicine

## 2017-03-04 HISTORY — PX: RIGHT/LEFT HEART CATH AND CORONARY ANGIOGRAPHY: CATH118266

## 2017-03-04 LAB — POCT I-STAT 3, VENOUS BLOOD GAS (G3P V)
ACID-BASE DEFICIT: 1 mmol/L (ref 0.0–2.0)
ACID-BASE DEFICIT: 2 mmol/L (ref 0.0–2.0)
ACID-BASE EXCESS: 1 mmol/L (ref 0.0–2.0)
BICARBONATE: 23.6 mmol/L (ref 20.0–28.0)
BICARBONATE: 26.1 mmol/L (ref 20.0–28.0)
Bicarbonate: 23.8 mmol/L (ref 20.0–28.0)
O2 SAT: 63 %
O2 SAT: 63 %
O2 SAT: 63 %
PCO2 VEN: 40 mmHg — AB (ref 44.0–60.0)
PO2 VEN: 33 mmHg (ref 32.0–45.0)
TCO2: 25 mmol/L (ref 0–100)
TCO2: 25 mmol/L (ref 0–100)
TCO2: 27 mmol/L (ref 0–100)
pCO2, Ven: 42.7 mmHg — ABNORMAL LOW (ref 44.0–60.0)
pCO2, Ven: 43 mmHg — ABNORMAL LOW (ref 44.0–60.0)
pH, Ven: 7.347 (ref 7.250–7.430)
pH, Ven: 7.383 (ref 7.250–7.430)
pH, Ven: 7.394 (ref 7.250–7.430)
pO2, Ven: 33 mmHg (ref 32.0–45.0)
pO2, Ven: 34 mmHg (ref 32.0–45.0)

## 2017-03-04 LAB — BASIC METABOLIC PANEL
Anion gap: 10 (ref 5–15)
BUN: 42 mg/dL — AB (ref 6–20)
CALCIUM: 8.4 mg/dL — AB (ref 8.9–10.3)
CO2: 25 mmol/L (ref 22–32)
Chloride: 96 mmol/L — ABNORMAL LOW (ref 101–111)
Creatinine, Ser: 1.44 mg/dL — ABNORMAL HIGH (ref 0.61–1.24)
GFR calc Af Amer: 54 mL/min — ABNORMAL LOW (ref >60)
GFR calc non Af Amer: 47 mL/min — ABNORMAL LOW (ref >60)
Glucose, Bld: 101 mg/dL — ABNORMAL HIGH (ref 65–99)
Potassium: 4.2 mmol/L (ref 3.5–5.1)
Sodium: 131 mmol/L — ABNORMAL LOW (ref 135–145)

## 2017-03-04 LAB — CBC
HCT: 33.9 % — ABNORMAL LOW (ref 39.0–52.0)
Hemoglobin: 11.8 g/dL — ABNORMAL LOW (ref 13.0–17.0)
MCH: 34.3 pg — AB (ref 26.0–34.0)
MCHC: 34.8 g/dL (ref 30.0–36.0)
MCV: 98.5 fL (ref 78.0–100.0)
PLATELETS: 112 10*3/uL — AB (ref 150–400)
RBC: 3.44 MIL/uL — ABNORMAL LOW (ref 4.22–5.81)
RDW: 16 % — AB (ref 11.5–15.5)
WBC: 7.5 10*3/uL (ref 4.0–10.5)

## 2017-03-04 LAB — GLUCOSE, CAPILLARY
GLUCOSE-CAPILLARY: 95 mg/dL (ref 65–99)
Glucose-Capillary: 87 mg/dL (ref 65–99)
Glucose-Capillary: 99 mg/dL (ref 65–99)
Glucose-Capillary: 107 mg/dL — ABNORMAL HIGH (ref 65–99)

## 2017-03-04 LAB — HEPARIN LEVEL (UNFRACTIONATED)
Heparin Unfractionated: 0.36 IU/mL (ref 0.30–0.70)
Heparin Unfractionated: <0.1 IU/mL — ABNORMAL LOW (ref 0.30–0.70)

## 2017-03-04 LAB — AMMONIA: Ammonia: 35 umol/L (ref 9–35)

## 2017-03-04 LAB — PROTIME-INR
INR: 1.78
Prothrombin Time: 20.9 seconds — ABNORMAL HIGH (ref 11.4–15.2)

## 2017-03-04 SURGERY — RIGHT/LEFT HEART CATH AND CORONARY ANGIOGRAPHY
Anesthesia: LOCAL

## 2017-03-04 MED ORDER — WARFARIN SODIUM 5 MG PO TABS
5.0000 mg | ORAL_TABLET | Freq: Once | ORAL | Status: AC
Start: 2017-03-04 — End: 2017-03-04
  Administered 2017-03-04: 5 mg via ORAL
  Filled 2017-03-04: qty 1

## 2017-03-04 MED ORDER — LIDOCAINE HCL (PF) 1 % IJ SOLN
INTRAMUSCULAR | Status: AC
  Filled 2017-03-04: qty 30

## 2017-03-04 MED ORDER — ACETAMINOPHEN 325 MG PO TABS: 650.0000 mg | ORAL_TABLET | ORAL | Status: DC | PRN

## 2017-03-04 MED ORDER — SODIUM CHLORIDE 0.9 % IV SOLN: INTRAVENOUS | Status: AC

## 2017-03-04 MED ORDER — HEPARIN (PORCINE) IN NACL 2-0.9 UNIT/ML-% IJ SOLN
INTRAMUSCULAR | Status: AC
  Filled 2017-03-04: qty 1000

## 2017-03-04 MED ORDER — LIDOCAINE HCL (PF) 1 % IJ SOLN
INTRAMUSCULAR | Status: DC | PRN
  Administered 2017-03-04 (×2): 2 mL

## 2017-03-04 MED ORDER — SODIUM CHLORIDE 0.9% FLUSH: 3.0000 mL | INTRAVENOUS | Status: DC | PRN

## 2017-03-04 MED ORDER — VERAPAMIL HCL 2.5 MG/ML IV SOLN
INTRAVENOUS | Status: AC
  Filled 2017-03-04: qty 2

## 2017-03-04 MED ORDER — FENTANYL CITRATE (PF) 100 MCG/2ML IJ SOLN
INTRAMUSCULAR | Status: AC
  Filled 2017-03-04: qty 2

## 2017-03-04 MED ORDER — WARFARIN - PHARMACIST DOSING INPATIENT
Freq: Every day | Status: DC
  Administered 2017-03-04 – 2017-03-06 (×3)

## 2017-03-04 MED ORDER — FENTANYL CITRATE (PF) 100 MCG/2ML IJ SOLN
INTRAMUSCULAR | Status: DC | PRN
  Administered 2017-03-04: 25 ug via INTRAVENOUS

## 2017-03-04 MED ORDER — HEPARIN (PORCINE) IN NACL 2-0.9 UNIT/ML-% IJ SOLN
INTRAMUSCULAR | Status: DC | PRN
  Administered 2017-03-04: 1000 mL

## 2017-03-04 MED ORDER — VERAPAMIL HCL 2.5 MG/ML IV SOLN
INTRAVENOUS | Status: DC | PRN
  Administered 2017-03-04: 10 mL via INTRA_ARTERIAL

## 2017-03-04 MED ORDER — MIDAZOLAM HCL 2 MG/2ML IJ SOLN
INTRAMUSCULAR | Status: DC | PRN
  Administered 2017-03-04: 1 mg via INTRAVENOUS

## 2017-03-04 MED ORDER — IOPAMIDOL (ISOVUE-370) INJECTION 76%
INTRAVENOUS | Status: DC | PRN
  Administered 2017-03-04: 35 mL via INTRA_ARTERIAL

## 2017-03-04 MED ORDER — HEPARIN SODIUM (PORCINE) 1000 UNIT/ML IJ SOLN
INTRAMUSCULAR | Status: DC | PRN
  Administered 2017-03-04: 4000 [IU] via INTRAVENOUS

## 2017-03-04 MED ORDER — ONDANSETRON HCL 4 MG/2ML IJ SOLN: 4.0000 mg | Freq: Four times a day (QID) | INTRAMUSCULAR | Status: DC | PRN

## 2017-03-04 MED ORDER — HEPARIN SODIUM (PORCINE) 1000 UNIT/ML IJ SOLN
INTRAMUSCULAR | Status: AC
  Filled 2017-03-04: qty 1

## 2017-03-04 MED ORDER — MIDAZOLAM HCL 2 MG/2ML IJ SOLN
INTRAMUSCULAR | Status: AC
  Filled 2017-03-04: qty 2

## 2017-03-04 MED ORDER — SODIUM CHLORIDE 0.9 % IV SOLN: 250.0000 mL | INTRAVENOUS | Status: DC | PRN

## 2017-03-04 MED ORDER — SODIUM CHLORIDE 0.9% FLUSH
3.0000 mL | Freq: Two times a day (BID) | INTRAVENOUS | Status: DC
  Administered 2017-03-05 – 2017-03-07 (×5): 3 mL via INTRAVENOUS

## 2017-03-04 MED ORDER — IOPAMIDOL (ISOVUE-370) INJECTION 76%
INTRAVENOUS | Status: AC
  Filled 2017-03-04: qty 100

## 2017-03-04 MED ORDER — HEPARIN (PORCINE) IN NACL 100-0.45 UNIT/ML-% IJ SOLN
1400.0000 [IU]/h | INTRAMUSCULAR | Status: DC
  Administered 2017-03-04 – 2017-03-07 (×4): 1400 [IU]/h via INTRAVENOUS
  Filled 2017-03-04 (×4): qty 250

## 2017-03-04 SURGICAL SUPPLY — 12 items
CATH 5FR JL3.5 JR4 ANG PIG MP (CATHETERS) ×1 IMPLANT
CATH BALLN WEDGE 5F 110CM (CATHETERS) ×1 IMPLANT
DEVICE RAD COMP TR BAND LRG (VASCULAR PRODUCTS) ×1 IMPLANT
GLIDESHEATH SLEND SS 6F .021 (SHEATH) ×1 IMPLANT
GUIDEWIRE INQWIRE 1.5J.035X260 (WIRE) IMPLANT
INQWIRE 1.5J .035X260CM (WIRE) ×2
KIT HEART LEFT (KITS) ×2 IMPLANT
PACK CARDIAC CATHETERIZATION (CUSTOM PROCEDURE TRAY) ×2 IMPLANT
SHEATH FAST CATH BRACH 5F 5CM (SHEATH) ×1 IMPLANT
TRANSDUCER W/STOPCOCK (MISCELLANEOUS) ×2 IMPLANT
TUBING CIL FLEX 10 FLL-RA (TUBING) ×2 IMPLANT
WIRE HI TORQ VERSACORE-J 145CM (WIRE) ×1 IMPLANT

## 2017-03-04 NOTE — Progress Notes (Signed)
[facility] for Warfarin (hold) >>heparin when INR <2 Indication: atrial fibrillation  Labs:  Recent Labs  03/01/17 0357 03/02/17 0328 03/02/17 0801 03/03/17 0249 03/03/17 1128 03/03/17 1634 03/04/17 0221  HGB 12.4*  --  12.2* 12.0*  --   --   --   HCT 36.8*  --  36.2* 34.6*  --   --   --   PLT 95*  --  96* 103*  --   --   --   LABPROT 27.3* 24.5*  --  21.2*  --   --  20.9*  INR 2.48 2.17  --  1.81  --   --  1.78  HEPARINUNFRC  --   --   --   --   --  0.14* 0.36  CREATININE 1.20  --  1.39* 1.41* 1.48*  --  1.44*    Estimated Creatinine Clearance: 56.7 mL/min (A) (by C-G formula based on SCr of 1.44 mg/dL (H)).  Assessment: 76 y.o. male with medical history significant of gait abnormality, cataract, stage III chronic kidney disease, type 2 diabetes, essential tremor, gout, hyperlipidemia, hypertension, permanent atrial fibrillation on warfarin who was brought to the emergency department via EMS after he was found unresponsive by his wife.   MD planning L/RHC when INR down < 1.6 plan. INR 1.78 today; on heparin bridge therapy. Heparin level therapeutic (0.36) on gtt at 1300 units/hr. No issues with line or bleeding reported per RN.  Goal of Therapy:  INR 2-3 HL 0.3-0.7 units/ml  Plan:  Continue heparin at 1300 units/hr Will f/u 6hr confirmatory heparin level  Sherlon Handing, PharmD, BCPS Clinical pharmacist, pager [phone] 03/04/2017 3:11 AM

## 2017-03-04 NOTE — Progress Notes (Signed)
Back from the cath.lab awake and  Alert. TRB to right wrist intact, elevated with pillow. Pulse ox to right thumb. Instructed not to move right arm to prevent bleeding.continue to monitor.

## 2017-03-04 NOTE — Progress Notes (Addendum)
ANTICOAGULATION CONSULT NOTE  Pharmacy Consult for Warfarin (hold) >>heparin while INR <2 Indication: atrial fibrillation  Labs:  Recent Labs  03/02/17 0328  03/02/17 0801 03/03/17 0249 03/03/17 1128 03/03/17 1634 03/04/17 0221  HGB  --   < > 12.2* 12.0*  --   --  11.8*  HCT  --   --  36.2* 34.6*  --   --  33.9*  PLT  --   --  96* 103*  --   --  112*  LABPROT 24.5*  --   --  21.2*  --   --  20.9*  INR 2.17  --   --  1.81  --   --  1.78  HEPARINUNFRC  --   --   --   --   --  0.14* 0.36  CREATININE  --   < > 1.39* 1.41* 1.48*  --  1.44*  < > = values in this interval not displayed.  Estimated Creatinine Clearance: 57.5 mL/min (A) (by C-G formula based on SCr of 1.44 mg/dL (H)).  Assessment: 76 y.o. male with medical history significant of gait abnormality, cataract, stage III chronic kidney disease, type 2 diabetes, essential tremor, gout, hyperlipidemia, hypertension, permanent atrial fibrillation on warfarin who was brought to the emergency department via EMS after he was found unresponsive by his wife.   MD planning L/RHC when INR down < 1.8 plan for 3/27. INR 1.78 today.  Heparin started 3/26. Heparin drip 1300 uts/hr  HL 0.36 at goal last pm but this am < 0.1 IV lines were changed and drip infusing appropriately.  Plan cath this afternoon.   No bleeding issues noted. Last dose of warfarin was 3/21.  Goal of Therapy:  INR 2-3 HL 0.3-0.7  Plan:  Increase heparin drip 1400 uts/hr HL /cbc  daily Monitor for signs and symptoms of bleeding.  f/u after cath   Bonnita Nasuti Pharm.D. CPP, BCPS Clinical Pharmacist [phone] 03/04/2017 8:55 AM    PM Addendum Post cath restart heparin 8hr after sheath out - out 1430 restart heaprin drip rate 1400 uts/hr at 2300 and f/u am HL Restart warfarin tonight 5mg  x1 daily Protime  Bonnita Nasuti Pharm.D. CPP, BCPS Clinical Pharmacist [phone] 03/04/2017 3:35 PM

## 2017-03-04 NOTE — H&P (View-Only) (Signed)
Advanced Heart Failure Rounding Note   Subjective:    IV amio started 3/24 for AF with RVR with rates up to 160.   Remains on amio. HR in 80s. Still SOB with ambulating room. Weight reported up 7 pounds (?). Denies orthopnea.   INR 1.78. For cath today    Objective:   Weight Range:  Vital Signs:   Temp:  [97.5 F (36.4 C)-98.5 F (36.9 C)] 97.7 F (36.5 C) (03/27 0809) Pulse Rate:  [33-96] 53 (03/27 0809) Resp:  [16-28] 22 (03/27 0809) BP: (93-111)/(49-71) 98/61 (03/27 0809) SpO2:  [39 %-99 %] 96 % (03/27 0809) Weight:  [102.7 kg (226 lb 6.4 oz)] 102.7 kg (226 lb 6.4 oz) (03/27 0637) Last BM Date: 03/01/17  Weight change: Filed Weights   02/26/17 2209 02/27/17 0800 03/04/17 0637  Weight: 100.2 kg (220 lb 14.4 oz) 99.7 kg (219 lb 12.8 oz) 102.7 kg (226 lb 6.4 oz)    Intake/Output:   Intake/Output Summary (Last 24 hours) at 03/04/17 0854 Last data filed at 03/04/17 0700  Gross per 24 hour  Intake            844.4 ml  Output             1625 ml  Net           -780.6 ml     Physical Exam: General: Lying flat in bed. NAD wife at bedside HEENT: normal Neck: supple. JVP ~9 + prominent CV waves . Carotids 2+ bilat; no bruits. No lymphadenopathy or thryomegaly appreciated. Cor: PMI nondisplaced. IRR IRR rate.  2/6 TR + RV lift Lungs: clear anteriorly  No wheezing Abdomen: soft, nontender, nondistended No hepatosplenomegaly. No bruits or masses. Good bowel sounds. Extremities: no cyanosis, clubbing, rash, mild edema Neuro: alert & orientedx3, cranial nerves grossly intact. moves all 4 extremities w/o difficulty. Affect pleasant. noasterixis  Telemetry: AF 80-100  +PVCs Personally reviewed   Labs: Basic Metabolic Panel:  Recent Labs Lab 03/01/17 0357 03/02/17 0801 03/03/17 0249 03/03/17 1128 03/04/17 0221  NA 129* 129* 128* 131* 131*  K 4.8 4.7 5.7* 4.5 4.2  CL 100* 96* 95* 95* 96*  CO2 21* 24 21* 26 25  GLUCOSE 112* 108* 91 124* 101*  BUN 29* 32*  38* 40* 42*  CREATININE 1.20 1.39* 1.41* 1.48* 1.44*  CALCIUM 8.4* 8.4* 8.3* 8.5* 8.4*    Liver Function Tests:  Recent Labs Lab 02/25/17 1401 02/26/17 0446 02/27/17 0550 02/28/17 1110  AST 32 29 31 28   ALT 25 24 24 26   ALKPHOS 216* 183* 186* 208*  BILITOT 3.1* 3.4* 3.2* 5.0*  PROT 8.7* 7.6 7.8 7.8  ALBUMIN 3.8 3.3* 3.3* 2.8*   No results for input(s): LIPASE, AMYLASE in the last 168 hours.  Recent Labs Lab 03/01/17 1021 03/02/17 0801 03/04/17 0221  AMMONIA 25 24 35    CBC:  Recent Labs Lab 02/26/17 0446  02/28/17 1110 03/01/17 0357 03/02/17 0801 03/03/17 0249 03/04/17 0221  WBC 10.0  < > 12.1* 11.5* 9.3 8.2 7.5  NEUTROABS 7.8*  --   --   --   --   --   --   HGB 12.3*  < > 12.9* 12.4* 12.2* 12.0* 11.8*  HCT 34.7*  < > 37.8* 36.8* 36.2* 34.6* 33.9*  MCV 96.7  < > 100.5* 100.5* 100.3* 99.1 98.5  PLT 111*  < > 96* 95* 96* 103* 112*  < > = values in this interval not displayed.  Cardiac Enzymes:  Recent Labs Lab 02/25/17 2350 02/26/17 0446 02/27/17 0550 02/27/17 1226 02/27/17 1824  TROPONINI 0.03* 0.04* 0.03* 0.03* 0.03*    BNP: BNP (last 3 results) No results for input(s): BNP in the last 8760 hours.  ProBNP (last 3 results) No results for input(s): PROBNP in the last 8760 hours.    Other results:  Imaging: No results found.   Medications:     Scheduled Medications: . calcitRIOL  0.5 mcg Oral Daily  . febuxostat  40 mg Oral Daily  . lactulose  20 g Oral Daily  . metoprolol tartrate  25 mg Oral BID  . pantoprazole  40 mg Oral BID AC  . sodium chloride flush  3 mL Intravenous Q12H  . torsemide  40 mg Oral BID    Infusions: . sodium chloride 10 mL/hr at 03/04/17 0000  . amiodarone 30 mg/hr (03/04/17 0823)  . heparin 1,300 Units/hr (03/04/17 0700)    PRN Medications: sodium chloride, ipratropium, levalbuterol, ondansetron **OR** ondansetron (ZOFRAN) IV, promethazine, sodium chloride flush   Assessment:   1. Acute on chronic  diastolic CHF 2. Hypoglycemia 3. Unstable angina 4. Chronic Atrial fibrillation now with RVR 5. Hyponatremia/hyperkalemia 6. Cirrhosis of the liver with ascites 7. Type 2 diabetes mellitus  8. Acute on CKD stage III 9. 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 If cirrhosis is primary issue porto-pulmonary PAH is a possibility but he has no real RFs for primary cirrhosis. CTEPH alo unlikely given longstanding anticoagulation for AF.   AF rate improved on amio. Remains on heparin. No bleeding. INR 1.78. For cath today to evaluate coronaries and PA pressures/RV function. AC discussed with PharmD  Volume status improved. K down. Can resume spiro after cath. Renal function stable.   Continue lactulose for hepatic encephalopathy.    Length of Stay: 7  Glori Bickers MD 03/04/2017, 8:54 AM  Advanced Heart Failure Team Pager [phone] (M-F; [place])  Please contact [facility] for night-coverage after hours (4p -7a ) and weekends on [email or web address]

## 2017-03-04 NOTE — Progress Notes (Signed)
Advanced Heart Failure Rounding Note   Subjective:    IV amio started 3/24 for AF with RVR with rates up to 160.   Remains on amio. HR in 80s. Still SOB with ambulating room. Weight reported up 7 pounds (?). Denies orthopnea.   INR 1.78. For cath today    Objective:   Weight Range:  Vital Signs:   Temp:  [97.5 F (36.4 C)-98.5 F (36.9 C)] 97.7 F (36.5 C) (03/27 0809) Pulse Rate:  [33-96] 53 (03/27 0809) Resp:  [16-28] 22 (03/27 0809) BP: (93-111)/(49-71) 98/61 (03/27 0809) SpO2:  [39 %-99 %] 96 % (03/27 0809) Weight:  [102.7 kg (226 lb 6.4 oz)] 102.7 kg (226 lb 6.4 oz) (03/27 0637) Last BM Date: 03/01/17  Weight change: Filed Weights   02/26/17 2209 02/27/17 0800 03/04/17 0637  Weight: 100.2 kg (220 lb 14.4 oz) 99.7 kg (219 lb 12.8 oz) 102.7 kg (226 lb 6.4 oz)    Intake/Output:   Intake/Output Summary (Last 24 hours) at 03/04/17 0854 Last data filed at 03/04/17 0700  Gross per 24 hour  Intake            844.4 ml  Output             1625 ml  Net           -780.6 ml     Physical Exam: General: Lying flat in bed. NAD wife at bedside HEENT: normal Neck: supple. JVP ~9 + prominent CV waves . Carotids 2+ bilat; no bruits. No lymphadenopathy or thryomegaly appreciated. Cor: PMI nondisplaced. IRR IRR rate.  2/6 TR + RV lift Lungs: clear anteriorly  No wheezing Abdomen: soft, nontender, nondistended No hepatosplenomegaly. No bruits or masses. Good bowel sounds. Extremities: no cyanosis, clubbing, rash, mild edema Neuro: alert & orientedx3, cranial nerves grossly intact. moves all 4 extremities w/o difficulty. Affect pleasant. noasterixis  Telemetry: AF 80-100  +PVCs Personally reviewed   Labs: Basic Metabolic Panel:  Recent Labs Lab 03/01/17 0357 03/02/17 0801 03/03/17 0249 03/03/17 1128 03/04/17 0221  NA 129* 129* 128* 131* 131*  K 4.8 4.7 5.7* 4.5 4.2  CL 100* 96* 95* 95* 96*  CO2 21* 24 21* 26 25  GLUCOSE 112* 108* 91 124* 101*  BUN 29* 32*  38* 40* 42*  CREATININE 1.20 1.39* 1.41* 1.48* 1.44*  CALCIUM 8.4* 8.4* 8.3* 8.5* 8.4*    Liver Function Tests:  Recent Labs Lab 02/25/17 1401 02/26/17 0446 02/27/17 0550 02/28/17 1110  AST 32 29 31 28   ALT 25 24 24 26   ALKPHOS 216* 183* 186* 208*  BILITOT 3.1* 3.4* 3.2* 5.0*  PROT 8.7* 7.6 7.8 7.8  ALBUMIN 3.8 3.3* 3.3* 2.8*   No results for input(s): LIPASE, AMYLASE in the last 168 hours.  Recent Labs Lab 03/01/17 1021 03/02/17 0801 03/04/17 0221  AMMONIA 25 24 35    CBC:  Recent Labs Lab 02/26/17 0446  02/28/17 1110 03/01/17 0357 03/02/17 0801 03/03/17 0249 03/04/17 0221  WBC 10.0  < > 12.1* 11.5* 9.3 8.2 7.5  NEUTROABS 7.8*  --   --   --   --   --   --   HGB 12.3*  < > 12.9* 12.4* 12.2* 12.0* 11.8*  HCT 34.7*  < > 37.8* 36.8* 36.2* 34.6* 33.9*  MCV 96.7  < > 100.5* 100.5* 100.3* 99.1 98.5  PLT 111*  < > 96* 95* 96* 103* 112*  < > = values in this interval not displayed.  Cardiac Enzymes:  Recent Labs Lab 02/25/17 2350 02/26/17 0446 02/27/17 0550 02/27/17 1226 02/27/17 1824  TROPONINI 0.03* 0.04* 0.03* 0.03* 0.03*    BNP: BNP (last 3 results) No results for input(s): BNP in the last 8760 hours.  ProBNP (last 3 results) No results for input(s): PROBNP in the last 8760 hours.    Other results:  Imaging: No results found.   Medications:     Scheduled Medications: . calcitRIOL  0.5 mcg Oral Daily  . febuxostat  40 mg Oral Daily  . lactulose  20 g Oral Daily  . metoprolol tartrate  25 mg Oral BID  . pantoprazole  40 mg Oral BID AC  . sodium chloride flush  3 mL Intravenous Q12H  . torsemide  40 mg Oral BID    Infusions: . sodium chloride 10 mL/hr at 03/04/17 0000  . amiodarone 30 mg/hr (03/04/17 0823)  . heparin 1,300 Units/hr (03/04/17 0700)    PRN Medications: sodium chloride, ipratropium, levalbuterol, ondansetron **OR** ondansetron (ZOFRAN) IV, promethazine, sodium chloride flush   Assessment:   1. Acute on chronic  diastolic CHF 2. Hypoglycemia 3. Unstable angina 4. Chronic Atrial fibrillation now with RVR 5. Hyponatremia/hyperkalemia 6. Cirrhosis of the liver with ascites 7. Type 2 diabetes mellitus  8. Acute on CKD stage III 9. 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 If cirrhosis is primary issue porto-pulmonary PAH is a possibility but he has no real RFs for primary cirrhosis. CTEPH alo unlikely given longstanding anticoagulation for AF.   AF rate improved on amio. Remains on heparin. No bleeding. INR 1.78. For cath today to evaluate coronaries and PA pressures/RV function. AC discussed with PharmD  Volume status improved. K down. Can resume spiro after cath. Renal function stable.   Continue lactulose for hepatic encephalopathy.    Length of Stay: 7  Glori Bickers MD 03/04/2017, 8:54 AM  Advanced Heart Failure Team Pager [phone] (M-F; [place])  Please contact [facility] for night-coverage after hours (4p -7a ) and weekends on [email or web address]

## 2017-03-04 NOTE — Progress Notes (Signed)
TR band removed, gauze 2x2 and tegaderm applied, no bleeding noted, continue to monitor.

## 2017-03-04 NOTE — Progress Notes (Signed)
To the cath. Lab by bed, stable.

## 2017-03-04 NOTE — Progress Notes (Signed)
PROGRESS NOTE    Melvin Wang  QQP:[id number] DOB: 1941-07-04 DOA: 02/25/2017 PCP: Claretta Fraise, MD   Chief Complaint  Patient presents with  . Hypoglycemia    Brief Narrative:  76 year old man admitted to the hospital on 3/20 from home after found unresponsive, upon EMS arrival his CBG was 23. Sugars have responded to dextrose administration. Overnight events of 3/21-3/22 noted. Patient developed substernal chest pain with lateral changes on EKG. Has been seen by cardiology and is currently awaiting cardiac catheterization. Assessment & Plan   Acute metabolic encephalopathy -Appears to have resolved -Due to hypoglycemia.  Hypoglycemia -Upon EMS arrival, blood glucose 23 -Patient has lost 25lbs  recently  -placed on D5- discontinued, blood sugars stable -hemoglobin A1C 5.4 on 01/19/2017  Substernal chest pain/unstable angina -With nonspecific ST changes laterally on EKG. -Cardiology consulted and appreciated, transferred to [facility] for potential cardiac catheterization -Troponins trended, and flat (0.03-0.04) -Plan for right and left heart cath when INR <1.7?  Right heart failure -Cardiology consulted and appreciated -Plan for RHC/LHC on Monday 03/03/2017 -was on IV lasix, and transitioned to spironolactone and torsemide -Discontinued spironolactone today given hyperkalemia  Aspiration -patient aspirated on metoprolol pill yesterday evening -CXR unremarkable -speech therapy consulted overnight and has placed patient on heart healthy diet, regular thin liquids  Thrombocytopenia -Platelets currently 112 (increasing slightly) -Currently on no anticoagulation -possible related to cirrhosis -Continue to monitor CBC  Hypotension -Patient was on IV lopressors, which has been discontinued -Cardiology started PO metoprolol for rate control of Afib -BP stable  Hyponatremia -Due to cirrhosis, worse than baseline, suspect in part due to some mild dehydration. -Was  given IVF and improved mildly -Currently 131, continue to monitor BMP  Permanent atrial fibrillation -Rate controlled, warfarin remains on hold due to potential of cardiac catheterization. -Currently amiodarone drip and metoprolol 25mg  BID -INR 1.78 today -Continue heparin drip  Cirrhosis of the liver -Lactulose dose reduced to daily -ammonia level 35 -Followed by Dr. Barney Drain   Chronic kidney disease, stage III -Creatinine 1.44 today -Continue to monitor BMP  Hyperkalemia -Potassium elevated on 3/26, discontinued spironolactone and given Kayexalate -resolved -may consider starting low dose spironolactone today (pending cardio rec)   Physical deconditioning -Likely secondary to comorbidities  -Will consult PT/OT after cath if possible -Patient may need SNF  DVT Prophylaxis  heparin  Code Status: Full  Family Communication: Wife at bedside  Disposition Plan: Admitted. Pending heart cath.  Consultants Cardiology  Procedures  None  Antibiotics   Anti-infectives    None      Subjective:   Melvin Wang seen and examined today.  Denies current shortness of breath and chest pain today.  Continues to feel weak.  Wonders if he will have heart cath today. Denies abdominal pain, N/V/C.  Objective:   Vitals:   03/04/17 0400 03/04/17 0600 03/04/17 0637 03/04/17 0809  BP: (!) 97/58 98/63  98/61  Pulse: (!) 51 (!) 44  (!) 53  Resp: (!) 28 18  (!) 22  Temp:    97.7 F (36.5 C)  TempSrc:    Oral  SpO2: 96% 97%  96%  Weight:   102.7 kg (226 lb 6.4 oz)   Height:        Intake/Output Summary (Last 24 hours) at 03/04/17 0828 Last data filed at 03/04/17 0700  Gross per 24 hour  Intake            844.4 ml  Output  1625 ml  Net           -780.6 ml   Filed Weights   02/26/17 2209 02/27/17 0800 03/04/17 0637  Weight: 100.2 kg (220 lb 14.4 oz) 99.7 kg (219 lb 12.8 oz) 102.7 kg (226 lb 6.4 oz)    Exam  General: Well developed, well nourished,  NAD  HEENT: NCAT, mucous membranes moist. Poor dentition   Neck: Supple, + JVD, no masses  Cardiovascular: S1 S2 auscultated, irregularly irregular, 2/6 murmur  Respiratory: Diminished but clear breath sounds  Abdomen: Soft, nontender, nondistended, + bowel sounds  Extremities: warm dry without cyanosis clubbing, trace LE edema  Skin: lower extremity chronic skin changes  Neuro: AAOx3, nonfocal  Psych: Appropriate mood and affect, pleasant   Data Reviewed: I have personally reviewed following labs and imaging studies  CBC:  Recent Labs Lab 02/26/17 0446  02/28/17 1110 03/01/17 0357 03/02/17 0801 03/03/17 0249 03/04/17 0221  WBC 10.0  < > 12.1* 11.5* 9.3 8.2 7.5  NEUTROABS 7.8*  --   --   --   --   --   --   HGB 12.3*  < > 12.9* 12.4* 12.2* 12.0* 11.8*  HCT 34.7*  < > 37.8* 36.8* 36.2* 34.6* 33.9*  MCV 96.7  < > 100.5* 100.5* 100.3* 99.1 98.5  PLT 111*  < > 96* 95* 96* 103* 112*  < > = values in this interval not displayed. Basic Metabolic Panel:  Recent Labs Lab 03/01/17 0357 03/02/17 0801 03/03/17 0249 03/03/17 1128 03/04/17 0221  NA 129* 129* 128* 131* 131*  K 4.8 4.7 5.7* 4.5 4.2  CL 100* 96* 95* 95* 96*  CO2 21* 24 21* 26 25  GLUCOSE 112* 108* 91 124* 101*  BUN 29* 32* 38* 40* 42*  CREATININE 1.20 1.39* 1.41* 1.48* 1.44*  CALCIUM 8.4* 8.4* 8.3* 8.5* 8.4*   GFR: Estimated Creatinine Clearance: 57.5 mL/min (A) (by C-G formula based on SCr of 1.44 mg/dL (H)). Liver Function Tests:  Recent Labs Lab 02/25/17 1401 02/26/17 0446 02/27/17 0550 02/28/17 1110  AST 32 29 31 28   ALT 25 24 24 26   ALKPHOS 216* 183* 186* 208*  BILITOT 3.1* 3.4* 3.2* 5.0*  PROT 8.7* 7.6 7.8 7.8  ALBUMIN 3.8 3.3* 3.3* 2.8*   No results for input(s): LIPASE, AMYLASE in the last 168 hours.  Recent Labs Lab 02/26/17 0446 02/28/17 1110 03/01/17 1021 03/02/17 0801 03/04/17 0221  AMMONIA 48* 36* 25 24 35   Coagulation Profile:  Recent Labs Lab 02/28/17 0439  03/01/17 0357 03/02/17 0328 03/03/17 0249 03/04/17 0221  INR 2.76 2.48 2.17 1.81 1.78   Cardiac Enzymes:  Recent Labs Lab 02/25/17 2350 02/26/17 0446 02/27/17 0550 02/27/17 1226 02/27/17 1824  TROPONINI 0.03* 0.04* 0.03* 0.03* 0.03*   BNP (last 3 results) No results for input(s): PROBNP in the last 8760 hours. HbA1C: No results for input(s): HGBA1C in the last 72 hours. CBG:  Recent Labs Lab 03/03/17 0843 03/03/17 1239 03/03/17 1642 03/03/17 2125 03/04/17 0756  GLUCAP 92 102* 118* 135* 87   Lipid Profile: No results for input(s): CHOL, HDL, LDLCALC, TRIG, CHOLHDL, LDLDIRECT in the last 72 hours. Thyroid Function Tests: No results for input(s): TSH, T4TOTAL, FREET4, T3FREE, THYROIDAB in the last 72 hours. Anemia Panel: No results for input(s): VITAMINB12, FOLATE, FERRITIN, TIBC, IRON, RETICCTPCT in the last 72 hours. Urine analysis:    Component Value Date/Time   COLORURINE YELLOW 02/25/2017 [place] 02/25/2017 1355   APPEARANCEUR Clear  09/28/2013 0845   LABSPEC 1.010 02/25/2017 1355   PHURINE 5.0 02/25/2017 1355   GLUCOSEU 50 (A) 02/25/2017 1355   HGBUR NEGATIVE 02/25/2017 1355   BILIRUBINUR NEGATIVE 02/25/2017 1355   BILIRUBINUR neg 12/07/2015 0939   BILIRUBINUR Negative 09/28/2013 0845   KETONESUR NEGATIVE 02/25/2017 1355   PROTEINUR NEGATIVE 02/25/2017 1355   UROBILINOGEN negative 12/07/2015 0939   UROBILINOGEN 0.2 02/19/2014 1612   NITRITE NEGATIVE 02/25/2017 1355   LEUKOCYTESUR NEGATIVE 02/25/2017 1355   LEUKOCYTESUR Negative 09/28/2013 0845   Sepsis Labs: @LABRCNTIP (procalcitonin:4,lacticidven:4)  ) Recent Results (from the past 240 hour(s))  MRSA PCR Screening     Status: Abnormal   Collection Time: 02/26/17  5:20 AM  Result Value Ref Range Status   MRSA by PCR INVALID RESULTS, SPECIMEN SENT FOR CULTURE (A) NEGATIVE Final    Comment: RESULT CALLED TO, READ BACK BY AND VERIFIED WITH: PHILLIPS C. AT 0758A ON [id number] BY  THOMPSON S.   MRSA culture     Status: None   Collection Time: 02/26/17  5:20 AM  Result Value Ref Range Status   Specimen Description NOSE  Final   Special Requests NONE  Final   Culture   Final    NO MRSA DETECTED Performed at [facility] [address], [place], [place] [address]    Report Status 02/28/2017 FINAL  Final  MRSA PCR Screening     Status: None   Collection Time: 02/28/17  6:57 AM  Result Value Ref Range Status   MRSA by PCR NEGATIVE NEGATIVE Final    Comment:        The GeneXpert MRSA Assay (FDA approved for NASAL specimens only), is one component of a comprehensive MRSA colonization surveillance program. It is not intended to diagnose MRSA infection nor to guide or monitor treatment for MRSA infections.       Radiology Studies: No results found.   Scheduled Meds: . calcitRIOL  0.5 mcg Oral Daily  . febuxostat  40 mg Oral Daily  . lactulose  20 g Oral Daily  . metoprolol tartrate  25 mg Oral BID  . pantoprazole  40 mg Oral BID AC  . sodium chloride flush  3 mL Intravenous Q12H  . torsemide  40 mg Oral BID   Continuous Infusions: . sodium chloride 10 mL/hr at 03/04/17 0000  . amiodarone 30 mg/hr (03/04/17 0823)  . heparin 1,300 Units/hr (03/04/17 0700)     LOS: 7 days   Time Spent in minutes   30 minutes  Jenniger Figiel D.O. on 03/04/2017 at 8:28 AM  Between 7am to 7pm - Pager - [phone]  After 7pm go to [email or web address] - password [email or web address]  And look for the night coverage person covering for me after hours  [facility] Office  [phone]

## 2017-03-04 NOTE — Interval H&P Note (Signed)
History and Physical Interval Note:  03/04/2017 1:35 PM  Melvin Wang  has presented today for surgery, with the diagnosis of hf  The various methods of treatment have been discussed with the patient and family. After consideration of risks, benefits and other options for treatment, the patient has consented to  Procedure(s): Right/Left Heart Cath and Coronary Angiography (N/A) and possible angioplasty as a surgical intervention .  The patient's history has been reviewed, patient examined, no change in status, stable for surgery.  I have reviewed the patient's chart and labs.  Questions were answered to the patient's satisfaction.     Bensimhon, Quillian Quince

## 2017-03-05 ENCOUNTER — Inpatient Hospital Stay (HOSPITAL_COMMUNITY): Payer: Medicare Other

## 2017-03-05 LAB — CBC
HCT: 33.3 % — ABNORMAL LOW (ref 39.0–52.0)
HEMOGLOBIN: 11.6 g/dL — AB (ref 13.0–17.0)
MCH: 34.2 pg — AB (ref 26.0–34.0)
MCHC: 34.8 g/dL (ref 30.0–36.0)
MCV: 98.2 fL (ref 78.0–100.0)
Platelets: 114 10*3/uL — ABNORMAL LOW (ref 150–400)
RBC: 3.39 MIL/uL — AB (ref 4.22–5.81)
RDW: 15.8 % — ABNORMAL HIGH (ref 11.5–15.5)
WBC: 7.1 10*3/uL (ref 4.0–10.5)

## 2017-03-05 LAB — BASIC METABOLIC PANEL
ANION GAP: 9 (ref 5–15)
BUN: 45 mg/dL — AB (ref 6–20)
CHLORIDE: 97 mmol/L — AB (ref 101–111)
CO2: 27 mmol/L (ref 22–32)
Calcium: 8.3 mg/dL — ABNORMAL LOW (ref 8.9–10.3)
Creatinine, Ser: 1.64 mg/dL — ABNORMAL HIGH (ref 0.61–1.24)
GFR calc Af Amer: 46 mL/min — ABNORMAL LOW (ref >60)
GFR, EST NON AFRICAN AMERICAN: 40 mL/min — AB (ref >60)
GLUCOSE: 126 mg/dL — AB (ref 65–99)
POTASSIUM: 4.2 mmol/L (ref 3.5–5.1)
Sodium: 133 mmol/L — ABNORMAL LOW (ref 135–145)

## 2017-03-05 LAB — HEPARIN LEVEL (UNFRACTIONATED)
HEPARIN UNFRACTIONATED: 0.51 [IU]/mL (ref 0.30–0.70)
Heparin Unfractionated: 0.29 IU/mL — ABNORMAL LOW (ref 0.30–0.70)

## 2017-03-05 LAB — GLUCOSE, CAPILLARY
GLUCOSE-CAPILLARY: 100 mg/dL — AB (ref 65–99)
GLUCOSE-CAPILLARY: 108 mg/dL — AB (ref 65–99)
Glucose-Capillary: 110 mg/dL — ABNORMAL HIGH (ref 65–99)

## 2017-03-05 LAB — PROTIME-INR
INR: 1.7
PROTHROMBIN TIME: 20.2 s — AB (ref 11.4–15.2)

## 2017-03-05 MED ORDER — TECHNETIUM TO 99M ALBUMIN AGGREGATED
4.0000 | Freq: Once | INTRAVENOUS | Status: AC | PRN
  Administered 2017-03-05: 4 via INTRAVENOUS

## 2017-03-05 MED ORDER — TECHNETIUM TC 99M DIETHYLENETRIAME-PENTAACETIC ACID: 30.0000 | Freq: Once | INTRAVENOUS | Status: DC | PRN

## 2017-03-05 MED ORDER — WARFARIN SODIUM 5 MG PO TABS
5.0000 mg | ORAL_TABLET | Freq: Once | ORAL | Status: AC
  Administered 2017-03-05: 5 mg via ORAL
  Filled 2017-03-05: qty 1

## 2017-03-05 NOTE — Progress Notes (Signed)
Advanced Heart Failure Rounding Note   Subjective:    IV amio started 3/24 for AF with RVR with rates up to 160.   Remains on amio gtt. Remains in Afib but rates in 80s.   Feeling great this am. Denies SOB or CP. Denies tachypalpitations. No lightheadedness or dizziness.   Out 1.1 L. No weight yet today.  Creatinine 1.4 -> 1.6 s/p cath. On po torsemide.   Cath yesterday with no CAD, relatively preserved cardiac output, and end stage RV failure. Full results as below.   Feels good today. Cath results reviewed with him and his family. Denies dyspnea. Renal function stable.   INR 1.70. 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  Objective:   Weight Range:  Vital Signs:   Temp:  [97.4 F (36.3 C)-99 F (37.2 C)] 97.4 F (36.3 C) (03/28 0828) Pulse Rate:  [62-135] 62 (03/28 0828) Resp:  [14-28] 28 (03/28 0828) BP: (86-118)/(55-84) 99/58 (03/28 0828) SpO2:  [89 %-98 %] 94 % (03/28 0828) Last BM Date: 03/01/17  Weight change: Filed Weights   02/26/17 2209 02/27/17 0800 03/04/17 0637  Weight: 220 lb 14.4 oz (100.2 kg) 219 lb 12.8 oz (99.7 kg) 226 lb 6.4 oz (102.7 kg)    Intake/Output:   Intake/Output Summary (Last 24 hours) at 03/05/17 0919 Last data filed at 03/05/17 0630  Gross per 24 hour  Intake           505.43 ml  Output             1830 ml  Net         -1324.57 ml     Physical Exam: General: Sitting in recliner. Elderly. NAD. Family present  HEENT: Normal anicteric  Neck: Supple. JVP to ear  cm+ with prominent CV waves. Carotids 2+ bilat; no bruits. No thyromegaly or nodule noted.   Cor: PMI nondisplaced. IRR, IRR 2/6 TR + RV lift  Lungs: Mildly diminished  basilar sounds no wheeze .  Abdomen: soft, NT, nondistended, no HSM. No bruits or masses. +BS  Extremities: no cyanosis, clubbing, rash. Trace edema. RUE cath sties ok . Chronic venous stasis changes to bilateral LEs.  Neuro: Alert & oriented x 3. Cranial nerves grossly intact. Moves all 4 extremities w/o difficulty. Affect pleasant    Telemetry: AF 80-90s Personally reviewed   Labs: Basic Metabolic Panel:  Recent Labs Lab 03/02/17 0801 03/03/17 0249 03/03/17 1128 03/04/17 0221 03/05/17 0217  NA 129* 128* 131* 131* 133*  K 4.7 5.7* 4.5 4.2 4.2  CL 96* 95* 95* 96* 97*  CO2 24 21* 26 25 27   GLUCOSE 108* 91 124* 101* 126*  BUN 32* 38* 40* 42* 45*  CREATININE 1.39* 1.41* 1.48* 1.44* 1.64*  CALCIUM 8.4* 8.3* 8.5* 8.4* 8.3*    Liver Function Tests:  Recent Labs Lab 02/27/17 0550 02/28/17 1110  AST 31 28  ALT 24 26  ALKPHOS 186* 208*  BILITOT 3.2* 5.0*  PROT 7.8 7.8  ALBUMIN 3.3* 2.8*   No results for input(s): LIPASE, AMYLASE in the last 168 hours.  Recent Labs Lab 03/01/17 1021 03/02/17 0801 03/04/17 0221  AMMONIA 25 24 35    CBC:  Recent Labs Lab 03/01/17 0357 03/02/17 0801 03/03/17 0249 03/04/17 0221 03/05/17 0217  WBC 11.5* 9.3 8.2 7.5 7.1  HGB 12.4* 12.2* 12.0* 11.8* 11.6*  HCT 36.8* 36.2* 34.6* 33.9* 33.3*  MCV 100.5* 100.3* 99.1 98.5 98.2  PLT 95* 96* 103* 112* 114*    Cardiac Enzymes:  Recent Labs Lab 02/27/17 0550 02/27/17 1226 02/27/17 1824  TROPONINI 0.03* 0.03* 0.03*    BNP: BNP (last 3 results) No results for input(s): BNP in the last 8760 hours.  ProBNP (last 3 results) No results for input(s): PROBNP in the last 8760 hours.    Other results:  Imaging: No results found.   Medications:     Scheduled Medications: . calcitRIOL  0.5 mcg Oral Daily  . febuxostat  40 mg Oral Daily  . lactulose  20 g Oral Daily  . metoprolol tartrate  25 mg Oral BID  . pantoprazole  40 mg Oral BID AC  . sodium chloride flush  3 mL  Intravenous Q12H  . torsemide  40 mg Oral BID  . Warfarin - Pharmacist Dosing Inpatient   Does not apply q1800    Infusions: . amiodarone 30 mg/hr (03/04/17 2019)  . heparin 1,400 Units/hr (03/04/17 2238)    PRN Medications: sodium chloride, acetaminophen, ipratropium, levalbuterol, ondansetron **OR** ondansetron (ZOFRAN) IV, ondansetron (ZOFRAN) IV, promethazine, sodium chloride flush   Assessment:   1. Acute on chronic diastolic CHF 2. Hypoglycemia 3. Unstable angina 4. Chronic Atrial fibrillation now with RVR 5. Hyponatremia/hyperkalemia 6. Cirrhosis of the liver with ascites 7. Type 2 diabetes mellitus  8. Acute on CKD stage III 9. Hyperkalemia  Plan/Discussion:    AF rate improved on amio. Consider transition to po in next day or so. Has had around 2.5 g so far. Remains on heparin with bridging coumadin. INR 1.7 this am.   Cath yesterday with normal coronaries and end stage RV failure. Cardiac output OK.   Volume status improved. K stable back on spiro. Continue po torsemide.   Renal function mildly elevated after cath yesterday. Continue to follow.    Continue lactulose for hepatic encephalopathy.   Will order VQ scan to r/o chronic PE with end stage RV failure, though less likely with long standing anticoag with Afib.   Will need sleep study as outpatient.   Length of Stay: [address]  Annamaria Helling  03/05/2017, 9:19 AM  Advanced Heart Failure Team Pager [phone] (M-F; 7a - 4p)  Please contact [facility] for night-coverage after hours (4p -7a ) and weekends on [email or web address]  Patient seen and examined with Oda Kilts, PA-C. We discussed all aspects of the encounter. I agree with the assessment and plan as stated above.   Cath results reviewed with him and his family. 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 Fortunately CO not too bad. Will continue current regimen. Does not qualify for pulmonary vasodilators due to lack of PAH. Will  get VQ scan today. Outpatient sleep study.  Reload warfarin. Discussed with PharmD.   Can change amio to po for ongoing AF rate control.   Glori Bickers, MD  3:40 PM

## 2017-03-05 NOTE — Progress Notes (Signed)
PROGRESS NOTE  Melvin Wang  BHA:[id number] DOB: [birth year] DOA: 02/25/2017   PCP: Claretta Fraise, MD   Chief Complaint  Patient presents with  . Hypoglycemia    Brief Narrative:  76 year old man admitted to the hospital on 3/20 from home after found unresponsive, upon EMS arrival his CBG was 23. Sugars have responded to dextrose administration. Overnight events of 3/21-3/22 noted. Patient developed substernal chest pain with lateral changes on EKG. Has been seen by cardiology and is currently awaiting cardiac catheterization.  Assessment & Plan   Acute metabolic encephalopathy due to hypoglycemia  - resolved at this point, pt is calm and pleasant, able to continue conversation and participate in exam   Hypoglycemia - Upon EMS arrival, blood glucose 23 - Patient has lost 25lbs  recently  - hemoglobin A1C 5.4 on 01/19/2017 - CBG remain in low 100's   Substernal chest pain/unstable angina - With nonspecific ST changes laterally on EKG. - Cardiology consulted and appreciated, - Troponins trended, and flat (0.03-0.04)  Right heart failure - Cardiology consulted and appreciated - Discontinued spironolactone given hyperkalemia - Cath 3/27 with normal coronaries and end stage RV failure. Cardiac output OK.  - per cardio, consider transition Amio to po in next day or so - pt remains on heparin with bridging coumadin. INR 1.7 this am.  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 - Currently amiodarone drip and metoprolol 25mg  BID - INR 1.70 today - Continue hearin drip  Cirrhosis of the liver - continue Lactulose  - Followed by Dr. Barney Drain   Chronic kidney disease, stage III - Creatinine 1.4 --> 1.6 this AM - likely up post cath  - BMP In AM  Hyperkalemia - Potassium elevated on 3/26, discontinued spironolactone and given Kayexalate - now WNL this AM - BMP In AM  Physical deconditioning - Likely secondary to comorbidities  - PT consulted for assistance   DVT Prophylaxis  heparin  Code Status: Full  Family Communication: Wife at bedside  Disposition Plan: to be determined, awaiting PT eval, also once cardio team clears   Consultants  Cardiology  Procedures   None  Antibiotics    None  Subjective:   No concerns this AM, pt denies chest pain or dyspnea.   Objective:   Vitals:   03/04/17 2212 03/04/17 2300 03/05/17 0300 03/05/17 0828  BP:  94/61 (!) 86/57 (!) 99/58  Pulse: 94 84 93 62  Resp:  (!) 23 (!) 25 (!) 28  Temp:  98.2 F (36.8 C) 97.7 F (36.5 C) 97.4 F (36.3 C)  TempSrc:  Oral Oral Oral  SpO2:  92% 92% 94%  Weight:      Height:        Intake/Output Summary (Last 24 hours) at 03/05/17 1316 Last data filed at 03/05/17 1000  Gross per 24 hour  Intake  1009.63 ml  Output             1630 ml  Net          -620.37 ml   Filed Weights   02/26/17 2209 02/27/17 0800 03/04/17 0637  Weight: 100.2 kg (220 lb 14.4 oz) 99.7 kg (219 lb 12.8 oz) 102.7 kg (226 lb 6.4 oz)    Exam  General: Well developed, well nourished, NAD  HEENT: NCAT, mucous membranes moist. Poor dentition   Neck: Supple, + JVD, no masses  Cardiovascular: S1 S2 auscultated, irregularly irregular, 2/6 murmur  Respiratory: Diminished but clear breath sounds  Abdomen: Soft,  nontender, nondistended, + bowel sounds  Extremities: warm dry without cyanosis clubbing, trace LE edema  Data Reviewed: I have personally reviewed following labs and imaging studies  CBC:  Recent Labs Lab 03/01/17 0357 03/02/17 0801 03/03/17 0249 03/04/17 0221 03/05/17 0217  WBC 11.5* 9.3 8.2 7.5 7.1  HGB 12.4* 12.2* 12.0* 11.8* 11.6*  HCT 36.8* 36.2* 34.6* 33.9* 33.3*  MCV 100.5* 100.3* 99.1 98.5 98.2  PLT 95* 96* 103* 112* 176*   Basic Metabolic Panel:  Recent Labs Lab 03/02/17 0801 03/03/17 0249 03/03/17 1128 03/04/17 0221 03/05/17 0217  NA 129* 128* 131* 131* 133*  K 4.7 5.7* 4.5 4.2 4.2  CL 96* 95* 95* 96* 97*  CO2 24 21* 26 25 27   GLUCOSE 108* 91 124* 101* 126*  BUN 32* 38* 40* 42* 45*  CREATININE 1.39* 1.41* 1.48* 1.44* 1.64*  CALCIUM 8.4* 8.3* 8.5* 8.4* 8.3*   Liver Function Tests:  Recent Labs Lab 02/27/17 0550 02/28/17 1110  AST 31 28  ALT 24 26  ALKPHOS 186* 208*  BILITOT 3.2* 5.0*  PROT 7.8 7.8  ALBUMIN 3.3* 2.8*    Recent Labs Lab 02/28/17 1110 03/01/17 1021 03/02/17 0801 03/04/17 0221  AMMONIA 36* 25 24 35   Coagulation Profile:  Recent Labs Lab 03/01/17 0357 03/02/17 0328 03/03/17 0249 03/04/17 0221 03/05/17 0217  INR 2.48 2.17 1.81 1.78 1.70   Cardiac Enzymes:  Recent Labs Lab 02/27/17 0550 02/27/17 1226 02/27/17 1824  TROPONINI 0.03* 0.03* 0.03*   CBG:  Recent Labs Lab 03/04/17 0756 03/04/17 1203 03/04/17 1653 03/04/17 2002 03/05/17 0827  GLUCAP 87 95 99 107* 100*   Recent Results (from the past 240 hour(s))  MRSA PCR Screening     Status: Abnormal   Collection Time: 02/26/17  5:20 AM  Result Value Ref Range Status   MRSA by PCR INVALID RESULTS, SPECIMEN SENT FOR CULTURE (A) NEGATIVE Final    Comment: RESULT CALLED TO, READ BACK BY AND VERIFIED WITH: PHILLIPS C. AT 0758A ON [id number] BY THOMPSON S.   MRSA culture     Status: None   Collection Time: 02/26/17  5:20 AM  Result Value Ref Range Status    Specimen Description NOSE  Final   Special Requests NONE  Final   Culture   Final    NO MRSA DETECTED Performed at [facility], [address], [place], [place] [address]    Report Status 02/28/2017 FINAL  Final  MRSA PCR Screening     Status: None   Collection Time: 02/28/17  6:57 AM  Result Value Ref Range Status   MRSA by PCR NEGATIVE NEGATIVE Final    Radiology Studies: Dg Chest 2 View  Result Date: 03/05/2017 CLINICAL DATA:  Shortness of breath. EXAM: CHEST  2 VIEW COMPARISON:  03/01/2017. FINDINGS: Stable cardiomegaly with normal pulmonary vascularity. Stable left base pleuroparenchymal  thickening consistent with scarring. Bilateral pleural effusions. No pneumothorax. No acute bony abnormality. IMPRESSION: 1. Stable left base atelectatic changes and/or pleural-parenchymal scarring. 2.  Stable cardiomegaly.  Bilateral pleural effusions. Electronically Signed   By: Marcello Moores  Register   On: 03/05/2017 13:12   Nm Pulmonary Perf And Vent  Result Date: 03/05/2017 CLINICAL DATA:  Chest pain and shortness of breath associated with episode of hyperglycemia, history cirrhosis, stage III chronic kidney disease, permanent atrial fibrillation, type II diabetes mellitus EXAM: NUCLEAR MEDICINE VENTILATION - PERFUSION LUNG SCAN TECHNIQUE: Ventilation images were obtained in multiple projections using inhaled aerosol Tc-36m DTPA. Perfusion images were obtained in multiple projections after intravenous injection of Tc-33m MAA. RADIOPHARMACEUTICALS:  31.0 mCi Technetium-55m DTPA aerosol inhalation and 4.2 mCi Technetium-10m MAA IV COMPARISON:  None Correlation: Chest radiograph 03/05/2017 FINDINGS: Ventilation: Enlargement of cardiac silhouette. Diminished ventilation LEFT lower lobe. Remaining ventilation normal. Perfusion: Matching diminished perfusion LEFT lower lobe. Enlargement of cardiac silhouette. Remaining perfusion normal. Chest radiograph: Persistent LEFT lower lobe atelectasis versus  consolidation. IMPRESSION: Matching ventilation, perfusion and radiographic abnormalities of the LEFT lower lobe. By PIOPED II criteria, findings represent an intermediate probability for pulmonary embolism. Electronically Signed   By: Lavonia Dana M.D.   On: 03/05/2017 13:06    Scheduled Meds: . calcitRIOL  0.5 mcg Oral Daily  . febuxostat  40 mg Oral Daily  . lactulose  20 g Oral Daily  . metoprolol tartrate  25 mg Oral BID  . pantoprazole  40 mg Oral BID AC  . sodium chloride flush  3 mL Intravenous Q12H  . torsemide  40 mg Oral BID  . Warfarin - Pharmacist Dosing Inpatient   Does not apply q1800   Continuous Infusions: . amiodarone 30 mg/hr (03/05/17 0900)  . heparin 1,400 Units/hr (03/05/17 0800)     LOS: 8 days   Time Spent in minutes   30 minutes  MAGICK-Oliviya Gilkison MD. on 03/05/2017 at 1:16 PM  Between 7am to 7pm - Pager - [phone]  After 7pm go to [email or web address] - password [email or web address]  And look for the night coverage person covering for me after hours  [facility] Office  [phone]

## 2017-03-05 NOTE — Progress Notes (Signed)
ANTICOAGULATION CONSULT NOTE - Follow Up Consult  Pharmacy Consult for heparin Indication: atrial fibrillation  Labs:  Recent Labs  03/03/17 0249 03/03/17 1128  03/04/17 0221 03/04/17 0754 03/05/17 0217  HGB 12.0*  --   --  11.8*  --  11.6*  HCT 34.6*  --   --  33.9*  --  33.3*  PLT 103*  --   --  112*  --  114*  LABPROT 21.2*  --   --  20.9*  --  20.2*  INR 1.81  --   --  1.78  --  1.70  HEPARINUNFRC  --   --   < > 0.36 <0.10* 0.29*  CREATININE 1.41* 1.48*  --  1.44*  --   --   < > = values in this interval not displayed.   Assessment/Plan:  76yo male subtherapeutic on heparin after resumed post-cath but had only been running a short while prior to lab draw. Will continue gtt at current rate and check additional level.   Wynona Neat, PharmD, BCPS  03/05/2017,3:08 AM

## 2017-03-05 NOTE — Progress Notes (Signed)
ANTICOAGULATION CONSULT NOTE  Pharmacy Consult for Heparin and Coumadin Indication: atrial fibrillation  Labs:  Recent Labs  03/03/17 0249 03/03/17 1128  03/04/17 0221 03/04/17 0754 03/05/17 0217 03/05/17 0852  HGB 12.0*  --   --  11.8*  --  11.6*  --   HCT 34.6*  --   --  33.9*  --  33.3*  --   PLT 103*  --   --  112*  --  114*  --   LABPROT 21.2*  --   --  20.9*  --  20.2*  --   INR 1.81  --   --  1.78  --  1.70  --   HEPARINUNFRC  --   --   < > 0.36 <0.10* 0.29* 0.51  CREATININE 1.41* 1.48*  --  1.44*  --  1.64*  --   < > = values in this interval not displayed.  Estimated Creatinine Clearance: 50.5 mL/min (A) (by C-G formula based on SCr of 1.64 mg/dL (H)).   Medications: Heparin @ 1400 units/hr  Assessment: 75yom on coumadin pta for afib. INR 1.7 on admit, coumadin initially resumed (received 2 doses) then held pending cath. Heparin started 3/26 once INR < 2. S/P cath yesterday and heparin/coumadin resumed post-cath. Heparin level is therapeutic at 0.51. INR 1.7, will need to watch DI with IV amiodarone. CBC stable. No bleeding.  Goal of Therapy:  INR 2-3, aim closer 2-2.5 given liver disease Heparin level 0.3-0.7 Monitor platelets by anticoagulation protocol: Yes  Plan:  1) Continue heparin at 1400 units/hr 2) Coumadin 5mg  x 1 3) Daily heparin level, INR, CBC  Nena Jordan, PharmD, BCPS 03/05/2017 2:08 PM

## 2017-03-05 NOTE — Care Management Note (Signed)
Case Management Note Original Note Created by Lidia Collum [place] 02/26/17 at [place]  Patient Details  Name: Melvin Wang MRN: [id number] Date of Birth: Nov 15, 1941  Subjective/Objective:  Adm with Hypoglycemic event. He is from home with wife, ind with ADL's. Has a PCP, drives to appointments, reports no issues affording medications. No HH PTA. Declines HH services.    Action/Plan: Anticipate DC home with self care.   Expected Discharge Date:     02/27/2017             Expected Discharge Plan:  [place]  In-House Referral:     Discharge planning Services  CM Consult  Post Acute Care Choice:  NA Choice offered to:  NA  DME Arranged:    DME Agency:     HH Arranged:    [place] Agency:     Status of Service:  Completed, signed off  If discussed at [employer] of Stay Meetings, dates discussed:    Additional Comments: 03/05/2017 Pt remains on IV amio  02/28/17 Pt transferred from [facility] to [facility] for potential cardiac catheterization.CM assessed pt and confirmed all the above information remains accurate to date Maryclare Labrador, RN 03/05/2017, 1:20 PM

## 2017-03-06 LAB — CBC
HCT: 33.9 % — ABNORMAL LOW (ref 39.0–52.0)
Hemoglobin: 11.6 g/dL — ABNORMAL LOW (ref 13.0–17.0)
MCH: 33.3 pg (ref 26.0–34.0)
MCHC: 34.2 g/dL (ref 30.0–36.0)
MCV: 97.4 fL (ref 78.0–100.0)
Platelets: 119 K/uL — ABNORMAL LOW (ref 150–400)
RBC: 3.48 MIL/uL — ABNORMAL LOW (ref 4.22–5.81)
RDW: 15.9 % — ABNORMAL HIGH (ref 11.5–15.5)
WBC: 7.6 K/uL (ref 4.0–10.5)

## 2017-03-06 LAB — BASIC METABOLIC PANEL
Anion gap: 11 (ref 5–15)
BUN: 50 mg/dL — AB (ref 6–20)
CHLORIDE: 96 mmol/L — AB (ref 101–111)
CO2: 26 mmol/L (ref 22–32)
CREATININE: 1.71 mg/dL — AB (ref 0.61–1.24)
Calcium: 8.7 mg/dL — ABNORMAL LOW (ref 8.9–10.3)
GFR calc Af Amer: 44 mL/min — ABNORMAL LOW (ref >60)
GFR calc non Af Amer: 38 mL/min — ABNORMAL LOW (ref >60)
Glucose, Bld: 87 mg/dL (ref 65–99)
Potassium: 4.2 mmol/L (ref 3.5–5.1)
Sodium: 133 mmol/L — ABNORMAL LOW (ref 135–145)

## 2017-03-06 LAB — GLUCOSE, CAPILLARY
GLUCOSE-CAPILLARY: 150 mg/dL — AB (ref 65–99)
Glucose-Capillary: 80 mg/dL (ref 65–99)
Glucose-Capillary: 95 mg/dL (ref 65–99)
Glucose-Capillary: 137 mg/dL — ABNORMAL HIGH (ref 65–99)
Glucose-Capillary: 129 mg/dL — ABNORMAL HIGH (ref 65–99)

## 2017-03-06 LAB — PROTIME-INR
INR: 1.86
Prothrombin Time: 21.7 seconds — ABNORMAL HIGH (ref 11.4–15.2)

## 2017-03-06 LAB — HEPARIN LEVEL (UNFRACTIONATED): Heparin Unfractionated: 0.64 IU/mL (ref 0.30–0.70)

## 2017-03-06 MED ORDER — WARFARIN SODIUM 5 MG PO TABS
5.0000 mg | ORAL_TABLET | Freq: Once | ORAL | Status: AC
  Administered 2017-03-06: 5 mg via ORAL
  Filled 2017-03-06: qty 1

## 2017-03-06 MED ORDER — AMIODARONE HCL 200 MG PO TABS
400.0000 mg | ORAL_TABLET | Freq: Two times a day (BID) | ORAL | Status: DC
  Administered 2017-03-06 – 2017-03-07 (×3): 400 mg via ORAL
  Filled 2017-03-06 (×3): qty 2

## 2017-03-06 NOTE — Care Management Note (Addendum)
Case Management Note Original Note Created by Lidia Collum [place] 02/26/17 at [place]  Patient Details  Name: STETSON PELAEZ MRN: [id number] Date of Birth: October 08, 1941  Subjective/Objective:  Adm with Hypoglycemic event. He is from home with wife, ind with ADL's. Has a PCP, drives to appointments, reports no issues affording medications. No HH PTA. Declines HH services.    Action/Plan: Anticipate DC home with self care.   Expected Discharge Date:     02/27/2017             Expected Discharge Plan:  [place]  In-House Referral:     Discharge planning Services  CM Consult  Post Acute Care Choice:  NA Choice offered to:  NA  DME Arranged:    DME Agency:     HH Arranged:    [place] Agency:     Status of Service:  Completed, signed off  If discussed at [employer] of Stay Meetings, dates discussed:    Additional Comments: 03/06/2017  Attending wrote order for CPAP.  CM contacted agency of choice [facility] - agency will provide CPAP prior to discharge and set up payment as agreed.  CM will fax both order and application to sleep center once completed by attending.  Pt remains on IV amio.  CM spoke with attending - attending to review chart and will write sleep study/equipment order if she deems neccessary  03/05/17 Pt remains on IV amio  02/28/17 Pt transferred from [facility] to [facility] for potential cardiac catheterization.CM assessed pt and confirmed all the above information remains accurate to date Oneal Grout 03/06/2017, 10:36 AM

## 2017-03-06 NOTE — Progress Notes (Signed)
PROGRESS NOTE  Melvin Wang  PRF:[id number] DOB: 11/29/1941 DOA: 02/25/2017   PCP: Claretta Fraise, MD   Chief Complaint  Patient presents with  . Hypoglycemia    Brief Narrative:  76 year old man admitted to the hospital on 3/20 from home after found unresponsive, upon EMS arrival his CBG was 23. Sugars have responded to dextrose administration. Overnight events of 3/21-3/22 noted. Patient developed substernal chest pain with lateral changes on EKG. Has been seen by cardiology and is currently awaiting cardiac catheterization.  Assessment & Plan   Acute metabolic encephalopathy due to hypoglycemia  - resolved at this point, pt is calm and pleasant, able to continue conversation and participate in exam  - wants to go home latest by tomorrow   Hypoglycemia - Upon EMS arrival, blood glucose 23 - Patient has lost 25lbs  recently  - hemoglobin A1C 5.4 on 01/19/2017 - CBG remain stable   Substernal chest pain/unstable angina - With nonspecific ST changes laterally on EKG. - Cardiology consulted and appreciated, - Troponins trended, and flat (0.03-0.04)  Right heart failure - Cardiology consulted and appreciated - Discontinued spironolactone given hyperkalemia - Cath 3/27 with normal coronaries and end stage RV failure. Cardiac output OK.  - pt remains on heparin with bridging coumadin. INR 1.86 this am.  - V/Q scan per cardio done, intermediate prob for PE LLL  LLL PE - continue Coumadin   Aspiration - patient aspirated on metoprolol pill yesterday evening - CXR unremarkable - speech therapy consulted overnight and has placed patient on heart healthy diet, regular thin liquids - pt so far tolerating well   Thrombocytopenia - possible related to cirrhosis - overall improving  - Continue to monitor CBC  Hypotension - Patient was on IV lopressors, which has been discontinued - Cardiology started PO metoprolol for rate control of Afib - SBP in 90's   Hyponatremia -  Due to cirrhosis - improving overall  - BMP In AM  Permanent atrial fibrillation - Rate controlled, warfarin remains on hold due to potential of cardiac catheterization. - Currently amiodarone drip and metoprolol 25mg  BID - INR 1.86 today - Continue hearin drip  Cirrhosis of the liver - continue Lactulose  - Followed by Dr. Barney Drain   Chronic kidney disease, stage III - Creatinine 1.4 --> 1.6 --> 1.7 this AM - likely up post cath  - BMP In AM  Hyperkalemia - Potassium elevated on 3/26, discontinued spironolactone and given Kayexalate - now WNL this AM - BMP In AM  Physical deconditioning - Likely secondary to comorbidities  - PT consulted for assistance   DVT Prophylaxis  Heparin --> coumadin   Code Status: Full  Family Communication: Wife at bedside  Disposition Plan: to be determined, awaiting PT eval, also once cardio team clears, possibly in AM  Consultants  Cardiology  Procedures   None  Antibiotics    None  Subjective:   No concerns this AM, pt denies chest pain or dyspnea.   Objective:   Vitals:   03/06/17 0800 03/06/17 0826 03/06/17 0913 03/06/17 1144  BP:  (!) 81/65 (!) 108/93 93/72  Pulse:  87 83 75  Resp: 19 19  (!) 22  Temp:  (!) 96.4 F (35.8 C)  98.1 F (36.7 C)  TempSrc:  Axillary  Oral  SpO2:  95%  95%  Weight:      Height:        Intake/Output Summary (Last 24 hours) at 03/06/17 1323 Last data filed at 03/06/17 0800  Gross  per 24 hour  Intake           1035.4 ml  Output                0 ml  Net           1035.4 ml   Filed Weights   02/27/17 0800 03/04/17 0637 03/06/17 0430  Weight: 99.7 kg (219 lb 12.8 oz) 102.7 kg (226 lb 6.4 oz) 101.4 kg (223 lb 9.6 oz)    Exam  General: Well developed, well nourished, NAD  HEENT: NCAT, mucous membranes moist. Poor dentition   Neck: Supple, + JVD, no masses  Cardiovascular: S1 S2 auscultated, irregularly irregular, 2/6 murmur  Respiratory: Diminished but clear breath  sounds  Abdomen: Soft, nontender, nondistended, + bowel sounds  Extremities: warm dry without cyanosis clubbing, trace LE edema  Data Reviewed: I have personally reviewed following labs and imaging studies  CBC:  Recent Labs Lab 03/02/17 0801 03/03/17 0249 03/04/17 0221 03/05/17 0217 03/06/17 0249  WBC 9.3 8.2 7.5 7.1 7.6  HGB 12.2* 12.0* 11.8* 11.6* 11.6*  HCT 36.2* 34.6* 33.9* 33.3* 33.9*  MCV 100.3* 99.1 98.5 98.2 97.4  PLT 96* 103* 112* 114* 237*   Basic Metabolic Panel:  Recent Labs Lab 03/03/17 0249 03/03/17 1128 03/04/17 0221 03/05/17 0217 03/06/17 0249  NA 128* 131* 131* 133* 133*  K 5.7* 4.5 4.2 4.2 4.2  CL 95* 95* 96* 97* 96*  CO2 21* 26 25 27 26   GLUCOSE 91 124* 101* 126* 87  BUN 38* 40* 42* 45* 50*  CREATININE 1.41* 1.48* 1.44* 1.64* 1.71*  CALCIUM 8.3* 8.5* 8.4* 8.3* 8.7*   Liver Function Tests:  Recent Labs Lab 02/28/17 1110  AST 28  ALT 26  ALKPHOS 208*  BILITOT 5.0*  PROT 7.8  ALBUMIN 2.8*    Recent Labs Lab 02/28/17 1110 03/01/17 1021 03/02/17 0801 03/04/17 0221  AMMONIA 36* 25 24 35   Coagulation Profile:  Recent Labs Lab 03/02/17 0328 03/03/17 0249 03/04/17 0221 03/05/17 0217 03/06/17 0249  INR 2.17 1.81 1.78 1.70 1.86   Cardiac Enzymes:  Recent Labs Lab 02/27/17 1824  TROPONINI 0.03*   CBG:  Recent Labs Lab 03/05/17 1412 03/05/17 1601 03/05/17 2119 03/06/17 0822 03/06/17 1142  GLUCAP 108* 110* 150* 80 95   Recent Results (from the past 240 hour(s))  MRSA PCR Screening     Status: Abnormal   Collection Time: 02/26/17  5:20 AM  Result Value Ref Range Status   MRSA by PCR INVALID RESULTS, SPECIMEN SENT FOR CULTURE (A) NEGATIVE Final    Comment: RESULT CALLED TO, READ BACK BY AND VERIFIED WITH: PHILLIPS C. AT 0758A ON [id number] BY THOMPSON S.   MRSA culture     Status: None   Collection Time: 02/26/17  5:20 AM  Result Value Ref Range Status   Specimen Description NOSE  Final   Special Requests NONE   Final   Culture   Final    NO MRSA DETECTED Performed at [facility], [address] [place], [place] [address]    Report Status 02/28/2017 FINAL  Final  MRSA PCR Screening     Status: None   Collection Time: 02/28/17  6:57 AM  Result Value Ref Range Status   MRSA by PCR NEGATIVE NEGATIVE Final    Radiology Studies: Dg Chest 2 View  Result Date: 03/05/2017 CLINICAL DATA:  Shortness of breath. EXAM: CHEST  2 VIEW COMPARISON:  03/01/2017. FINDINGS: Stable cardiomegaly with normal pulmonary  vascularity. Stable left base pleuroparenchymal thickening consistent with scarring. Bilateral pleural effusions. No pneumothorax. No acute bony abnormality. IMPRESSION: 1. Stable left base atelectatic changes and/or pleural-parenchymal scarring. 2.  Stable cardiomegaly.  Bilateral pleural effusions. Electronically Signed   By: Marcello Moores  Register   On: 03/05/2017 13:12   Nm Pulmonary Perf And Vent  Result Date: 03/05/2017 CLINICAL DATA:  Chest pain and shortness of breath associated with episode of hyperglycemia, history cirrhosis, stage III chronic kidney disease, permanent atrial fibrillation, type II diabetes mellitus EXAM: NUCLEAR MEDICINE VENTILATION - PERFUSION LUNG SCAN TECHNIQUE: Ventilation images were obtained in multiple projections using inhaled aerosol Tc-67m DTPA. Perfusion images were obtained in multiple projections after intravenous injection of Tc-23m MAA. RADIOPHARMACEUTICALS:  31.0 mCi Technetium-5m DTPA aerosol inhalation and 4.2 mCi Technetium-58m MAA IV COMPARISON:  None Correlation: Chest radiograph 03/05/2017 FINDINGS: Ventilation: Enlargement of cardiac silhouette. Diminished ventilation LEFT lower lobe. Remaining ventilation normal. Perfusion: Matching diminished perfusion LEFT lower lobe. Enlargement of cardiac silhouette. Remaining perfusion normal. Chest radiograph: Persistent LEFT lower lobe atelectasis versus consolidation. IMPRESSION: Matching ventilation, perfusion and  radiographic abnormalities of the LEFT lower lobe. By PIOPED II criteria, findings represent an intermediate probability for pulmonary embolism. Electronically Signed   By: Lavonia Dana M.D.   On: 03/05/2017 13:06    Scheduled Meds: . amiodarone  400 mg Oral BID  . calcitRIOL  0.5 mcg Oral Daily  . febuxostat  40 mg Oral Daily  . lactulose  20 g Oral Daily  . metoprolol tartrate  25 mg Oral BID  . pantoprazole  40 mg Oral BID AC  . sodium chloride flush  3 mL Intravenous Q12H  . torsemide  40 mg Oral BID  . warfarin  5 mg Oral ONCE-1800  . Warfarin - Pharmacist Dosing Inpatient   Does not apply q1800   Continuous Infusions: . heparin 1,400 Units/hr (03/06/17 1230)     LOS: 9 days   Time Spent in minutes   30 minutes  MAGICK-MYERS, ISKRA MD. on 03/06/2017 at 1:23 PM  Between 7am to 7pm - Pager - [phone]  After 7pm go to [email or web address] - password [email or web address]  And look for the night coverage person covering for me after hours  [facility] Office  [phone]

## 2017-03-06 NOTE — Progress Notes (Signed)
Advanced Heart Failure Rounding Note   Subjective:    IV amio started 3/24 for AF with RVR with rates up to 160.   Remains on amio gtt. Remains in Afib but rates in 80s.   Feeling good this am. Denies SOB, CP, lightheadedness or dizziness.   + 1.5 L. Weight shows down 3 lbs over 2 days. Creatinine trending up on po torsemide, but also had recent cath.     INR 1.86. Coumadin resumed s/p cath 03/04/17  Cath 03/04/17 with no CAD, relatively preserved cardiac output, and end stage RV failure. Full results as below.   Feels great. Able to walk with PT today. Then have recommended HHPT. Still oozing from brachial cath site. Denies SOB, orthopnea. No encephalopathy. AF rates in 80s.   VQ Scan 03/05/17 Matching ventilation, perfusion and radiographic abnormalities of the LEFT lower lobe. - By PIOPED II criteria, findings represent an intermediate probably for PE.   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  Objective:   Weight Range:  Vital Signs:   Temp:  [96.4 F (35.8 C)-98.2 F (36.8 C)] 96.4 F (35.8 C) (03/29 0826) Pulse Rate:  [64-90] 87 (03/29 0826) Resp:  [18-27] 19 (03/29 0826) BP: (81-99)/(55-66) 81/65 (03/29 0826) SpO2:  [90 %-95 %] 95 % (03/29 0826) Weight:  [223 lb 9.6 oz (101.4 kg)] 223 lb 9.6 oz (101.4 kg) (03/29 0430) Last BM Date: 03/05/17  Weight change: Filed Weights   02/27/17 0800 03/04/17 0637 03/06/17 0430  Weight: 219 lb 12.8 oz (99.7 kg) 226 lb 6.4 oz (102.7 kg) 223 lb 9.6 oz (101.4 kg)    Intake/Output:   Intake/Output Summary (Last 24 hours) at 03/06/17 0853 Last data filed at 03/06/17 0800  Gross per 24 hour  Intake           1096.8 ml  Output                0 ml  Net            1096.8 ml     Physical Exam: General: Seated in recliner. Wife present.    HEENT: Normal anicteric. Poor dentition Neck: Supple. JVP 8 cm with prominent CV waves. Carotids 2+ bilat; no bruits. No thyromegaly or nodule noted.   Cor: PMI nondisplaced. Irr, Irr. 2/6 TR. + RV lift  Lungs: clear no wheezing Abdomen: soft, NT, ND, no HSM. No bruits or masses. +BS   Extremities: no cyanosis, clubbing, rash. Trace ankle edema. Chronic venous stasis changes to bilateral LEs. Oozing from left AC brachial site Neuro: Alert & oriented x 3. Cranial nerves grossly intact. Moves all 4 extremities w/o difficulty. Affect pleasant  No focal deificits  Telemetry: AF 80-90s. Personally reviewed    Labs: Basic Metabolic Panel:  Recent Labs Lab 03/03/17 0249 03/03/17 1128 03/04/17 0221 03/05/17 0217 03/06/17 0249  NA 128* 131* 131* 133* 133*  K 5.7* 4.5 4.2 4.2 4.2  CL 95* 95* 96* 97* 96*  CO2 21* 26 25 27 26   GLUCOSE 91 124* 101* 126* 87  BUN 38* 40* 42* 45* 50*  CREATININE 1.41* 1.48* 1.44* 1.64* 1.71*  CALCIUM 8.3* 8.5* 8.4* 8.3*  8.7*    Liver Function Tests:  Recent Labs Lab 02/28/17 1110  AST 28  ALT 26  ALKPHOS 208*  BILITOT 5.0*  PROT 7.8  ALBUMIN 2.8*   No results for input(s): LIPASE, AMYLASE in the last 168 hours.  Recent Labs Lab 03/01/17 1021 03/02/17 0801 03/04/17 0221  AMMONIA 25 24 35    CBC:  Recent Labs Lab 03/02/17 0801 03/03/17 0249 03/04/17 0221 03/05/17 0217 03/06/17 0249  WBC 9.3 8.2 7.5 7.1 7.6  HGB 12.2* 12.0* 11.8* 11.6* 11.6*  HCT 36.2* 34.6* 33.9* 33.3* 33.9*  MCV 100.3* 99.1 98.5 98.2 97.4  PLT 96* 103* 112* 114* 119*    Cardiac Enzymes:  Recent Labs Lab 02/27/17 1226 02/27/17 1824  TROPONINI 0.03* 0.03*    BNP: BNP (last 3 results) No results for input(s): BNP in the last 8760 hours.  ProBNP (last 3 results) No results for input(s): PROBNP in the last 8760 hours.    Other results:  Imaging: Dg Chest 2  View  Result Date: 03/05/2017 CLINICAL DATA:  Shortness of breath. EXAM: CHEST  2 VIEW COMPARISON:  03/01/2017. FINDINGS: Stable cardiomegaly with normal pulmonary vascularity. Stable left base pleuroparenchymal thickening consistent with scarring. Bilateral pleural effusions. No pneumothorax. No acute bony abnormality. IMPRESSION: 1. Stable left base atelectatic changes and/or pleural-parenchymal scarring. 2.  Stable cardiomegaly.  Bilateral pleural effusions. Electronically Signed   By: Marcello Moores  Register   On: 03/05/2017 13:12   Nm Pulmonary Perf And Vent  Result Date: 03/05/2017 CLINICAL DATA:  Chest pain and shortness of breath associated with episode of hyperglycemia, history cirrhosis, stage III chronic kidney disease, permanent atrial fibrillation, type II diabetes mellitus EXAM: NUCLEAR MEDICINE VENTILATION - PERFUSION LUNG SCAN TECHNIQUE: Ventilation images were obtained in multiple projections using inhaled aerosol Tc-44m DTPA. Perfusion images were obtained in multiple projections after intravenous injection of Tc-34m MAA. RADIOPHARMACEUTICALS:  31.0 mCi Technetium-33m DTPA aerosol inhalation and 4.2 mCi Technetium-45m MAA IV COMPARISON:  None Correlation: Chest radiograph 03/05/2017 FINDINGS: Ventilation: Enlargement of cardiac silhouette. Diminished ventilation LEFT lower lobe. Remaining ventilation normal. Perfusion: Matching diminished perfusion LEFT lower lobe. Enlargement of cardiac silhouette. Remaining perfusion normal. Chest radiograph: Persistent LEFT lower lobe atelectasis versus consolidation. IMPRESSION: Matching ventilation, perfusion and radiographic abnormalities of the LEFT lower lobe. By PIOPED II criteria, findings represent an intermediate probability for pulmonary embolism. Electronically Signed   By: Lavonia Dana M.D.   On: 03/05/2017 13:06     Medications:     Scheduled Medications: . calcitRIOL  0.5 mcg Oral Daily  . febuxostat  40 mg Oral Daily  . lactulose  20 g  Oral Daily  . metoprolol tartrate  25 mg Oral BID  . pantoprazole  40 mg Oral BID AC  . sodium chloride flush  3 mL Intravenous Q12H  . torsemide  40 mg Oral BID  . Warfarin - Pharmacist Dosing Inpatient   Does not apply q1800    Infusions: . amiodarone 30 mg/hr (03/06/17 0800)  . heparin 1,400 Units/hr (03/06/17 0800)    PRN Medications: sodium chloride, acetaminophen, ipratropium, levalbuterol, ondansetron **OR** ondansetron (ZOFRAN) IV, ondansetron (ZOFRAN) IV, promethazine, sodium chloride flush, technetium TC 42M diethylenetriame-pentaacetic acid   Assessment:   1. Acute on chronic diastolic CHF 2. Hypoglycemia 3. Unstable angina 4. Chronic Atrial fibrillation now with RVR 5. Hyponatremia/hyperkalemia 6. Cirrhosis of the liver with ascites 7. Type 2 diabetes mellitus  8. Acute on CKD stage III 9. Hyperkalemia  Plan/Discussion:    AF rate improved on amio.  Will transition to po amiodarone at 400 mg BID for three days and then down to 200 mg BID. Remains on heparin with bridging coumadin. INR 1.86 this am.   Cath 03/05/17 with normal coronaries and end stage RV failure. Cardiac output OK.   Volume status looks OK. K stable. Continue po torsemide.   Creatinine gently trending up on po torsemide and after cath 03/04/17. Continue to follow.    Continue lactulose for hepatic encephalopathy.   VQ Scan 03/05/17 Matching ventilation, perfusion and radiographic abnormalities of the LEFT lower lobe. - By PIOPED II criteria, findings represent an intermediate probably for PE.   Pt currently covered with heparin and INR nearing therapeutic range.   Will need sleep study as outpatient.   Possibly home tomorrow.   Length of Stay: [place], [place]  03/06/2017, 8:53 AM  Advanced Heart Failure Team Pager [phone] (M-F; 7a - 4p)  Please contact [facility] for night-coverage after hours (4p -7a ) and weekends on [email or web address]  Patient seen and examined with  Oda Kilts, PA-C. We discussed all aspects of the encounter. I agree with the assessment and plan as stated above.    Continues to improve. Doing well despite advanced RV failure. AF rate slowed on IV amio. Will switch to po. Continue to load warfarin. Seen by PT today and recommending HHPT. We will arrange.   Hopefully home in am.  Glori Bickers, MD  10:59 PM

## 2017-03-06 NOTE — Progress Notes (Signed)
Nutrition Follow-up  DOCUMENTATION CODES:   Non-severe (moderate) malnutrition in context of chronic illness  INTERVENTION:   -Continue Snacks TID  NUTRITION DIAGNOSIS:   Malnutrition related to chronic illness as evidenced by percent weight loss, mild depletion of muscle mass, moderate depletions of muscle mass, mild depletion of body fat, moderate depletion of body fat.  Progressing  GOAL:   Patient will meet greater than or equal to 90% of their needs  Progressing  MONITOR:   PO intake, Labs, Weight trends, Skin, I & O's  REASON FOR ASSESSMENT:   Malnutrition Screening Tool    ASSESSMENT:   Melvin Wang is a 76 y.o. male with medical history significant of gait abnormality, cataract, stage III chronic kidney disease, type 2 diabetes, essential tremor, gout, hyperlipidemia, hypertension, permanent atrial fibrillation on warfarin who was brought to the emergency department via EMS after he was found unresponsive by his wife.  3/25- s/p SLP eval; continue regular consistency diet with thin liquids 3/26- s/p heart cath, which revealed end stage RV failure  Spoke with pt and wife, who report that appetite is slowly improving. Meal completion 75-100%. Pt is eager to go home and was able to teach back to this RD the importance of good nutrition to support healing. Pt also shares that he will continue to make good food choices (increased fruit and vegetable consumption) to assist with diet compliance.   Labs reviewed: Na: 133, CBGS: 110-150.  Diet Order:  Diet Carb Modified Fluid consistency: Thin; Room service appropriate? Yes  Skin:  Reviewed, no issues  Last BM:  03/05/17  Height:   Ht Readings from Last 1 Encounters:  02/27/17 6\' 1"  (1.854 m)    Weight:   Wt Readings from Last 1 Encounters:  03/06/17 223 lb 9.6 oz (101.4 kg)    Ideal Body Weight:  83.6 kg  BMI:  Body mass index is 29.5 kg/m.  Estimated Nutritional Needs:   Kcal:   2200-2400  Protein:  120-135 grams  Fluid:  2.2-2.4 L  EDUCATION NEEDS:   Education needs addressed  [name] A. Jimmye Norman, RD, LDN, CDE Pager: [phone] After hours Pager: [phone]

## 2017-03-06 NOTE — Evaluation (Signed)
Physical Therapy Evaluation Patient Details Name: Melvin Wang MRN: [id number] DOB: 1941/09/29 Today's Date: 03/06/2017   History of Present Illness  Pt is a 76 y/o male found down and unresponsive at home by his wife. Upon arrival to the ED, pt's CBG was 23. Pt also had a cardiac cath procedure on 3/27 that revealed "normal coronaries". PMH including but not limited to CKD, DM, essential tremor, HTN, a-fib and cirrhosis.  Clinical Impression  Pt presented sitting OOB in recliner chair when therapist entered room. Prior to admission, pt reported that he was independent with ADLs and used a quad cane to ambulate. Pt currently requires mod A for sit-to-stand transfers from the recliner chair (low surface). Pt stated that his bed and furniture at home was much higher. He also stated that he was planning to get a lift chair soon. Pt ambulated 36' with RW and min guard for safety. All VSS throughout. Pt would continue to benefit from skilled physical therapy services at this time while admitted and after d/c to address the below listed limitations in order to improve overall safety and independence with functional mobility.     Follow Up Recommendations Home health PT;Supervision/Assistance - 24 hour    Equipment Recommendations  None recommended by PT    Recommendations for Other Services       Precautions / Restrictions Restrictions Weight Bearing Restrictions: No      Mobility  Bed Mobility               General bed mobility comments: pt sitting OOB in recliner chair when therapist entered room  Transfers Overall transfer level: Needs assistance Equipment used: Rolling walker (2 wheeled) Transfers: Sit to/from Stand Sit to Stand: Mod assist         General transfer comment: increased time, VC'ing for bilateral UE placement and mod A to rise from low recliner chair.   Ambulation/Gait Ambulation/Gait assistance: Min guard Ambulation Distance (Feet): 50  Feet Assistive device: Rolling walker (2 wheeled) Gait Pattern/deviations: Step-through pattern;Decreased step length - right;Decreased step length - left;Decreased stride length Gait velocity: decreased Gait velocity interpretation: Below normal speed for age/gender General Gait Details: modest instability with ambulation using a RW but no LOB or need for physical assistance, min guard for safety  Stairs            Wheelchair Mobility    Modified Rankin (Stroke Patients Only)       Balance Overall balance assessment: Needs assistance Sitting-balance support: Feet supported Sitting balance-Leahy Scale: Fair     Standing balance support: During functional activity;Bilateral upper extremity supported Standing balance-Leahy Scale: Poor Standing balance comment: pt reliant on bilateral UEs on RW                             Pertinent Vitals/Pain Pain Assessment: No/denies pain    Home Living Family/patient expects to be discharged to:: Private residence Living Arrangements: Spouse/significant other Available Help at Discharge: Family;Available 24 hours/day Type of Home: House Home Access: Ramped entrance     Home Layout: One level Home Equipment: Walker - 2 wheels;Cane - quad;Shower seat;Bedside commode      Prior Function Level of Independence: Independent with assistive device(s)         Comments: pt reported that he ambulates with use of a quad cane     Hand Dominance        Extremity/Trunk Assessment   Upper Extremity Assessment Upper Extremity  Assessment: RUE deficits/detail;LUE deficits/detail RUE Deficits / Details: pt with constant tremors bilaterally    Lower Extremity Assessment Lower Extremity Assessment: Generalized weakness       Communication   Communication: No difficulties  Cognition Arousal/Alertness: Awake/alert Behavior During Therapy: WFL for tasks assessed/performed Overall Cognitive Status: Within Functional  Limits for tasks assessed                                        General Comments      Exercises     Assessment/Plan    PT Assessment Patient needs continued PT services  PT Problem List Decreased strength;Decreased activity tolerance;Decreased balance;Decreased mobility;Decreased coordination;Decreased knowledge of use of DME;Decreased safety awareness       PT Treatment Interventions DME instruction;Gait training;Stair training;Functional mobility training;Therapeutic activities;Therapeutic exercise;Balance training;Neuromuscular re-education;Patient/family education    PT Goals (Current goals can be found in the Care Plan section)  Acute Rehab PT Goals Patient Stated Goal: return home tomorrow PT Goal Formulation: With patient/family Time For Goal Achievement: 03/20/17 Potential to Achieve Goals: Good    Frequency Min 3X/week   Barriers to discharge        Co-evaluation               End of Session Equipment Utilized During Treatment: Gait belt Activity Tolerance: Patient limited by fatigue Patient left: in chair;with call bell/phone within reach;with family/visitor present Nurse Communication: Mobility status PT Visit Diagnosis: Unsteadiness on feet (R26.81);Other abnormalities of gait and mobility (R26.89)    Time: 7482-7078 PT Time Calculation (min) (ACUTE ONLY): 29 min   Charges:   PT Evaluation $PT Eval Moderate Complexity: 1 Procedure PT Treatments $Gait Training: 8-22 mins   PT G Codes:        [place], PT, DPT [place] 03/06/2017, 3:29 PM

## 2017-03-06 NOTE — Progress Notes (Addendum)
ANTICOAGULATION CONSULT NOTE  Pharmacy Consult for Heparin and Coumadin Indication: atrial fibrillation  Labs:  Recent Labs  03/04/17 0221  03/05/17 0217 03/05/17 0852 03/06/17 0249  HGB 11.8*  --  11.6*  --  11.6*  HCT 33.9*  --  33.3*  --  33.9*  PLT 112*  --  114*  --  119*  LABPROT 20.9*  --  20.2*  --  21.7*  INR 1.78  --  1.70  --  1.86  HEPARINUNFRC 0.36  < > 0.29* 0.51 0.64  CREATININE 1.44*  --  1.64*  --  1.71*  < > = values in this interval not displayed.  Estimated Creatinine Clearance: 48.2 mL/min (A) (by C-G formula based on SCr of 1.71 mg/dL (H)).   Medications: Heparin @ 1400 units/hr  Assessment: 75yom on coumadin pta for afib. INR 1.7 on admit, coumadin initially resumed (received 2 doses) then held pending cath. Heparin started 3/26 once INR < 2. S/P cath 3/27 and heparin/coumadin resumed post-cath. Heparin level is therapeutic at 0.64. INR up to 1.86, will need to watch DI with IV amiodarone. CBC stable. No bleeding.  VQ scan: intermediate probability for PE  Goal of Therapy:  INR 2-3, aim closer 2-2.5 given liver disease Heparin level 0.3-0.7 Monitor platelets by anticoagulation protocol: Yes  Plan:  1) Continue heparin at 1400 units/hr 2) Coumadin 5mg  x 1 3) Daily heparin level, INR, CBC  Nena Jordan, PharmD, BCPS 03/06/2017 11:58 AM

## 2017-03-07 ENCOUNTER — Other Ambulatory Visit: Payer: Self-pay | Admitting: Family Medicine

## 2017-03-07 ENCOUNTER — Encounter (HOSPITAL_COMMUNITY): Payer: Self-pay | Admitting: Internal Medicine

## 2017-03-07 LAB — HEPARIN LEVEL (UNFRACTIONATED): HEPARIN UNFRACTIONATED: 0.71 [IU]/mL — AB (ref 0.30–0.70)

## 2017-03-07 LAB — GLUCOSE, CAPILLARY
GLUCOSE-CAPILLARY: 89 mg/dL (ref 65–99)
Glucose-Capillary: 105 mg/dL — ABNORMAL HIGH (ref 65–99)

## 2017-03-07 LAB — BASIC METABOLIC PANEL
ANION GAP: 11 (ref 5–15)
BUN: 52 mg/dL — AB (ref 6–20)
CHLORIDE: 96 mmol/L — AB (ref 101–111)
CO2: 26 mmol/L (ref 22–32)
Calcium: 8.7 mg/dL — ABNORMAL LOW (ref 8.9–10.3)
Creatinine, Ser: 1.79 mg/dL — ABNORMAL HIGH (ref 0.61–1.24)
GFR calc Af Amer: 42 mL/min — ABNORMAL LOW (ref >60)
GFR, EST NON AFRICAN AMERICAN: 36 mL/min — AB (ref >60)
Glucose, Bld: 105 mg/dL — ABNORMAL HIGH (ref 65–99)
POTASSIUM: 4 mmol/L (ref 3.5–5.1)
SODIUM: 133 mmol/L — AB (ref 135–145)

## 2017-03-07 LAB — CBC
HCT: 32.8 % — ABNORMAL LOW (ref 39.0–52.0)
HEMOGLOBIN: 11.5 g/dL — AB (ref 13.0–17.0)
MCH: 34.1 pg — ABNORMAL HIGH (ref 26.0–34.0)
MCHC: 35.1 g/dL (ref 30.0–36.0)
MCV: 97.3 fL (ref 78.0–100.0)
Platelets: 129 10*3/uL — ABNORMAL LOW (ref 150–400)
RBC: 3.37 MIL/uL — AB (ref 4.22–5.81)
RDW: 16.2 % — ABNORMAL HIGH (ref 11.5–15.5)
WBC: 7.6 10*3/uL (ref 4.0–10.5)

## 2017-03-07 LAB — PROTIME-INR
INR: 2.19
Prothrombin Time: 24.7 seconds — ABNORMAL HIGH (ref 11.4–15.2)

## 2017-03-07 MED ORDER — INSULIN GLARGINE 100 UNIT/ML SOLOSTAR PEN: 10.0000 [IU] | PEN_INJECTOR | Freq: Every morning | SUBCUTANEOUS | 2 refills | Status: DC

## 2017-03-07 MED ORDER — METOPROLOL TARTRATE 25 MG PO TABS: 25.0000 mg | ORAL_TABLET | Freq: Two times a day (BID) | ORAL | 0 refills | Status: DC

## 2017-03-07 MED ORDER — AMIODARONE HCL 200 MG PO TABS: 200.0000 mg | ORAL_TABLET | Freq: Two times a day (BID) | ORAL | Status: DC

## 2017-03-07 MED ORDER — AMIODARONE HCL 200 MG PO TABS: 200.0000 mg | ORAL_TABLET | Freq: Two times a day (BID) | ORAL | 0 refills | Status: AC

## 2017-03-07 MED ORDER — LIDOCAINE-EPINEPHRINE 2 %-1:100000 IJ SOLN
20.0000 mL | Freq: Once | INTRAMUSCULAR | Status: DC
  Filled 2017-03-07: qty 20

## 2017-03-07 NOTE — Care Management (Signed)
Patient and wife state they had not declined [facility], they want to use [facility]. CM contacted Vito Berger with [facility] referral.

## 2017-03-07 NOTE — Progress Notes (Signed)
ANTICOAGULATION CONSULT NOTE  Pharmacy Consult for Coumadin Indication: atrial fibrillation  Labs:  Recent Labs  03/05/17 0217 03/05/17 0852 03/06/17 0249 03/07/17 0302  HGB 11.6*  --  11.6* 11.5*  HCT 33.3*  --  33.9* 32.8*  PLT 114*  --  119* 129*  LABPROT 20.2*  --  21.7* 24.7*  INR 1.70  --  1.86 2.19  HEPARINUNFRC 0.29* 0.51 0.64 0.71*  CREATININE 1.64*  --  1.71* 1.79*    Estimated Creatinine Clearance: 46 mL/min (A) (by C-G formula based on SCr of 1.79 mg/dL (H)).   Assessment: 75yom on coumadin pta for afib. INR 1.7 on admit, coumadin initially resumed (received 2 doses) then held pending cath. Heparin started 3/26 once INR < 2. S/P cath 3/27 and heparin/coumadin resumed post-cath. INR therapeutic today at 2.19 so can stop heparin. Amiodarone changed to PO (amiodarone is new for him).  VQ scan: intermediate probability for PE  Goal of Therapy:  INR 2-3, aim closer 2-2.5 given liver disease Monitor platelets by anticoagulation protocol: Yes  Plan:  1) Stop heparin 2) Resume home coumadin regimen 5mg  daily except none on Mon/Fri - may need to eventually decrease weekly dose with addition of amiodarone  Nena Jordan, PharmD, BCPS 03/07/2017 10:16 AM

## 2017-03-07 NOTE — Discharge Summary (Signed)
Physician Discharge Summary  Melvin Wang YQI:[id number] DOB: [birth year] DOA: 02/25/2017  PCP: Claretta Fraise, MD  Admit date: 02/25/2017 Discharge date: 03/07/2017  Recommendations for Outpatient Follow-up:  1. Pt will need to follow up with PCP in 1-2 weeks post discharge 2. Please obtain BMP to evaluate electrolytes and kidney function 3. Please also check CBC to evaluate Hg and Hct levels 4. Please note that Spironolactone was discontinued due to hyperkalemia  5. Pt has refused lactulose and this medication was removed from his medication list  6. Please note that lantus dose was lowered to 10 U due to hypoglycemic events  7. Pt will need sleep study as outpatient  8. HF meds for home Amiodarone 200 mg BID Lopressor 25 mg BID Torsemide 40 mg q am and 20 mg q pm.   Discharge Diagnoses:  Principal Problem:   Hypoglycemia Active Problems:   Tremor, essential   Essential hypertension   Permanent atrial fibrillation (HCC)   Gout   Type 2 diabetes mellitus with renal complication (HCC)   Malnutrition of moderate degree   Chronic atrial fibrillation (HCC)   RVF (right ventricular failure) (HCC)   AKI (acute kidney injury) ([place])   PAH (pulmonary artery hypertension)  Discharge Condition: Stable  Diet recommendation: Heart healthy diet discussed in details       Brief Narrative:  76 year old man admitted to the hospital on 3/20 from home after found unresponsive, upon EMS arrival his CBG was 23. Sugars have responded to dextrose administration.Overnight events of 3/21-3/22 noted. Patient developed substernal chest pain with lateral changes on EKG. Has been seen by cardiology and is currently awaiting cardiac catheterization.  Assessment & Plan   Acute metabolic encephalopathy due to hypoglycemia  - resolved at this point, pt is calm and pleasant, able to continue conversation and participate in exam  - wants to go home   Hypoglycemia - Upon EMS arrival, blood  glucose 23 - Patient has lost 25lbs  recently  - hemoglobin A1C 5.4 on 01/19/2017 - CBG remain stable  - dose of lantus lowered to 10 U due to hypoglycemic events   Substernal chest pain/unstable angina - With nonspecific ST changes laterally on EKG. - Cardiology consulted and appreciated, - Troponins trended, and flat (0.03-0.04) - HF meds for home Amiodarone 200 mg BID Lopressor 25 mg BID Torsemide 40 mg q am and 20 mg q pm.   Right heart failure - Cardiology consulted and appreciated - Discontinued spironolactone given hyperkalemia - Cath 3/27 with normal coronaries and end stage RV failure. Cardiac output OK.  - continue with Coumadin upon discharge  - V/Q scan per cardio done, intermediate prob for PE LLL  LLL PE - continue Coumadin   Aspiration - patient aspirated on metoprolol pill yesterday evening - CXR unremarkable - speech therapy consulted overnight and has placed patient on heart healthy diet, regular thin liquids - pt so far tolerating well   Thrombocytopenia - possible related to cirrhosis - overall improving   Hypotension - Patient was on IV lopressors, which has been discontinued - Cardiology started PO metoprolol for rate control of Afib  Hyponatremia - Due to cirrhosis - improving overall   Permanent atrial fibrillation - Rate controlled - continue Coumadin upon discharge  - also on amiodarone and metoprolol   Cirrhosis of the liver - continue Lactulose  - Followed by Dr. Barney Drain   Chronic kidney disease, stage III - Creatinine 1.4 --> 1.6 --> 1.7 this AM - likely up post cath  -  needs outpatient follow up  Hyperkalemia - Potassium elevated on 3/26, discontinued spironolactone and given Kayexalate - now WNL this AM  Physical deconditioning - Likely secondary to comorbidities   DVT Prophylaxis  Heparin --> coumadin   Code Status: Full  Family Communication: Wife at bedside  Disposition Plan: home    Consultants  Cardiology  Procedures   None  Antibiotics    None  Procedures/Studies: Dg Chest 2 View  Result Date: 03/05/2017 CLINICAL DATA:  Shortness of breath. EXAM: CHEST  2 VIEW COMPARISON:  03/01/2017. FINDINGS: Stable cardiomegaly with normal pulmonary vascularity. Stable left base pleuroparenchymal thickening consistent with scarring. Bilateral pleural effusions. No pneumothorax. No acute bony abnormality. IMPRESSION: 1. Stable left base atelectatic changes and/or pleural-parenchymal scarring. 2.  Stable cardiomegaly.  Bilateral pleural effusions. Electronically Signed   By: Marcello Moores  Register   On: 03/05/2017 13:12   Nm Pulmonary Perf And Vent  Result Date: 03/05/2017 CLINICAL DATA:  Chest pain and shortness of breath associated with episode of hyperglycemia, history cirrhosis, stage III chronic kidney disease, permanent atrial fibrillation, type II diabetes mellitus EXAM: NUCLEAR MEDICINE VENTILATION - PERFUSION LUNG SCAN TECHNIQUE: Ventilation images were obtained in multiple projections using inhaled aerosol Tc-2m DTPA. Perfusion images were obtained in multiple projections after intravenous injection of Tc-9m MAA. RADIOPHARMACEUTICALS:  31.0 mCi Technetium-26m DTPA aerosol inhalation and 4.2 mCi Technetium-83m MAA IV COMPARISON:  None Correlation: Chest radiograph 03/05/2017 FINDINGS: Ventilation: Enlargement of cardiac silhouette. Diminished ventilation LEFT lower lobe. Remaining ventilation normal. Perfusion: Matching diminished perfusion LEFT lower lobe. Enlargement of cardiac silhouette. Remaining perfusion normal. Chest radiograph: Persistent LEFT lower lobe atelectasis versus consolidation. IMPRESSION: Matching ventilation, perfusion and radiographic abnormalities of the LEFT lower lobe. By PIOPED II criteria, findings represent an intermediate probability for pulmonary embolism. Electronically Signed   By: Lavonia Dana M.D.   On: 03/05/2017 13:06   Dg Chest Port 1  View  Result Date: 03/01/2017 CLINICAL DATA:  76 year old male with aspiration. EXAM: PORTABLE CHEST 1 VIEW COMPARISON:  Chest radiograph dated 02/28/2017 FINDINGS: There stable cardiomegaly. No vascular congestion or edema. There is silhouetting of the left hemidiaphragm likely related to cardiomegaly. Underline left lung base atelectasis or infiltrate is not entirely excluded. Clinical correlation is recommended with there is no pneumothorax. There is atherosclerotic calcification of the thoracic aorta. No acute osseous pathology. IMPRESSION: Stable cardiomegaly. No interval change in the appearance of the lungs. Electronically Signed   By: Anner Crete M.D.   On: 03/01/2017 21:54   Dg Chest Port 1 View  Result Date: 02/28/2017 CLINICAL DATA:  Cough for 1 day EXAM: PORTABLE CHEST 1 VIEW COMPARISON:  February 25, 2017 FINDINGS: There is mild atelectasis in the left base, stable. There is no edema or consolidation. There is cardiomegaly with pulmonary vascularity within normal limits. No adenopathy. There is atherosclerotic calcification aorta. No bone lesions. IMPRESSION: Stable cardiomegaly. Aortic atherosclerosis. Atelectasis left base. No frank edema or consolidation. Electronically Signed   By: Lowella Grip III M.D.   On: 02/28/2017 08:58   Dg Chest Port 1 View  Result Date: 02/25/2017 CLINICAL DATA:  Found unresponsive this morning, mental status changes EXAM: PORTABLE CHEST 1 VIEW COMPARISON:  01/19/2017 FINDINGS: Cardiomegaly again noted. Central mild vascular congestion without convincing pulmonary edema. Elevation of the right hemidiaphragm again noted. Mild left basilar atelectasis. IMPRESSION: Central mild vascular congestion without convincing pulmonary edema. Elevation of the right hemidiaphragm again noted. Mild left basilar atelectasis. Electronically Signed   By: Lahoma Crocker M.D.   On:  02/25/2017 14:25    Discharge Exam: Vitals:   03/07/17 0300 03/07/17 0800  BP: (!) 84/45  105/68  Pulse: 81 88  Resp: (!) 21   Temp: 97.9 F (36.6 C) 97.5 F (36.4 C)   Vitals:   03/06/17 2000 03/06/17 2300 03/07/17 0300 03/07/17 0800  BP: 97/65 91/62 (!) 84/45 105/68  Pulse: (!) 109 81 81 88  Resp: (!) 23 (!) 25 (!) 21   Temp: 97.9 F (36.6 C) 98.1 F (36.7 C) 97.9 F (36.6 C) 97.5 F (36.4 C)  TempSrc: Oral Oral Oral Oral  SpO2: 93% 93% 93% 92%  Weight:      Height:        General: Pt is alert, follows commands appropriately, not in acute distress Cardiovascular: Regular rate and rhythm, no rubs, no gallops Respiratory: Clear to auscultation bilaterally, no wheezing, no crackles, no rhonchi Abdominal: Soft, non tender, non distended, bowel sounds +, no guarding  Discharge Instructions  Discharge Instructions    Diet - low sodium heart healthy    Complete by:  As directed    Increase activity slowly    Complete by:  As directed      Allergies as of 03/07/2017      Reactions   Acyclovir And Related Hives   Metformin And Related Diarrhea   Amoxicillin Hives, Other (See Comments)   Has patient had a PCN reaction causing immediate rash, facial/tongue/throat swelling, SOB or lightheadedness with hypotension: No Has patient had a PCN reaction causing severe rash involving mucus membranes or skin necrosis: No Has patient had a PCN reaction that required hospitalization No Has patient had a PCN reaction occurring within the last 10 years: No If all of the above answers are "NO", then may proceed with Cephalosporin use.   Augmentin [amoxicillin-pot Clavulanate] Hives, Other (See Comments)   Has patient had a PCN reaction causing immediate rash, facial/tongue/throat swelling, SOB or lightheadedness with hypotension: No Has patient had a PCN reaction causing severe rash involving mucus membranes or skin necrosis: No Has patient had a PCN reaction that required hospitalization No Has patient had a PCN reaction occurring within the last 10 years: No If all of the  above answers are "NO", then may proceed with Cephalosporin use.   Mavik [trandolapril] Other (See Comments)   Reaction:  Caused renal problems      Medication List    STOP taking these medications   lactulose 10 GM/15ML solution Commonly known as:  CHRONULAC   propranolol 40 MG tablet Commonly known as:  INDERAL   spironolactone 50 MG tablet Commonly known as:  ALDACTONE     TAKE these medications   allopurinol 300 MG tablet Commonly known as:  ZYLOPRIM Take 300 mg by mouth daily.   amiodarone 200 MG tablet Commonly known as:  PACERONE Take 1 tablet (200 mg total) by mouth 2 (two) times daily.   calcitRIOL 0.5 MCG capsule Commonly known as:  ROCALTROL Take 0.5 mcg by mouth daily.   febuxostat 40 MG tablet Commonly known as:  ULORIC Take 1 tablet (40 mg total) by mouth daily.   Insulin Glargine 100 UNIT/ML Solostar Pen Commonly known as:  LANTUS SOLOSTAR Inject 10 Units into the skin every morning. Please call your provider if glucose levels persistently  < 70 What changed:  how much to take  additional instructions   metoprolol tartrate 25 MG tablet Commonly known as:  LOPRESSOR Take 1 tablet (25 mg total) by mouth 2 (two) times daily.  pantoprazole 40 MG tablet Commonly known as:  PROTONIX Take 1 tablet (40 mg total) by mouth 2 (two) times daily before a meal.   torsemide 20 MG tablet Commonly known as:  DEMADEX 2 each morning and one in afternoon What changed:  how much to take  how to take this  when to take this  additional instructions   warfarin 10 MG tablet Commonly known as:  COUMADIN Take 5 mg by mouth See admin instructions. Pt takes every evening on Sunday, Tuesday, Wednesday, Thursday, and Saturday.       Follow-up Information    [facility] Follow up on 03/13/2017.   Specialty:  Cardiology Why:  at 1200 pm for post hospital follow up.  Please bring all of your medications. The code for  parking is 6000. Enter through [employer] off of [address], underground parking on right.  Can also park in lower ED lot and enter blue awning.  Contact information: [address] [id number] [place], [place]. Schedule an appointment as soon as possible for a visit in 1 week(s).   Specialty:  Family Medicine Contact information: [place] [address] [phone]        Faye Ramsay, MD Follow up.   Specialty:  Internal Medicine Why:  please call me with questions [phone] Contact information: [address] [place] [address] [phone]            The results of significant diagnostics from this hospitalization (including imaging, microbiology, ancillary and laboratory) are listed below for reference.     Microbiology: Recent Results (from the past 240 hour(s))  MRSA PCR Screening     Status: Abnormal   Collection Time: 02/26/17  5:20 AM  Result Value Ref Range Status   MRSA by PCR INVALID RESULTS, SPECIMEN SENT FOR CULTURE (A) NEGATIVE Final    Comment: RESULT CALLED TO, READ BACK BY AND VERIFIED WITH: PHILLIPS C. AT 0758A ON [id number] BY THOMPSON S.   MRSA culture     Status: None   Collection Time: 02/26/17  5:20 AM  Result Value Ref Range Status   Specimen Description NOSE  Final   Special Requests NONE  Final   Culture   Final    NO MRSA DETECTED Performed at [facility], [address], [place], [place] [address]    Report Status 02/28/2017 FINAL  Final  MRSA PCR Screening     Status: None   Collection Time: 02/28/17  6:57 AM  Result Value Ref Range Status   MRSA by PCR NEGATIVE NEGATIVE Final    Comment:        The GeneXpert MRSA Assay (FDA approved for NASAL specimens only), is one component of a comprehensive MRSA colonization surveillance program. It is not intended to diagnose MRSA infection nor to guide or monitor treatment  for MRSA infections.      Labs: Basic Metabolic Panel:  Recent Labs Lab 03/03/17 1128 03/04/17 0221 03/05/17 0217 03/06/17 0249 03/07/17 0302  NA 131* 131* 133* 133* 133*  K 4.5 4.2 4.2 4.2 4.0  CL 95* 96* 97* 96* 96*  CO2 26 25 27 26 26   GLUCOSE 124* 101* 126* 87 105*  BUN 40* 42* 45* 50* 52*  CREATININE 1.48* 1.44* 1.64* 1.71* 1.79*  CALCIUM 8.5* 8.4* 8.3* 8.7* 8.7*    Recent Labs Lab 03/01/17 1021 03/02/17 0801 03/04/17 0221  AMMONIA 25 24 35   CBC:  Recent Labs Lab 03/03/17 0249 03/04/17 0221 03/05/17 0217 03/06/17 0249 03/07/17 0302  WBC 8.2 7.5 7.1 7.6 7.6  HGB 12.0* 11.8* 11.6* 11.6* 11.5*  HCT 34.6* 33.9* 33.3* 33.9* 32.8*  MCV 99.1 98.5 98.2 97.4 97.3  PLT 103* 112* 114* 119* 129*   CBG:  Recent Labs Lab 03/06/17 0822 03/06/17 1142 03/06/17 1616 03/06/17 2130 03/07/17 0819  GLUCAP 80 95 137* 129* 89   SIGNED: Time coordinating discharge:  30 minutes  MAGICK-Marquisa Salih, MD  [facility] 03/07/2017, 11:11 AM Pager [phone]  If 7PM-7AM, please contact night-coverage [email or web address] Password [email or web address]

## 2017-03-07 NOTE — Progress Notes (Signed)
Advanced Heart Failure Rounding Note   Subjective:    IV amio started 3/24 for AF with RVR with rates up to 160.   Remains on amio gtt. Remains in Afib but rates in 80s.   Feels great. Wants to go home.  RHC site still bleeding. No SOB, CP, lightheadedness, or dizziness.   + 300 cc yesterday. No weight this am.   INR 2.1.   Cath 03/04/17 with no CAD, relatively preserved cardiac output, and end stage RV failure. Full results as below.   VQ Scan 03/05/17 Matching ventilation, perfusion and radiographic abnormalities of the LEFT lower lobe. - By PIOPED II criteria, findings represent an intermediate probably for PE.   [facility] 03/04/17 Coronary Angiography : Normal coronary arteries Right Hemodynamics Ao = 90/56 (70) LV = 91/20 RA = 21 RV = 32/17 PA = 38/22 (29) PCW = 20 Fick cardiac output/index = 5.7/2.5 PVR = 1.5 WU Ao sat = 96% PA sat = 63%, 63% SVC sat = 63% SVR = 716 End stage RV failure with PAPI = 0.76  Objective:   Weight Range:  Vital Signs:   Temp:  [97.5 F (36.4 C)-98.1 F (36.7 C)] 97.5 F (36.4 C) (03/30 0800) Pulse Rate:  [75-109] 88 (03/30 0800) Resp:  [21-25] 21 (03/30 0300) BP: (84-105)/(45-72) 105/68 (03/30 0800) SpO2:  [92 %-98 %] 92 % (03/30 0800) Last BM Date: 03/07/17  Weight change: Filed Weights   02/27/17 0800 03/04/17 0637 03/06/17 0430  Weight: 219 lb 12.8 oz (99.7 kg) 226 lb 6.4 oz (102.7 kg) 223 lb 9.6 oz (101.4 kg)    Intake/Output:   Intake/Output Summary (Last 24 hours) at 03/07/17 0945 Last data filed at 03/07/17 0800  Gross per 24 hour  Intake              456 ml  Output                0 ml  Net              456 ml     Physical Exam: General: Seated in recliner. Wife at side   HEENT: Normal anicteric. Poor dentition  Neck: Supple. JVP 7-8 cm with prominent CV waves. Carotids 2+ bilat; no bruits. No thyromegaly or nodule noted.   Cor: PMI nondisplaced.  IRR, IRR  2/6 TR. + RV lift  Lungs: clear no  wheeze Abdomen: soft, NT, ND, no HSM. No bruits or masses. +BS   Extremities: no cyanosis, clubbing, rash. Trace ankle edema. Chronic venous stasis changes to bilateral LEs. Left AC site continues to ooze Neuro: Alert & oriented x 3. Cranial nerves grossly intact. Moves all 4 extremities w/o difficulty. Affect pleasant  No focal deificits  No asterixis   Telemetry: AF 80s. Personally reviewed     Labs: Basic Metabolic Panel:  Recent Labs Lab 03/03/17 1128 03/04/17 0221 03/05/17 0217 03/06/17 0249 03/07/17 0302  NA 131* 131* 133* 133* 133*  K 4.5 4.2 4.2 4.2 4.0  CL 95* 96* 97* 96* 96*  CO2 26 25 27 26 26   GLUCOSE 124* 101* 126* 87 105*  BUN 40* 42* 45* 50* 52*  CREATININE 1.48* 1.44* 1.64* 1.71* 1.79*  CALCIUM 8.5* 8.4* 8.3* 8.7* 8.7*    Liver Function Tests:  Recent Labs Lab 02/28/17 1110  AST 28  ALT 26  ALKPHOS 208*  BILITOT 5.0*  PROT 7.8  ALBUMIN 2.8*   No results for input(s): LIPASE, AMYLASE in the last 168 hours.  Recent Labs Lab 03/01/17 1021 03/02/17 0801 03/04/17 0221  AMMONIA 25 24 35    CBC:  Recent Labs Lab 03/03/17 0249 03/04/17 0221 03/05/17 0217 03/06/17 0249 03/07/17 0302  WBC 8.2 7.5 7.1 7.6 7.6  HGB 12.0* 11.8* 11.6* 11.6* 11.5*  HCT 34.6* 33.9* 33.3* 33.9* 32.8*  MCV 99.1 98.5 98.2 97.4 97.3  PLT 103* 112* 114* 119* 129*    Cardiac Enzymes: No results for input(s): CKTOTAL, CKMB, CKMBINDEX, TROPONINI in the last 168 hours.  BNP: BNP (last 3 results) No results for input(s): BNP in the last 8760 hours.  ProBNP (last 3 results) No results for input(s): PROBNP in the last 8760 hours.    Other results:  Imaging: Dg Chest 2 View  Result Date: 03/05/2017 CLINICAL DATA:  Shortness of breath. EXAM: CHEST  2 VIEW COMPARISON:  03/01/2017. FINDINGS: Stable cardiomegaly with normal pulmonary vascularity. Stable left base pleuroparenchymal thickening consistent with scarring. Bilateral pleural effusions. No pneumothorax. No  acute bony abnormality. IMPRESSION: 1. Stable left base atelectatic changes and/or pleural-parenchymal scarring. 2.  Stable cardiomegaly.  Bilateral pleural effusions. Electronically Signed   By: Marcello Moores  Register   On: 03/05/2017 13:12   Nm Pulmonary Perf And Vent  Result Date: 03/05/2017 CLINICAL DATA:  Chest pain and shortness of breath associated with episode of hyperglycemia, history cirrhosis, stage III chronic kidney disease, permanent atrial fibrillation, type II diabetes mellitus EXAM: NUCLEAR MEDICINE VENTILATION - PERFUSION LUNG SCAN TECHNIQUE: Ventilation images were obtained in multiple projections using inhaled aerosol Tc-87m DTPA. Perfusion images were obtained in multiple projections after intravenous injection of Tc-19m MAA. RADIOPHARMACEUTICALS:  31.0 mCi Technetium-75m DTPA aerosol inhalation and 4.2 mCi Technetium-70m MAA IV COMPARISON:  None Correlation: Chest radiograph 03/05/2017 FINDINGS: Ventilation: Enlargement of cardiac silhouette. Diminished ventilation LEFT lower lobe. Remaining ventilation normal. Perfusion: Matching diminished perfusion LEFT lower lobe. Enlargement of cardiac silhouette. Remaining perfusion normal. Chest radiograph: Persistent LEFT lower lobe atelectasis versus consolidation. IMPRESSION: Matching ventilation, perfusion and radiographic abnormalities of the LEFT lower lobe. By PIOPED II criteria, findings represent an intermediate probability for pulmonary embolism. Electronically Signed   By: Lavonia Dana M.D.   On: 03/05/2017 13:06     Medications:     Scheduled Medications: . amiodarone  400 mg Oral BID  . calcitRIOL  0.5 mcg Oral Daily  . febuxostat  40 mg Oral Daily  . lactulose  20 g Oral Daily  . metoprolol tartrate  25 mg Oral BID  . pantoprazole  40 mg Oral BID AC  . sodium chloride flush  3 mL Intravenous Q12H  . torsemide  40 mg Oral BID  . Warfarin - Pharmacist Dosing Inpatient   Does not apply q1800    Infusions:   PRN  Medications: sodium chloride, acetaminophen, ipratropium, levalbuterol, ondansetron **OR** ondansetron (ZOFRAN) IV, ondansetron (ZOFRAN) IV, promethazine, sodium chloride flush, technetium TC 56M diethylenetriame-pentaacetic acid   Assessment:   1. Acute on chronic diastolic CHF 2. Hypoglycemia 3. Unstable angina 4. Chronic Atrial fibrillation now with RVR 5. Hyponatremia/hyperkalemia 6. Cirrhosis of the liver with ascites 7. Type 2 diabetes mellitus  8. Acute on CKD stage III 9. Hyperkalemia  Plan/Discussion:    Decrease amiodarone 200 mg BID for home.   Stop heparin. INR 2.1.  PCP has been managing coumadin.   Cath 03/05/17 with normal coronaries and end stage RV failure. Cardiac output OK.   Volume status looks good. Creatinine with mild up trend.  Will decrease torsemide to 40 mg q am and 20 mg  q pm and follow.    RHC site continues to ooze. Dr. Haroldine Laws to place stitch.  Has been refusing lactulose.  Remains clear. No need for use at home at this time.   VQ Scan 03/05/17 Matching ventilation, perfusion and radiographic abnormalities of the LEFT lower lobe. - By PIOPED II criteria, findings represent an intermediate probably for PE.   Will need sleep study as outpatient.   OK for home today. Will need HHPT.   HF meds for home Amiodarone 200 mg BID Lopressor 25 mg BID Torsemide 40 mg q am and 20 mg q pm.   Length of Stay: [place], PA-C  03/07/2017, 9:45 AM  Advanced Heart Failure Team Pager [phone] (M-F; 7a - 4p)  Please contact [facility] for night-coverage after hours (4p -7a ) and weekends on [email or web address]  Patient seen and examined with the above-signed Advanced Practice Provider and/or Housestaff. I personally reviewed laboratory data, imaging studies and relevant notes. I independently examined the patient and formulated the important aspects of the plan. I have edited the note to reflect any of my changes or salient points. I have  personally discussed the plan with the patient and/or family.  He is much improved. Ready for d/c. We reviewed results of testing including cath and VQ scan.   I placed a stitch in right AC site and bleeding resolved.   Agree with above d/c meds. Will follow in HF clinic. Continue coumadin,   Glori Bickers, MD  9:35 PM

## 2017-03-07 NOTE — Care Management (Signed)
Case manager faxed order and demographics for sleep study to [phone]. Office is closed today. CM also left voice message for office staff concerning scheduling patient for sleep study.

## 2017-03-07 NOTE — Discharge Instructions (Signed)
Please note changes in medications: 1. Stop Labetalol 2. Dose of Lantus was decreased to 10 Units daily but please check your sugar levels every day and report levels < 70 to your provider  3. Stop taking Spironolactone until you see your provider    Pulmonary Embolism A pulmonary embolism (PE) is a sudden blockage or decrease of blood flow in one lung or both lungs. Most blockages come from a blood clot that travels from the legs or the pelvis to the lungs. PE is a dangerous and potentially life-threatening condition if it is not treated right away. What are the causes? A pulmonary embolism occurs most commonly when a blood clot travels from one of your veins to your lungs. Rarely, PE is caused by air, fat, amniotic fluid, or part of a tumor traveling through your veins to your lungs. What increases the risk? A PE is more likely to develop in:  People who smoke.  People who areolder, especially over 32 years of age.  People who are overweight (obese).  People who sit or lie still for a long time, such as during long-distance travel (over 4 hours), bed rest, hospitalization, or during recovery from certain medical conditions like a stroke.  People who do not engage in much physical activity (sedentary lifestyle).  People who have chronic breathing disorders.  People whohave a personal or family history of blood clots or blood clotting disease.  People whohave peripheral vascular disease (PVD), diabetes, or some types of cancer.  People who haveheart disease, especially if the person had a recent heart attack or has congestive heart failure.  People who have neurological diseases that affect the legs (leg paresis).  People who have had a traumatic injury, such as breaking a hip or leg.  People whohave recently had major or lengthy surgery, especially on the hip, knee, or abdomen.  People who have hada central line placed inside a large vein.  People who takemedicines  that contain the hormone estrogen. These include birth control pills and hormone replacement therapy.  Pregnancy or during childbirth or the postpartum period. What are the signs or symptoms? The symptoms of a PE usually start suddenly and include:  Shortness of breath while active or at rest.  Coughing or coughing up blood or blood-tinged mucus.  Chest pain that is often worse with deep breaths.  Rapid or irregular heartbeat.  Feeling light-headed or dizzy.  Fainting.  Feelinganxious.  Sweating. There may also be pain and swelling in a leg if that is where the blood clot started. These symptoms may represent a serious problem that is an emergency. Do not wait to see if the symptoms will go away. Get medical help right away. Call your local emergency services (911 in the [place] Do not drive yourself to the hospital.  How is this diagnosed? Your health care provider will take a medical history and perform a physical exam. You may also have other tests, including:  Blood tests to assess the clotting properties of your blood, assess oxygen levels in your blood, and find blood clots.  Imaging tests, such as CT, ultrasound, MRI, X-ray, and other tests to see if you have clots anywhere in your body.  An electrocardiogram (ECG) to look for heart strain from blood clots in the lungs. How is this treated? The main goals of PE treatment are:  To stop a blood clot from growing larger.  To stop new blood clots from forming. The type of treatment that you receive depends on  many factors, such as the cause of your PE, your risk for bleeding or developing more clots, and other medical conditions that you have. Sometimes, a combination of treatments is necessary. This condition may be treated with:  Medicines, including newer oral blood thinners (anticoagulants), warfarin, low molecular weight heparins, thrombolytics, or heparins.  Wearing compression stockings or using different types of  devices.  Surgery (rare) to remove the blood clot or to place a filter in your abdomen to stop the blood clot from traveling to your lungs. Treatments for a PE are often divided into immediate treatment, long-term treatment (up to 3 months after PE), and extended treatment (more than 3 months after PE). Your treatment may continue for several months. This is called maintenance therapy, and it is used to prevent the forming of new blood clots. You can work with your health care provider to choose the treatment program that is best for you. What are anticoagulants?  Anticoagulants are medicines that treat PEs. They can stop current blood clots from growing and stop new clots from forming. They cannot dissolve existing clots. Your body dissolves clots by itself over time. Anticoagulants are given by mouth, by injection, or through an IV tube. What are thrombolytics?  Thrombolytics are clot-dissolving medicines that are used to dissolve a PE. They carry a high risk of bleeding, so they tend to be used only in severe cases or if you have very low blood pressure. Follow these instructions at home: If you are taking a newer oral anticoagulant:   Take the medicine every single day at the same time each day.  Understand what foods and drugs interact with this medicine.  Understand that there are no regular blood tests required when using this medicine.  Understandthe side effects of this medicine, including excessive bruising or bleeding. Ask your health care provider or pharmacist about other possible side effects. If you are taking warfarin:   Understand how to take warfarin and know which foods can affect how warfarin works in Veterinary surgeon.  Understand that it is dangerous to taketoo much or too little warfarin. Too much warfarin increases the risk of bleeding. Too little warfarin continues to allow the risk for blood clots.  Follow your PT and INR blood testing schedule. The PT and INR results  allow your health care provider to adjust your dose of warfarin. It is very important that you have your PT and INR tested as often as told by your health care provider.  Avoid major changes in your diet, or tell your health care provider before you change your diet. Arrange a visit with a registered dietitian to answer your questions. Many foods, especially foods that are high in vitamin K, can interfere with warfarin and affect the PT and INR results. Eat a consistent amount of foods that are high in vitamin K, such as:  Spinach, kale, broccoli, cabbage, collard greens, turnip greens, Brussels sprouts, peas, cauliflower, seaweed, and parsley.  Beef liver and pork liver.  Green tea.  Soybean oil.  Tell your health care provider about any and all medicines, vitamins, and supplements that you take, including aspirin and other over-the-counter anti-inflammatory medicines. Be especially cautious with aspirin and anti-inflammatory medicines. Do not take those before you ask your health care provider if it is safe to do so. This is important because many medicines can interfere with warfarin and affect the PT and INR results.  Do not start or stop taking any over-the-counter or prescription medicine unless your health  care provider or pharmacist tells you to do so. If you take warfarin, you will also need to do these things:  Hold pressure over cuts for longer than usual.  Tell your dentist and other health care providers that you are taking warfarin before you have any procedures in which bleeding may occur.  Avoid alcohol or drink very small amounts. Tell your health care provider if you change your alcohol intake.  Do not use tobacco products, including cigarettes, chewing tobacco, and e-cigarettes. If you need help quitting, ask your health care provider.  Avoid contact sports. General instructions   Take over-the-counter and prescription medicines only as told by your health care  provider. Anticoagulant medicines can have side effects, including easy bruising and difficulty stopping bleeding. If you are prescribed an anticoagulant, you will also need to do these things:  Hold pressure over cuts for longer than usual.  Tell your dentist and other health care providers that you are taking anticoagulants before you have any procedures in which bleeding may occur.  Avoid contact sports.  Wear a medical alert bracelet or carry a medical alert card that says you have had a PE.  Ask your health care provider how soon you can go back to your normal activities. Stay active to prevent new blood clots from forming.  Make sure to exercise while traveling or when you have been sitting or standing for a long period of time. It is very important to exercise. Exercise your legs by walking or by tightening and relaxing your leg muscles often. Take frequent walks.  Wear compression stockings as told by your health care provider to help prevent more blood clots from forming.  Do not use tobacco products, including cigarettes, chewing tobacco, and e-cigarettes. If you need help quitting, ask your health care provider.  Keep all follow-up appointments with your health care provider. This is important. How is this prevented? Take these actions to decrease your risk of developing another PE:  Exercise regularly. For at least 30 minutes every day, engage in:  Activity that involves moving your arms and legs.  Activity that encourages good blood flow through your body by increasing your heart rate.  Exercise your arms and legs every hour during long-distance travel (over 4 hours). Drink plenty of water and avoid drinking alcohol while traveling.  Avoid sitting or lying in bed for long periods of time without moving your legs.  Maintain a weight that is appropriate for your height. Ask your health care provider what weight is healthy for you.  If you are a woman who is over 104 years  of age, avoid unnecessary use of medicines that contain estrogen. These include birth control pills.  Do not smoke, especially if you take estrogen medicines. If you need help quitting, ask your health care provider.  If you are at very high risk for PE, wear compression stockings.  If you recently had a PE, have regularly scheduled ultrasound testing on your legs to check for new blood clots. If you are hospitalized, prevention measures may include:  Early walking after surgery, as soon as your health care provider says that it is safe.  Receiving anticoagulants to prevent blood clots. If you cannot take anticoagulants, other options may be available, such as wearing compression stockings or using different types of devices. Get help right away if:  You have new or increased pain, swelling, or redness in an arm or leg.  You have numbness or tingling in an arm or leg.  You have shortness of breath while active or at rest.  You have chest pain.  You have a rapid or irregular heartbeat.  You feel light-headed or dizzy.  You cough up blood.  You notice blood in your vomit, bowel movement, or urine.  You have a fever. These symptoms may represent a serious problem that is an emergency. Do not wait to see if the symptoms will go away. Get medical help right away. Call your local emergency services (911 in the [place] Do not drive yourself to the hospital. This information is not intended to replace advice given to you by your health care provider. Make sure you discuss any questions you have with your health care provider. Document Released: 11/22/2000 Document Revised: 05/02/2016 Document Reviewed: 03/22/2015 Elsevier Interactive Patient Education  2017 [employer].

## 2017-03-07 NOTE — Progress Notes (Signed)
Patient discharged to home with wife. Received discharged instructions. Prescriptions sent to patient's pharmacy. Patient stable upon discharge.

## 2017-03-10 DIAGNOSIS — N183 Chronic kidney disease, stage 3 (moderate): Secondary | ICD-10-CM | POA: Diagnosis not present

## 2017-03-10 DIAGNOSIS — I5033 Acute on chronic diastolic (congestive) heart failure: Secondary | ICD-10-CM | POA: Diagnosis not present

## 2017-03-10 DIAGNOSIS — Z794 Long term (current) use of insulin: Secondary | ICD-10-CM | POA: Diagnosis not present

## 2017-03-10 DIAGNOSIS — E1122 Type 2 diabetes mellitus with diabetic chronic kidney disease: Secondary | ICD-10-CM | POA: Diagnosis not present

## 2017-03-10 DIAGNOSIS — Z7901 Long term (current) use of anticoagulants: Secondary | ICD-10-CM | POA: Diagnosis not present

## 2017-03-10 DIAGNOSIS — M109 Gout, unspecified: Secondary | ICD-10-CM | POA: Diagnosis not present

## 2017-03-10 DIAGNOSIS — I482 Chronic atrial fibrillation: Secondary | ICD-10-CM | POA: Diagnosis not present

## 2017-03-10 DIAGNOSIS — Z87891 Personal history of nicotine dependence: Secondary | ICD-10-CM | POA: Diagnosis not present

## 2017-03-10 DIAGNOSIS — I130 Hypertensive heart and chronic kidney disease with heart failure and stage 1 through stage 4 chronic kidney disease, or unspecified chronic kidney disease: Secondary | ICD-10-CM | POA: Diagnosis not present

## 2017-03-11 ENCOUNTER — Ambulatory Visit (INDEPENDENT_AMBULATORY_CARE_PROVIDER_SITE_OTHER): Payer: Medicare Other | Admitting: Family Medicine

## 2017-03-11 ENCOUNTER — Encounter: Payer: Self-pay | Admitting: Family Medicine

## 2017-03-11 VITALS — BP 72/47 | HR 62 | Temp 97.8°F | Ht 73.0 in

## 2017-03-11 DIAGNOSIS — R188 Other ascites: Secondary | ICD-10-CM

## 2017-03-11 DIAGNOSIS — E1121 Type 2 diabetes mellitus with diabetic nephropathy: Secondary | ICD-10-CM | POA: Diagnosis not present

## 2017-03-11 DIAGNOSIS — K7460 Unspecified cirrhosis of liver: Secondary | ICD-10-CM | POA: Diagnosis not present

## 2017-03-11 DIAGNOSIS — Z794 Long term (current) use of insulin: Secondary | ICD-10-CM | POA: Diagnosis not present

## 2017-03-11 DIAGNOSIS — E162 Hypoglycemia, unspecified: Secondary | ICD-10-CM | POA: Diagnosis not present

## 2017-03-11 DIAGNOSIS — I482 Chronic atrial fibrillation: Secondary | ICD-10-CM | POA: Diagnosis not present

## 2017-03-11 DIAGNOSIS — E7220 Disorder of urea cycle metabolism, unspecified: Secondary | ICD-10-CM | POA: Diagnosis not present

## 2017-03-11 DIAGNOSIS — E871 Hypo-osmolality and hyponatremia: Secondary | ICD-10-CM | POA: Diagnosis not present

## 2017-03-11 LAB — COAGUCHEK XS/INR WAIVED
INR: 2.5 — AB (ref 0.9–1.1)
Prothrombin Time: 29.7 s

## 2017-03-11 NOTE — Progress Notes (Signed)
Subjective:  Patient ID: Melvin Wang, male    DOB: 12-Sep-1941  Age: 76 y.o. MRN: [id number]  CC: Hospitalization Follow-up (pt here today following up after going to ED by ambulance after wife found him unconscious. )   HPI AARIN BLUETT presents for had a low glucose. Found to be in CHF per hosp. Report. Sx have resoved. Glucose log showsfsasting in 100-120 range since DC 3 days ago. Multiple readings trough the day 140 -200. No new episodes of hypoglycemia.    History Kace has a past medical history of Abnormality of gait; Cataract; Cirrhosis of liver with ascites ([place]) (01/19/2017); CKD (chronic kidney disease), stage III; Cor pulmonale ([place]); Essential hypertension; Essential tremor; Foot ulcer, right ([place]) (03/2013); Gout; Hyperlipidemia; Permanent atrial fibrillation ([place]); and Type 2 diabetes mellitus ([place]).   He has a past surgical history that includes Cataract extraction w/PHACO (Left, 04/19/2013); Cataract extraction w/PHACO (Right, 07/15/2013); Esophagogastroduodenoscopy (N/A, 01/21/2017); and Right/Left Heart Cath and Coronary Angiography (N/A, 03/04/2017).   His family history includes Cancer in his paternal uncle; Colon cancer (age of onset: 73) in his father.He reports that he quit smoking about 32 years ago. His smoking use included Cigarettes. He has a 15.00 pack-year smoking history. He has never used smokeless tobacco. He reports that he does not drink alcohol or use drugs.    ROS Review of Systems  Constitutional: Negative for chills, diaphoresis and fever.  HENT: Negative for rhinorrhea and sore throat.   Respiratory: Negative for cough and shortness of breath.   Cardiovascular: Negative for chest pain.  Gastrointestinal: Negative for abdominal pain.  Musculoskeletal: Negative for arthralgias and myalgias.  Skin: Negative for rash.    Objective:  BP (!) 72/47   Pulse 62   Temp 97.8 F (36.6 C) (Oral)   Ht 6' 1"  (1.854 m)   SpO2 96%   BP Readings  from Last 3 Encounters:  03/11/17 (!) 72/47  03/07/17 101/73  02/21/17 (!) 84/58    Wt Readings from Last 3 Encounters:  03/06/17 223 lb 9.6 oz (101.4 kg)  02/21/17 219 lb (99.3 kg)  02/14/17 220 lb (99.8 kg)     Physical Exam  Constitutional: He is oriented to person, place, and time. He appears well-developed and well-nourished.  HENT:  Head: Normocephalic and atraumatic.  Right Ear: External ear normal.  Left Ear: External ear normal.  Mouth/Throat: No oropharyngeal exudate or posterior oropharyngeal erythema.  Eyes: Pupils are equal, round, and reactive to light.  Neck: Normal range of motion. Neck supple.  Cardiovascular: Normal rate and regular rhythm.   No murmur heard. Pulmonary/Chest: Breath sounds normal. No respiratory distress.  Neurological: He is alert and oriented to person, place, and time.  Vitals reviewed.     Assessment & Plan:   Avinash was seen today for hospitalization follow-up.  Diagnoses and all orders for this visit:  Controlled type 2 diabetes mellitus with diabetic nephropathy, with long-term current use of insulin ([place]) -     CBC with Differential/Platelet -     CMP14+EGFR  Cirrhosis of liver with ascites, unspecified hepatic cirrhosis type ([facility]) -     Ammonia -     CBC with Differential/Platelet -     CMP14+EGFR -     CoaguChek XS/INR Waived  Hypoglycemia -     CBC with Differential/Platelet -     CMP14+EGFR  Hyponatremia -     CBC with Differential/Platelet -     CMP14+EGFR  Hyperammonemia ([facility]) -  Ammonia -     CBC with Differential/Platelet -     CMP14+EGFR       I am having Mr. Petrosian maintain his torsemide, febuxostat, allopurinol, calcitRIOL, warfarin, amiodarone, metoprolol tartrate, Insulin Glargine, pantoprazole, and ONE TOUCH ULTRA TEST.  Allergies as of 03/11/2017      Reactions   Acyclovir And Related Hives   Metformin And Related Diarrhea   Amoxicillin Hives, Other (See Comments)   Has patient had a  PCN reaction causing immediate rash, facial/tongue/throat swelling, SOB or lightheadedness with hypotension: No Has patient had a PCN reaction causing severe rash involving mucus membranes or skin necrosis: No Has patient had a PCN reaction that required hospitalization No Has patient had a PCN reaction occurring within the last 10 years: No If all of the above answers are "NO", then may proceed with Cephalosporin use.   Augmentin [amoxicillin-pot Clavulanate] Hives, Other (See Comments)   Has patient had a PCN reaction causing immediate rash, facial/tongue/throat swelling, SOB or lightheadedness with hypotension: No Has patient had a PCN reaction causing severe rash involving mucus membranes or skin necrosis: No Has patient had a PCN reaction that required hospitalization No Has patient had a PCN reaction occurring within the last 10 years: No If all of the above answers are "NO", then may proceed with Cephalosporin use.   Mavik [trandolapril] Other (See Comments)   Reaction:  Caused renal problems      Medication List       Accurate as of 03/11/17 10:18 PM. Always use your most recent med list.          allopurinol 300 MG tablet Commonly known as:  ZYLOPRIM Take 300 mg by mouth daily.   amiodarone 200 MG tablet Commonly known as:  PACERONE Take 1 tablet (200 mg total) by mouth 2 (two) times daily.   calcitRIOL 0.5 MCG capsule Commonly known as:  ROCALTROL Take 0.5 mcg by mouth daily.   febuxostat 40 MG tablet Commonly known as:  ULORIC Take 1 tablet (40 mg total) by mouth daily.   Insulin Glargine 100 UNIT/ML Solostar Pen Commonly known as:  LANTUS SOLOSTAR Inject 10 Units into the skin every morning. Please call your provider if glucose levels persistently  < 70   metoprolol tartrate 25 MG tablet Commonly known as:  LOPRESSOR Take 1 tablet (25 mg total) by mouth 2 (two) times daily.   ONE TOUCH ULTRA TEST test strip Generic drug:  glucose blood   pantoprazole 40 MG  tablet Commonly known as:  PROTONIX TAKE 1 TABLET (40 MG TOTAL) BY MOUTH 2 (TWO) TIMES DAILY BEFORE A MEAL.   torsemide 20 MG tablet Commonly known as:  DEMADEX 2 each morning and one in afternoon   warfarin 10 MG tablet Commonly known as:  COUMADIN Take 5 mg by mouth See admin instructions. Pt takes every evening on Sunday, Tuesday, Wednesday, Thursday, and Saturday.        Follow-up: Return in about 1 week (around 03/18/2017).  Claretta Fraise, M.D.

## 2017-03-12 ENCOUNTER — Other Ambulatory Visit: Payer: Self-pay | Admitting: Family Medicine

## 2017-03-12 DIAGNOSIS — K7460 Unspecified cirrhosis of liver: Secondary | ICD-10-CM

## 2017-03-12 DIAGNOSIS — N183 Chronic kidney disease, stage 3 (moderate): Secondary | ICD-10-CM | POA: Diagnosis not present

## 2017-03-12 DIAGNOSIS — Z87891 Personal history of nicotine dependence: Secondary | ICD-10-CM | POA: Diagnosis not present

## 2017-03-12 DIAGNOSIS — M109 Gout, unspecified: Secondary | ICD-10-CM | POA: Diagnosis not present

## 2017-03-12 DIAGNOSIS — I482 Chronic atrial fibrillation: Secondary | ICD-10-CM | POA: Diagnosis not present

## 2017-03-12 DIAGNOSIS — I5033 Acute on chronic diastolic (congestive) heart failure: Secondary | ICD-10-CM | POA: Diagnosis not present

## 2017-03-12 DIAGNOSIS — Z794 Long term (current) use of insulin: Secondary | ICD-10-CM | POA: Diagnosis not present

## 2017-03-12 DIAGNOSIS — I130 Hypertensive heart and chronic kidney disease with heart failure and stage 1 through stage 4 chronic kidney disease, or unspecified chronic kidney disease: Secondary | ICD-10-CM | POA: Diagnosis not present

## 2017-03-12 DIAGNOSIS — E1122 Type 2 diabetes mellitus with diabetic chronic kidney disease: Secondary | ICD-10-CM | POA: Diagnosis not present

## 2017-03-12 DIAGNOSIS — Z7901 Long term (current) use of anticoagulants: Secondary | ICD-10-CM | POA: Diagnosis not present

## 2017-03-12 DIAGNOSIS — R188 Other ascites: Principal | ICD-10-CM

## 2017-03-12 LAB — CBC WITH DIFFERENTIAL/PLATELET
BASOS ABS: 0 10*3/uL (ref 0.0–0.2)
Basos: 0 %
EOS (ABSOLUTE): 0.1 10*3/uL (ref 0.0–0.4)
Eos: 1 %
Hematocrit: 33.5 % — ABNORMAL LOW (ref 37.5–51.0)
Hemoglobin: 11.2 g/dL — ABNORMAL LOW (ref 13.0–17.7)
IMMATURE GRANS (ABS): 0 10*3/uL (ref 0.0–0.1)
IMMATURE GRANULOCYTES: 0 %
LYMPHS: 9 %
Lymphocytes Absolute: 0.9 10*3/uL (ref 0.7–3.1)
MCH: 34 pg — AB (ref 26.6–33.0)
MCHC: 33.4 g/dL (ref 31.5–35.7)
MCV: 102 fL — ABNORMAL HIGH (ref 79–97)
Monocytes Absolute: 1.4 10*3/uL — ABNORMAL HIGH (ref 0.1–0.9)
Monocytes: 13 %
NEUTROS PCT: 77 %
Neutrophils Absolute: 7.9 10*3/uL — ABNORMAL HIGH (ref 1.4–7.0)
PLATELETS: 224 10*3/uL (ref 150–379)
RBC: 3.29 x10E6/uL — AB (ref 4.14–5.80)
RDW: 16 % — AB (ref 12.3–15.4)
WBC: 10.4 10*3/uL (ref 3.4–10.8)

## 2017-03-12 LAB — CMP14+EGFR
ALBUMIN: 3.3 g/dL — AB (ref 3.5–4.8)
ALK PHOS: 225 IU/L — AB (ref 39–117)
ALT: 14 IU/L (ref 0–44)
AST: 18 IU/L (ref 0–40)
Albumin/Globulin Ratio: 0.9 — ABNORMAL LOW (ref 1.2–2.2)
BILIRUBIN TOTAL: 2.4 mg/dL — AB (ref 0.0–1.2)
BUN / CREAT RATIO: 36 — AB (ref 10–24)
BUN: 72 mg/dL — AB (ref 8–27)
CHLORIDE: 88 mmol/L — AB (ref 96–106)
CO2: 23 mmol/L (ref 18–29)
Calcium: 9.4 mg/dL (ref 8.6–10.2)
Creatinine, Ser: 2 mg/dL — ABNORMAL HIGH (ref 0.76–1.27)
GFR calc Af Amer: 37 mL/min/{1.73_m2} — ABNORMAL LOW (ref >59)
GFR calc non Af Amer: 32 mL/min/{1.73_m2} — ABNORMAL LOW (ref >59)
GLUCOSE: 99 mg/dL (ref 65–99)
Globulin, Total: 3.6 g/dL (ref 1.5–4.5)
Potassium: 5.2 mmol/L (ref 3.5–5.2)
Sodium: 130 mmol/L — ABNORMAL LOW (ref 134–144)
Total Protein: 6.9 g/dL (ref 6.0–8.5)

## 2017-03-12 LAB — AMMONIA: Ammonia: 124 ug/dL (ref 27–102)

## 2017-03-12 NOTE — Addendum Note (Signed)
Addended by: Shelbie Ammons on: 03/12/2017 03:20 PM   Modules accepted: Orders

## 2017-03-13 ENCOUNTER — Ambulatory Visit (HOSPITAL_COMMUNITY)
Admission: RE | Admit: 2017-03-13 | Discharge: 2017-03-13 | Disposition: A | Discharge status: Home / Self Care | Payer: Medicare Other | Source: Ambulatory Visit | Attending: Internal Medicine | Admitting: Internal Medicine

## 2017-03-13 ENCOUNTER — Other Ambulatory Visit: Payer: Self-pay | Admitting: Family Medicine

## 2017-03-13 ENCOUNTER — Other Ambulatory Visit: Payer: Self-pay

## 2017-03-13 VITALS — BP 90/66 | HR 105 | Wt 223.4 lb

## 2017-03-13 DIAGNOSIS — Z880 Allergy status to penicillin: Secondary | ICD-10-CM | POA: Insufficient documentation

## 2017-03-13 DIAGNOSIS — Z87891 Personal history of nicotine dependence: Secondary | ICD-10-CM | POA: Insufficient documentation

## 2017-03-13 DIAGNOSIS — Z794 Long term (current) use of insulin: Secondary | ICD-10-CM | POA: Diagnosis not present

## 2017-03-13 DIAGNOSIS — I509 Heart failure, unspecified: Secondary | ICD-10-CM

## 2017-03-13 DIAGNOSIS — I130 Hypertensive heart and chronic kidney disease with heart failure and stage 1 through stage 4 chronic kidney disease, or unspecified chronic kidney disease: Secondary | ICD-10-CM | POA: Insufficient documentation

## 2017-03-13 DIAGNOSIS — E1122 Type 2 diabetes mellitus with diabetic chronic kidney disease: Secondary | ICD-10-CM | POA: Insufficient documentation

## 2017-03-13 DIAGNOSIS — N183 Chronic kidney disease, stage 3 (moderate): Secondary | ICD-10-CM | POA: Diagnosis not present

## 2017-03-13 DIAGNOSIS — I517 Cardiomegaly: Secondary | ICD-10-CM | POA: Diagnosis not present

## 2017-03-13 DIAGNOSIS — I482 Chronic atrial fibrillation, unspecified: Secondary | ICD-10-CM

## 2017-03-13 DIAGNOSIS — R188 Other ascites: Secondary | ICD-10-CM | POA: Diagnosis not present

## 2017-03-13 DIAGNOSIS — I5032 Chronic diastolic (congestive) heart failure: Secondary | ICD-10-CM | POA: Diagnosis not present

## 2017-03-13 DIAGNOSIS — K7460 Unspecified cirrhosis of liver: Secondary | ICD-10-CM | POA: Diagnosis not present

## 2017-03-13 DIAGNOSIS — Z79899 Other long term (current) drug therapy: Secondary | ICD-10-CM | POA: Diagnosis not present

## 2017-03-13 DIAGNOSIS — Z7901 Long term (current) use of anticoagulants: Secondary | ICD-10-CM | POA: Insufficient documentation

## 2017-03-13 DIAGNOSIS — G250 Essential tremor: Secondary | ICD-10-CM | POA: Insufficient documentation

## 2017-03-13 DIAGNOSIS — I50810 Right heart failure, unspecified: Secondary | ICD-10-CM

## 2017-03-13 MED ORDER — LACTULOSE 20 GM/30ML PO SOLN: 30.0000 mL | Freq: Two times a day (BID) | ORAL | 6 refills | Status: AC

## 2017-03-13 MED ORDER — TORSEMIDE 20 MG PO TABS: 20.0000 mg | ORAL_TABLET | Freq: Two times a day (BID) | ORAL | 6 refills | Status: AC

## 2017-03-13 MED ORDER — PROPRANOLOL HCL 20 MG PO TABS: 20.0000 mg | ORAL_TABLET | Freq: Two times a day (BID) | ORAL | 6 refills | Status: AC

## 2017-03-13 NOTE — Progress Notes (Signed)
Advanced Heart Failure Clinic Note   Primary Care: Claretta Fraise, MD Primary HF: Dr. Haroldine Laws   HPI:  Melvin Wang is a 76 y.o. 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   Brought to [facility] ED 02/25/17 after being found unresponsive. Wife gave him CPR, though on EMS arrival CBG was 23. Given bag of D10. Pt had taken am insulin with minimal oral intake. Developed chest pain and rapid afib at [facility] and was transferred to [facility] for further work up. Started on IV amio for AF RVR with rates up to 160s with improvement.  Cath 03/04/17 with no CAD, relatively preserved cardiac output, and end stage RV failure. Full results as below. Diuresed and weaned off IV amio as tolerated.   Pt presents today for post hospital follow up. Feels a lot better. Main complaint is fatigue. Denies palpitations. Mildly SOB working with PT. O2 sats at home ~ 95%. Weight stable from discharge.  Feels lightheadedness with rapid standing. Urine output OK. Not particularly SOB with changing clothes or bathing, but does get tired. Feels as if his tremors are slightly worse. Denies N/V, diarrhea, fevers, chills, or near syncope.   Review of systems complete and found to be negative unless listed in HPI.    VQ Scan 03/05/17 Matching ventilation, perfusion and radiographic abnormalities of the LEFT lower lobe. - By PIOPED II criteria, findings represent an intermediate probably for PE.   [facility] 03/04/17 Coronary Angiography : Normal coronary arteries Right Hemodynamics Ao = 90/56 (70) LV = 91/20 RA = 21 RV = 32/17 PA = 38/22 (29) PCW = 20 Fick cardiac output/index = 5.7/2.5 PVR = 1.5 WU Ao sat = 96% PA sat = 63%, 63% SVC sat = 63% SVR = 716 End stage RV failure with PAPI = 0.76  Past Medical History:  Diagnosis Date  . Abnormality of gait   . Cataract   . Cirrhosis of liver with ascites ([place]) 01/19/2017    . CKD (chronic kidney disease), stage III    [facility]  . Cor pulmonale (HCC)    Possible  . Essential hypertension   . Essential tremor   . Foot ulcer, right ([place]) 03/2013  . Gout   . Hyperlipidemia   . Permanent atrial fibrillation (HCC)    a. >20 years, prior DCCVs unsuccessful, INR followed by PCP.  Marland Kitchen Type 2 diabetes mellitus ([place])     Current Outpatient Prescriptions  Medication Sig Dispense Refill  . allopurinol (ZYLOPRIM) 300 MG tablet Take 300 mg by mouth daily.    Marland Kitchen amiodarone (PACERONE) 200 MG tablet Take 1 tablet (200 mg total) by mouth 2 (two) times daily. 60 tablet 0  . B-D UF III MINI PEN NEEDLES 31G X 5 MM MISC USE 1 TIME DAILY WITH LANTUS SOLOSTAR 100 each 2  . calcitRIOL (ROCALTROL) 0.5 MCG capsule Take 0.5 mcg by mouth daily.    . febuxostat (ULORIC) 40 MG tablet Take 1 tablet (40 mg total) by mouth daily. 30 tablet 5  . Insulin Glargine (LANTUS SOLOSTAR) 100 UNIT/ML Solostar Pen Inject 10 Units into the skin every morning. Please call your provider if glucose levels persistently  < 70 15 mL 2  . metoprolol tartrate (LOPRESSOR) 25 MG tablet Take 1 tablet (25 mg total) by mouth 2 (two) times daily. 60 tablet 0  . ONE TOUCH ULTRA TEST test strip     .  pantoprazole (PROTONIX) 40 MG tablet TAKE 1 TABLET (40 MG TOTAL) BY MOUTH 2 (TWO) TIMES DAILY BEFORE A MEAL. 60 tablet 4  . torsemide (DEMADEX) 20 MG tablet 2 each morning and one in afternoon (Patient taking differently: Take 20-40 mg by mouth 2 (two) times daily. Pt takes two tablets in the morning and one in the evening.) 90 tablet 3  . warfarin (COUMADIN) 10 MG tablet Take 5 mg by mouth See admin instructions. Pt takes every evening on Sunday, Tuesday, Wednesday, Thursday, and Saturday.     No current facility-administered medications for this encounter.     Allergies  Allergen Reactions  . Acyclovir And Related Hives  . Metformin And Related Diarrhea  . Amoxicillin Hives and Other (See Comments)    Has  patient had a PCN reaction causing immediate rash, facial/tongue/throat swelling, SOB or lightheadedness with hypotension: No Has patient had a PCN reaction causing severe rash involving mucus membranes or skin necrosis: No Has patient had a PCN reaction that required hospitalization No Has patient had a PCN reaction occurring within the last 10 years: No If all of the above answers are "NO", then may proceed with Cephalosporin use.  . Augmentin [Amoxicillin-Pot Clavulanate] Hives and Other (See Comments)    Has patient had a PCN reaction causing immediate rash, facial/tongue/throat swelling, SOB or lightheadedness with hypotension: No Has patient had a PCN reaction causing severe rash involving mucus membranes or skin necrosis: No Has patient had a PCN reaction that required hospitalization No Has patient had a PCN reaction occurring within the last 10 years: No If all of the above answers are "NO", then may proceed with Cephalosporin use.  [name] [Trandolapril] Other (See Comments)    Reaction:  Caused renal problems    Social History   Social History  . Marital status: Married    Spouse name: N/A  . Number of children: N/A  . Years of education: N/A   Occupational History  . retired Dealer    Social History Main Topics  . Smoking status: Former Smoker    Packs/day: 1.00    Years: 15.00    Types: Cigarettes    Quit date: 07/07/1984  . Smokeless tobacco: Never Used     Comment: Smoked 20 years  . Alcohol use No     Comment: HISTORY OF HEAVY ETOH USE in 1966-1968, otherwise none   . Drug use: No  . Sexual activity: Yes    Birth control/ protection: None   Other Topics Concern  . Not on file   Social History Narrative  . No narrative on file   Family History  Problem Relation Age of Onset  . Colon cancer Father 1    Deceased age 34   . Cancer Paternal Uncle   . Liver disease Neg Hx    Vitals:   03/13/17 1223  BP: 90/66  Pulse: (!) 105  SpO2: 90%  Weight:  223 lb 6.4 oz (101.3 kg)   Wt Readings from Last 3 Encounters:  03/13/17 223 lb 6.4 oz (101.3 kg)  03/06/17 223 lb 9.6 oz (101.4 kg)  02/21/17 219 lb (99.3 kg)   PHYSICAL EXAM: General:  Elderly and chronically ill appearing caucasian gentlemen in NAD. Resting tremor.  HEENT: Normal.  Neck: Supple. JVP flat. Carotids 2+ bilat; no bruits. No thyromegaly or nodule noted.   Cor: PMI nondisplaced. Irr irr. 2/6 TR and + RV lift  Lungs: CTAB, normal effort Abdomen: soft, NT, ND, no HSM. No bruits  or masses. +BS  Extremities: No cyanosis, clubbing, rash, edema. Scattered ecchymosis BUEs Neuro: Alert & oriented x 3. Cranial nerves grossly intact. Moves all 4 extremities w/o difficulty. Affect pleasant. Resting tremor. ? Component of asterixis.  ECG: Reviewed personally, Afib 90 bpm, RBBB  ASSESSMENT & PLAN:  1. 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 - Volume status looks on dry side.  - With elevation in creatinine, will hold until Sunday, and then restart at torsemide 20 mg BID.  2. Chronic angina - No chest pain.  3. Chronic Atrial fibrillation now with RVR - Continue amiodarone 200 mg BID for now - No room to uptitrate BB with soft pressures - Coumadin per PCP. Denies bleeding.  4. Cirrhosis of the liver with ascites - Ammonia 124 03/11/17. - Restart Lactulose at 30 mL/20g BID.  5. Type 2 diabetes mellitus  - Per PCP.  6. CKD stage III - Creatinine 2 on 03/11/17. Adjusting diuretics as above.  7. RHC site bleeding - Stitch removed in clinic without complication. No further bleeding.  8. Suspected OSA - Will refer for sleep study once more stable.   Keep close follow up at 2 weeks for now.  Had labs with PCP several days ago. Will write paper script for CMET, Ammonia, and CBC repeat and upcoming PCP appointment next week.   Shirley Friar, PA-C 03/13/17   Patient seen and examined with the above-signed Advanced Practice Provider and/or Housestaff. I personally  reviewed laboratory data, imaging studies and relevant notes. I independently examined the patient and formulated the important aspects of the plan. I have edited the note to reflect any of my changes or salient points. I have personally discussed the plan with the patient and/or family.  Overall stable since d/c. On exam, looks a bit on the dry siide and creatinine worse so will cut back diuretics. AF well controlled on amio.He has severe essential tremor. Does not appear to be worsened by amio.  Continue warfarin. Will switch bblocker to propranolol given cirrhosis. Ammonia high. Restart lactulose.   Glori Bickers, MD  12:38 AM

## 2017-03-13 NOTE — Progress Notes (Signed)
Advanced Heart Failure Medication Review by a Pharmacist  Does the patient  feel that his/her medications are working for him/her?  yes  Has the patient been experiencing any side effects to the medications prescribed?  no  Does the patient measure his/her own blood pressure or blood glucose at home?  yes   Does the patient have any problems obtaining medications due to transportation or finances?   no  Understanding of regimen: good Understanding of indications: good Potential of compliance: good Patient understands to avoid NSAIDs. Patient understands to avoid decongestants.  Issues to address at subsequent visits: None    Pharmacist comments: Melvin Wang is a pleasant 76 yo M presenting with his wife and without a medication list but with good recall of his regimen. He reports good compliance with his regimen and did not have any specific medication-related questions or concerns for me at this time.   Ruta Hinds. Velva Harman, PharmD, BCPS, CPP Clinical Pharmacist Pager: [phone] Phone: [phone] 03/13/2017 12:41 PM      Time with patient: 10 minutes Preparation and documentation time: 2 minutes Total time: 12 minutes

## 2017-03-13 NOTE — Patient Instructions (Signed)
STOP Lopressor.  START Propranolol 20 mg twice daily.  START Lactulose 30 mls twice daily.  HOLD Torsemide today, tomorrow, and Saturday. Resume Sunday on REDUCED dose of 20 mg twice daily.  Follow up 2 weeks with Oda Kilts PA-C.  Do the following things EVERYDAY: 1) Weigh yourself in the morning before breakfast. Write it down and keep it in a log. 2) Take your medicines as prescribed 3) Eat low salt foods-Limit salt (sodium) to 2000 mg per day.  4) Stay as active as you can everyday 5) Limit all fluids for the day to less than 2 liters

## 2017-03-18 ENCOUNTER — Ambulatory Visit (INDEPENDENT_AMBULATORY_CARE_PROVIDER_SITE_OTHER): Payer: Medicare Other | Admitting: Family Medicine

## 2017-03-18 ENCOUNTER — Encounter: Payer: Self-pay | Admitting: Family Medicine

## 2017-03-18 VITALS — BP 93/47 | HR 74 | Ht 73.0 in

## 2017-03-18 DIAGNOSIS — Z7901 Long term (current) use of anticoagulants: Secondary | ICD-10-CM

## 2017-03-18 DIAGNOSIS — I5022 Chronic systolic (congestive) heart failure: Secondary | ICD-10-CM

## 2017-03-18 LAB — COAGUCHEK XS/INR WAIVED
INR: 6.2 (ref 0.9–1.1)
PROTHROMBIN TIME: 74.3 s

## 2017-03-18 NOTE — Progress Notes (Signed)
Subjective:  Patient ID: Melvin Wang, male    DOB: 02/21/1941  Age: 76 y.o. MRN: [id number]  CC: Diabetes (pt here today following up on his diabetes. He states he is feeling better and stronger.)   HPI Melvin Wang presents for recheck of INR blood glucose. Brought in order from Dr. Haroldine Laws for CMET, Ammonia & CBC. Wants him to take care of all of his medications because he is confused when different providers try to manage his care.   History Melvin Wang has a past medical history of Abnormality of gait; Cataract; Cirrhosis of liver with ascites ([place]) (01/19/2017); CKD (chronic kidney disease), stage III; Cor pulmonale ([place]); Essential hypertension; Essential tremor; Foot ulcer, right ([place]) (03/2013); Gout; Hyperlipidemia; Permanent atrial fibrillation ([place]); and Type 2 diabetes mellitus ([place]).   He has a past surgical history that includes Cataract extraction w/PHACO (Left, 04/19/2013); Cataract extraction w/PHACO (Right, 07/15/2013); Esophagogastroduodenoscopy (N/A, 01/21/2017); and Right/Left Heart Cath and Coronary Angiography (N/A, 03/04/2017).   His family history includes Cancer in his paternal uncle; Colon cancer (age of onset: 29) in his father.He reports that he quit smoking about 32 years ago. His smoking use included Cigarettes. He has a 15.00 pack-year smoking history. He has never used smokeless tobacco. He reports that he does not drink alcohol or use drugs.    ROS Review of Systems  Constitutional: Negative.   HENT: Negative.   Respiratory: Negative.   Hematological: Negative for adenopathy. Does not bruise/bleed easily.  Psychiatric/Behavioral: Positive for confusion and decreased concentration.    Objective:  BP (!) 93/47   Pulse 74   Ht 6\' 1"  (1.854 m)   SpO2 96%   BP Readings from Last 3 Encounters:  03/18/17 (!) 93/47  03/13/17 90/66  03/11/17 (!) 72/47    Wt Readings from Last 3 Encounters:  03/13/17 223 lb 6.4 oz (101.3 kg)  03/06/17 223 lb 9.6  oz (101.4 kg)  02/21/17 219 lb (99.3 kg)     Physical Exam  Constitutional: He is oriented to person, place, and time. He appears well-developed.  HENT:  Head: Normocephalic.  Pulmonary/Chest: Effort normal.  Musculoskeletal:  Wheelchair bound.   Neurological: He is alert and oriented to person, place, and time.  Skin:  Mild jaundice of sclerae   Psychiatric: He has a normal mood and affect. His behavior is normal. Thought content normal.  Seems competent  To make his own decisions.      Assessment & Plan:   Melvin Wang was seen today for diabetes.  Diagnoses and all orders for this visit:  On continuous oral anticoagulation -     CoaguChek XS/INR Waived  Chronic systolic heart failure (HCC) -     Comprehensive metabolic panel -     Ammonia -     CBC with Differential/Platelet       I am having Melvin Wang maintain his febuxostat, allopurinol, calcitRIOL, warfarin, amiodarone, Insulin Glargine, pantoprazole, ONE TOUCH ULTRA TEST, B-D UF III MINI PEN NEEDLES, propranolol, Lactulose, and torsemide.  Allergies as of 03/18/2017      Reactions   Acyclovir And Related Hives   Metformin And Related Diarrhea   Amoxicillin Hives, Other (See Comments)   Has patient had a PCN reaction causing immediate rash, facial/tongue/throat swelling, SOB or lightheadedness with hypotension: No Has patient had a PCN reaction causing severe rash involving mucus membranes or skin necrosis: No Has patient had a PCN reaction that required hospitalization No Has patient had a PCN reaction occurring within the last  10 years: No If all of the above answers are "NO", then may proceed with Cephalosporin use.   Augmentin [amoxicillin-pot Clavulanate] Hives, Other (See Comments)   Has patient had a PCN reaction causing immediate rash, facial/tongue/throat swelling, SOB or lightheadedness with hypotension: No Has patient had a PCN reaction causing severe rash involving mucus membranes or skin  necrosis: No Has patient had a PCN reaction that required hospitalization No Has patient had a PCN reaction occurring within the last 10 years: No If all of the above answers are "NO", then may proceed with Cephalosporin use.   Mavik [trandolapril] Other (See Comments)   Reaction:  Caused renal problems      Medication List       Accurate as of 03/18/17  8:54 AM. Always use your most recent med list.          allopurinol 300 MG tablet Commonly known as:  ZYLOPRIM Take 300 mg by mouth daily.   amiodarone 200 MG tablet Commonly known as:  PACERONE Take 1 tablet (200 mg total) by mouth 2 (two) times daily.   B-D UF III MINI PEN NEEDLES 31G X 5 MM Misc Generic drug:  Insulin Pen Needle USE 1 TIME DAILY WITH LANTUS SOLOSTAR   calcitRIOL 0.5 MCG capsule Commonly known as:  ROCALTROL Take 0.5 mcg by mouth daily.   febuxostat 40 MG tablet Commonly known as:  ULORIC Take 1 tablet (40 mg total) by mouth daily.   Insulin Glargine 100 UNIT/ML Solostar Pen Commonly known as:  LANTUS SOLOSTAR Inject 10 Units into the skin every morning. Please call your provider if glucose levels persistently  < 70   Lactulose 20 GM/30ML Soln Take 30 mLs (20 g total) by mouth 2 (two) times daily.   ONE TOUCH ULTRA TEST test strip Generic drug:  glucose blood   pantoprazole 40 MG tablet Commonly known as:  PROTONIX TAKE 1 TABLET (40 MG TOTAL) BY MOUTH 2 (TWO) TIMES DAILY BEFORE A MEAL.   propranolol 20 MG tablet Commonly known as:  INDERAL Take 1 tablet (20 mg total) by mouth 2 (two) times daily.   torsemide 20 MG tablet Commonly known as:  DEMADEX Take 1 tablet (20 mg total) by mouth 2 (two) times daily.   warfarin 10 MG tablet Commonly known as:  COUMADIN Take 5 mg by mouth See admin instructions. Pt takes every evening on Sunday, Tuesday, Wednesday, Thursday, and Saturday.      Pt. Now states that he wants Dr. Missy Sabins to manage his Heart failure, liver failure, INR & Diabetes. He  wants only Dr. Haroldine Laws to address changes of medication.  I advised the patient that his pro time is quite high. He expresses understanding and desires to hear from Dr. Haroldine Laws for adjusting his Coumadin. He declines my advice to hold the medication today unless he hears that from cardiology. He does realize that he should seek emergency care should he start bleeding freely. Additionally he declines further examination and discussion of his diabetes as had been planned for today.  Follow-up: Return if symptoms worsen or fail to improve. Expanding to the patient it was rather unusual for cardiology to manage liver and kidney disease as well as diabetes, however if he has had that discussion with his cardiologist and that is what both of them want that I am happy to defer to that management plan. And labs forwarded to Dr.   Claretta Fraise, M.D.

## 2017-03-19 LAB — CBC WITH DIFFERENTIAL/PLATELET
BASOS ABS: 0 10*3/uL (ref 0.0–0.2)
BASOS: 1 %
EOS (ABSOLUTE): 0.2 10*3/uL (ref 0.0–0.4)
Eos: 2 %
HEMOGLOBIN: 11.7 g/dL — AB (ref 13.0–17.7)
Hematocrit: 34.4 % — ABNORMAL LOW (ref 37.5–51.0)
IMMATURE GRANS (ABS): 0.1 10*3/uL (ref 0.0–0.1)
IMMATURE GRANULOCYTES: 1 %
LYMPHS: 13 %
Lymphocytes Absolute: 1.1 10*3/uL (ref 0.7–3.1)
MCH: 34 pg — AB (ref 26.6–33.0)
MCHC: 34 g/dL (ref 31.5–35.7)
MCV: 100 fL — ABNORMAL HIGH (ref 79–97)
MONOCYTES: 15 %
Monocytes Absolute: 1.3 10*3/uL — ABNORMAL HIGH (ref 0.1–0.9)
NEUTROS ABS: 5.7 10*3/uL (ref 1.4–7.0)
NEUTROS PCT: 68 %
PLATELETS: 252 10*3/uL (ref 150–379)
RBC: 3.44 x10E6/uL — ABNORMAL LOW (ref 4.14–5.80)
RDW: 16.2 % — ABNORMAL HIGH (ref 12.3–15.4)
WBC: 8.3 10*3/uL (ref 3.4–10.8)

## 2017-03-19 LAB — COMPREHENSIVE METABOLIC PANEL
A/G RATIO: 0.7 — AB (ref 1.2–2.2)
ALT: 16 IU/L (ref 0–44)
AST: 21 IU/L (ref 0–40)
Albumin: 3.1 g/dL — ABNORMAL LOW (ref 3.5–4.8)
Alkaline Phosphatase: 267 IU/L — ABNORMAL HIGH (ref 39–117)
BILIRUBIN TOTAL: 1.6 mg/dL — AB (ref 0.0–1.2)
BUN/Creatinine Ratio: 29 — ABNORMAL HIGH (ref 10–24)
BUN: 59 mg/dL — ABNORMAL HIGH (ref 8–27)
CHLORIDE: 89 mmol/L — AB (ref 96–106)
CO2: 29 mmol/L (ref 18–29)
Calcium: 10 mg/dL (ref 8.6–10.2)
Creatinine, Ser: 2.05 mg/dL — ABNORMAL HIGH (ref 0.76–1.27)
GFR, EST AFRICAN AMERICAN: 36 mL/min/{1.73_m2} — AB (ref >59)
GFR, EST NON AFRICAN AMERICAN: 31 mL/min/{1.73_m2} — AB (ref >59)
Globulin, Total: 4.2 g/dL (ref 1.5–4.5)
Glucose: 88 mg/dL (ref 65–99)
Potassium: 4.3 mmol/L (ref 3.5–5.2)
Sodium: 133 mmol/L — ABNORMAL LOW (ref 134–144)
TOTAL PROTEIN: 7.3 g/dL (ref 6.0–8.5)

## 2017-03-19 LAB — AMMONIA: AMMONIA: 36 ug/dL (ref 27–102)

## 2017-03-19 MED ORDER — WARFARIN SODIUM 2.5 MG PO TABS: ORAL_TABLET | ORAL | 0 refills | Status: DC

## 2017-03-19 NOTE — Addendum Note (Signed)
Addended by: Marylin Crosby on: 03/19/2017 05:10 PM   Modules accepted: Orders

## 2017-03-20 ENCOUNTER — Telehealth: Payer: Self-pay | Admitting: Physician Assistant

## 2017-03-20 ENCOUNTER — Ambulatory Visit (INDEPENDENT_AMBULATORY_CARE_PROVIDER_SITE_OTHER): Payer: Medicare Other | Admitting: Pharmacist

## 2017-03-20 ENCOUNTER — Inpatient Hospital Stay (HOSPITAL_COMMUNITY)
Admission: EM | Admit: 2017-03-20 | Discharge: 2017-03-26 | DRG: 441 | Disposition: A | Discharge status: Home Health | Payer: Medicare Other | Attending: Internal Medicine | Admitting: Internal Medicine

## 2017-03-20 ENCOUNTER — Emergency Department (HOSPITAL_COMMUNITY): Payer: Medicare Other

## 2017-03-20 ENCOUNTER — Encounter (HOSPITAL_COMMUNITY): Payer: Self-pay | Admitting: Emergency Medicine

## 2017-03-20 DIAGNOSIS — D649 Anemia, unspecified: Secondary | ICD-10-CM | POA: Diagnosis present

## 2017-03-20 DIAGNOSIS — J9811 Atelectasis: Secondary | ICD-10-CM | POA: Diagnosis not present

## 2017-03-20 DIAGNOSIS — G9340 Encephalopathy, unspecified: Secondary | ICD-10-CM | POA: Diagnosis not present

## 2017-03-20 DIAGNOSIS — R531 Weakness: Secondary | ICD-10-CM

## 2017-03-20 DIAGNOSIS — I10 Essential (primary) hypertension: Secondary | ICD-10-CM | POA: Diagnosis not present

## 2017-03-20 DIAGNOSIS — I313 Pericardial effusion (noninflammatory): Secondary | ICD-10-CM | POA: Diagnosis present

## 2017-03-20 DIAGNOSIS — E876 Hypokalemia: Secondary | ICD-10-CM | POA: Diagnosis not present

## 2017-03-20 DIAGNOSIS — Z888 Allergy status to other drugs, medicaments and biological substances status: Secondary | ICD-10-CM

## 2017-03-20 DIAGNOSIS — E785 Hyperlipidemia, unspecified: Secondary | ICD-10-CM | POA: Diagnosis present

## 2017-03-20 DIAGNOSIS — K7290 Hepatic failure, unspecified without coma: Principal | ICD-10-CM | POA: Diagnosis present

## 2017-03-20 DIAGNOSIS — Z961 Presence of intraocular lens: Secondary | ICD-10-CM | POA: Diagnosis present

## 2017-03-20 DIAGNOSIS — R278 Other lack of coordination: Secondary | ICD-10-CM | POA: Diagnosis not present

## 2017-03-20 DIAGNOSIS — I5033 Acute on chronic diastolic (congestive) heart failure: Secondary | ICD-10-CM

## 2017-03-20 DIAGNOSIS — I639 Cerebral infarction, unspecified: Secondary | ICD-10-CM

## 2017-03-20 DIAGNOSIS — R569 Unspecified convulsions: Secondary | ICD-10-CM | POA: Diagnosis not present

## 2017-03-20 DIAGNOSIS — I480 Paroxysmal atrial fibrillation: Secondary | ICD-10-CM | POA: Diagnosis present

## 2017-03-20 DIAGNOSIS — Z9841 Cataract extraction status, right eye: Secondary | ICD-10-CM

## 2017-03-20 DIAGNOSIS — R197 Diarrhea, unspecified: Secondary | ICD-10-CM | POA: Diagnosis present

## 2017-03-20 DIAGNOSIS — Z66 Do not resuscitate: Secondary | ICD-10-CM | POA: Diagnosis not present

## 2017-03-20 DIAGNOSIS — K7460 Unspecified cirrhosis of liver: Secondary | ICD-10-CM | POA: Diagnosis present

## 2017-03-20 DIAGNOSIS — I5082 Biventricular heart failure: Secondary | ICD-10-CM | POA: Diagnosis not present

## 2017-03-20 DIAGNOSIS — I509 Heart failure, unspecified: Secondary | ICD-10-CM | POA: Diagnosis not present

## 2017-03-20 DIAGNOSIS — G9341 Metabolic encephalopathy: Secondary | ICD-10-CM | POA: Diagnosis not present

## 2017-03-20 DIAGNOSIS — R112 Nausea with vomiting, unspecified: Secondary | ICD-10-CM | POA: Diagnosis not present

## 2017-03-20 DIAGNOSIS — I4821 Permanent atrial fibrillation: Secondary | ICD-10-CM | POA: Diagnosis present

## 2017-03-20 DIAGNOSIS — I130 Hypertensive heart and chronic kidney disease with heart failure and stage 1 through stage 4 chronic kidney disease, or unspecified chronic kidney disease: Secondary | ICD-10-CM | POA: Diagnosis present

## 2017-03-20 DIAGNOSIS — I482 Chronic atrial fibrillation: Secondary | ICD-10-CM | POA: Diagnosis present

## 2017-03-20 DIAGNOSIS — N179 Acute kidney failure, unspecified: Secondary | ICD-10-CM | POA: Diagnosis not present

## 2017-03-20 DIAGNOSIS — I5084 End stage heart failure: Secondary | ICD-10-CM | POA: Diagnosis not present

## 2017-03-20 DIAGNOSIS — I959 Hypotension, unspecified: Secondary | ICD-10-CM | POA: Diagnosis not present

## 2017-03-20 DIAGNOSIS — Z87891 Personal history of nicotine dependence: Secondary | ICD-10-CM

## 2017-03-20 DIAGNOSIS — Z7901 Long term (current) use of anticoagulants: Secondary | ICD-10-CM

## 2017-03-20 DIAGNOSIS — R410 Disorientation, unspecified: Secondary | ICD-10-CM | POA: Diagnosis not present

## 2017-03-20 DIAGNOSIS — R627 Adult failure to thrive: Secondary | ICD-10-CM | POA: Diagnosis not present

## 2017-03-20 DIAGNOSIS — I5042 Chronic combined systolic (congestive) and diastolic (congestive) heart failure: Secondary | ICD-10-CM | POA: Diagnosis not present

## 2017-03-20 DIAGNOSIS — Z79899 Other long term (current) drug therapy: Secondary | ICD-10-CM

## 2017-03-20 DIAGNOSIS — I5032 Chronic diastolic (congestive) heart failure: Secondary | ICD-10-CM | POA: Diagnosis not present

## 2017-03-20 DIAGNOSIS — R4789 Other speech disturbances: Secondary | ICD-10-CM

## 2017-03-20 DIAGNOSIS — E1129 Type 2 diabetes mellitus with other diabetic kidney complication: Secondary | ICD-10-CM | POA: Diagnosis present

## 2017-03-20 DIAGNOSIS — R269 Unspecified abnormalities of gait and mobility: Secondary | ICD-10-CM | POA: Diagnosis present

## 2017-03-20 DIAGNOSIS — R4189 Other symptoms and signs involving cognitive functions and awareness: Secondary | ICD-10-CM | POA: Diagnosis present

## 2017-03-20 DIAGNOSIS — I6330 Cerebral infarction due to thrombosis of unspecified cerebral artery: Secondary | ICD-10-CM | POA: Diagnosis not present

## 2017-03-20 DIAGNOSIS — Z9842 Cataract extraction status, left eye: Secondary | ICD-10-CM

## 2017-03-20 DIAGNOSIS — I50810 Right heart failure, unspecified: Secondary | ICD-10-CM | POA: Diagnosis not present

## 2017-03-20 DIAGNOSIS — E860 Dehydration: Secondary | ICD-10-CM | POA: Diagnosis present

## 2017-03-20 DIAGNOSIS — I2721 Secondary pulmonary arterial hypertension: Secondary | ICD-10-CM | POA: Diagnosis not present

## 2017-03-20 DIAGNOSIS — E7220 Disorder of urea cycle metabolism, unspecified: Secondary | ICD-10-CM | POA: Diagnosis not present

## 2017-03-20 DIAGNOSIS — E1122 Type 2 diabetes mellitus with diabetic chronic kidney disease: Secondary | ICD-10-CM | POA: Diagnosis present

## 2017-03-20 DIAGNOSIS — N183 Chronic kidney disease, stage 3 (moderate): Secondary | ICD-10-CM | POA: Diagnosis present

## 2017-03-20 DIAGNOSIS — Z881 Allergy status to other antibiotic agents status: Secondary | ICD-10-CM

## 2017-03-20 DIAGNOSIS — D631 Anemia in chronic kidney disease: Secondary | ICD-10-CM | POA: Diagnosis present

## 2017-03-20 DIAGNOSIS — R000 Tachycardia, unspecified: Secondary | ICD-10-CM | POA: Diagnosis present

## 2017-03-20 DIAGNOSIS — I319 Disease of pericardium, unspecified: Secondary | ICD-10-CM | POA: Diagnosis not present

## 2017-03-20 DIAGNOSIS — Z794 Long term (current) use of insulin: Secondary | ICD-10-CM

## 2017-03-20 LAB — CBC
HEMATOCRIT: 33.6 % — AB (ref 39.0–52.0)
HEMOGLOBIN: 11.4 g/dL — AB (ref 13.0–17.0)
MCH: 33.7 pg (ref 26.0–34.0)
MCHC: 33.9 g/dL (ref 30.0–36.0)
MCV: 99.4 fL (ref 78.0–100.0)
Platelets: 245 10*3/uL (ref 150–400)
RBC: 3.38 MIL/uL — AB (ref 4.22–5.81)
RDW: 15.8 % — ABNORMAL HIGH (ref 11.5–15.5)
WBC: 10.6 10*3/uL — AB (ref 4.0–10.5)

## 2017-03-20 LAB — COAGUCHEK XS/INR WAIVED
INR: 4.3 — AB (ref 0.9–1.1)
PROTHROMBIN TIME: 51.9 s

## 2017-03-20 LAB — COMPREHENSIVE METABOLIC PANEL
ALT: 17 U/L (ref 17–63)
ANION GAP: 16 — AB (ref 5–15)
AST: 27 U/L (ref 15–41)
Albumin: 2.9 g/dL — ABNORMAL LOW (ref 3.5–5.0)
Alkaline Phosphatase: 224 U/L — ABNORMAL HIGH (ref 38–126)
BUN: 54 mg/dL — ABNORMAL HIGH (ref 6–20)
CHLORIDE: 90 mmol/L — AB (ref 101–111)
CO2: 26 mmol/L (ref 22–32)
Calcium: 9.6 mg/dL (ref 8.9–10.3)
Creatinine, Ser: 2.27 mg/dL — ABNORMAL HIGH (ref 0.61–1.24)
GFR, EST AFRICAN AMERICAN: 31 mL/min — AB (ref >60)
GFR, EST NON AFRICAN AMERICAN: 27 mL/min — AB (ref >60)
Glucose, Bld: 85 mg/dL (ref 65–99)
POTASSIUM: 4.1 mmol/L (ref 3.5–5.1)
Sodium: 132 mmol/L — ABNORMAL LOW (ref 135–145)
Total Bilirubin: 2.3 mg/dL — ABNORMAL HIGH (ref 0.3–1.2)
Total Protein: 7.5 g/dL (ref 6.5–8.1)

## 2017-03-20 LAB — CBG MONITORING, ED
GLUCOSE-CAPILLARY: 116 mg/dL — AB (ref 65–99)
GLUCOSE-CAPILLARY: 99 mg/dL (ref 65–99)
Glucose-Capillary: 81 mg/dL (ref 65–99)

## 2017-03-20 LAB — I-STAT CG4 LACTIC ACID, ED: LACTIC ACID, VENOUS: 1.45 mmol/L (ref 0.5–1.9)

## 2017-03-20 LAB — I-STAT TROPONIN, ED: TROPONIN I, POC: 0.01 ng/mL (ref 0.00–0.08)

## 2017-03-20 LAB — AMMONIA: AMMONIA: 23 umol/L (ref 9–35)

## 2017-03-20 LAB — LIPASE, BLOOD: Lipase: 41 U/L (ref 11–51)

## 2017-03-20 MED ORDER — SODIUM CHLORIDE 0.9 % IV BOLUS (SEPSIS)
500.0000 mL | Freq: Once | INTRAVENOUS | Status: AC
  Administered 2017-03-20: 500 mL via INTRAVENOUS

## 2017-03-20 NOTE — ED Triage Notes (Signed)
BIB EMS from home, family called out for AMS, pt appears to be A/OX4 upon arrival. Pt also reported N/V with EMS. Pt recently admitted for elevated ammonia levels. CBG 81. VSS. EDP at bedside.

## 2017-03-20 NOTE — ED Notes (Signed)
Pt given apple juice and encouraged pt to drink. Will continue to monitor sugar.

## 2017-03-20 NOTE — ED Notes (Signed)
Pt has now become inc agitated and altered, pt has some lucid thoughts and others are random. EDP is aware and was at bedside speaking to pt as well.

## 2017-03-20 NOTE — Telephone Encounter (Signed)
Patient's daughter called answering service to let us know she has called EMS - patient has been vomiting and having diarrhea all day. H/o RV failure, liver cirrhosis, elevated ammonia. This evening he is more confused and less responsive, holding onto the bookcase in an odd fashion. EMS was arriving in the background. I told her she did the right thing by calling 911 and will forward as FYI to Dr. Haroldine Laws.  Zeena Starkel PA-C

## 2017-03-20 NOTE — Patient Instructions (Signed)
Anticoagulation Dose Instructions as of 03/20/2017      Melvin Wang Tue Wed Thu Fri Sat        Hold Hold 2.5 mg   New Dose 2.5 mg 2.5 mg 2.5 mg 2.5 mg 2.5 mg 2.5 mg 2.5 mg    Description   Take one (1) tablet of 2.5mg  tablet once a day  INR today 4.3 today

## 2017-03-21 ENCOUNTER — Observation Stay (HOSPITAL_COMMUNITY): Payer: Medicare Other

## 2017-03-21 ENCOUNTER — Other Ambulatory Visit (HOSPITAL_COMMUNITY): Payer: Self-pay | Admitting: *Deleted

## 2017-03-21 ENCOUNTER — Encounter (HOSPITAL_COMMUNITY): Payer: Self-pay | Admitting: Internal Medicine

## 2017-03-21 DIAGNOSIS — I313 Pericardial effusion (noninflammatory): Secondary | ICD-10-CM

## 2017-03-21 DIAGNOSIS — I509 Heart failure, unspecified: Secondary | ICD-10-CM | POA: Diagnosis not present

## 2017-03-21 DIAGNOSIS — R112 Nausea with vomiting, unspecified: Secondary | ICD-10-CM | POA: Diagnosis present

## 2017-03-21 DIAGNOSIS — I10 Essential (primary) hypertension: Secondary | ICD-10-CM | POA: Diagnosis not present

## 2017-03-21 DIAGNOSIS — E876 Hypokalemia: Secondary | ICD-10-CM | POA: Diagnosis not present

## 2017-03-21 DIAGNOSIS — K7460 Unspecified cirrhosis of liver: Secondary | ICD-10-CM | POA: Diagnosis not present

## 2017-03-21 DIAGNOSIS — I639 Cerebral infarction, unspecified: Secondary | ICD-10-CM | POA: Diagnosis present

## 2017-03-21 DIAGNOSIS — I319 Disease of pericardium, unspecified: Secondary | ICD-10-CM | POA: Insufficient documentation

## 2017-03-21 DIAGNOSIS — I5042 Chronic combined systolic (congestive) and diastolic (congestive) heart failure: Secondary | ICD-10-CM | POA: Diagnosis present

## 2017-03-21 DIAGNOSIS — I5033 Acute on chronic diastolic (congestive) heart failure: Secondary | ICD-10-CM | POA: Diagnosis not present

## 2017-03-21 DIAGNOSIS — N179 Acute kidney failure, unspecified: Secondary | ICD-10-CM | POA: Diagnosis not present

## 2017-03-21 DIAGNOSIS — I50810 Right heart failure, unspecified: Secondary | ICD-10-CM | POA: Diagnosis not present

## 2017-03-21 DIAGNOSIS — R269 Unspecified abnormalities of gait and mobility: Secondary | ICD-10-CM | POA: Diagnosis present

## 2017-03-21 DIAGNOSIS — I130 Hypertensive heart and chronic kidney disease with heart failure and stage 1 through stage 4 chronic kidney disease, or unspecified chronic kidney disease: Secondary | ICD-10-CM | POA: Diagnosis present

## 2017-03-21 DIAGNOSIS — I5082 Biventricular heart failure: Secondary | ICD-10-CM | POA: Diagnosis present

## 2017-03-21 DIAGNOSIS — E7220 Disorder of urea cycle metabolism, unspecified: Secondary | ICD-10-CM | POA: Diagnosis present

## 2017-03-21 DIAGNOSIS — R188 Other ascites: Secondary | ICD-10-CM | POA: Diagnosis not present

## 2017-03-21 DIAGNOSIS — I308 Other forms of acute pericarditis: Secondary | ICD-10-CM

## 2017-03-21 DIAGNOSIS — R569 Unspecified convulsions: Secondary | ICD-10-CM

## 2017-03-21 DIAGNOSIS — G9340 Encephalopathy, unspecified: Secondary | ICD-10-CM | POA: Diagnosis not present

## 2017-03-21 DIAGNOSIS — R410 Disorientation, unspecified: Secondary | ICD-10-CM | POA: Diagnosis present

## 2017-03-21 DIAGNOSIS — R4189 Other symptoms and signs involving cognitive functions and awareness: Secondary | ICD-10-CM | POA: Diagnosis present

## 2017-03-21 DIAGNOSIS — E860 Dehydration: Secondary | ICD-10-CM | POA: Diagnosis not present

## 2017-03-21 DIAGNOSIS — I5084 End stage heart failure: Secondary | ICD-10-CM | POA: Diagnosis present

## 2017-03-21 DIAGNOSIS — D649 Anemia, unspecified: Secondary | ICD-10-CM | POA: Diagnosis present

## 2017-03-21 DIAGNOSIS — E785 Hyperlipidemia, unspecified: Secondary | ICD-10-CM | POA: Diagnosis present

## 2017-03-21 DIAGNOSIS — E1122 Type 2 diabetes mellitus with diabetic chronic kidney disease: Secondary | ICD-10-CM | POA: Diagnosis not present

## 2017-03-21 DIAGNOSIS — I6330 Cerebral infarction due to thrombosis of unspecified cerebral artery: Secondary | ICD-10-CM | POA: Diagnosis not present

## 2017-03-21 DIAGNOSIS — I480 Paroxysmal atrial fibrillation: Secondary | ICD-10-CM | POA: Diagnosis present

## 2017-03-21 DIAGNOSIS — I2721 Secondary pulmonary arterial hypertension: Secondary | ICD-10-CM | POA: Diagnosis not present

## 2017-03-21 DIAGNOSIS — R278 Other lack of coordination: Secondary | ICD-10-CM | POA: Diagnosis present

## 2017-03-21 DIAGNOSIS — I720 Aneurysm of carotid artery: Secondary | ICD-10-CM | POA: Diagnosis not present

## 2017-03-21 DIAGNOSIS — I482 Chronic atrial fibrillation: Secondary | ICD-10-CM | POA: Diagnosis not present

## 2017-03-21 DIAGNOSIS — K7290 Hepatic failure, unspecified without coma: Secondary | ICD-10-CM | POA: Diagnosis present

## 2017-03-21 DIAGNOSIS — G9341 Metabolic encephalopathy: Secondary | ICD-10-CM | POA: Diagnosis present

## 2017-03-21 DIAGNOSIS — I5032 Chronic diastolic (congestive) heart failure: Secondary | ICD-10-CM | POA: Diagnosis not present

## 2017-03-21 DIAGNOSIS — Z66 Do not resuscitate: Secondary | ICD-10-CM | POA: Diagnosis present

## 2017-03-21 DIAGNOSIS — R531 Weakness: Secondary | ICD-10-CM

## 2017-03-21 DIAGNOSIS — I959 Hypotension, unspecified: Secondary | ICD-10-CM | POA: Diagnosis present

## 2017-03-21 DIAGNOSIS — R627 Adult failure to thrive: Secondary | ICD-10-CM | POA: Diagnosis present

## 2017-03-21 LAB — ECHOCARDIOGRAM LIMITED
CHL CUP TV REG PEAK VELOCITY: 238 cm/s
FS: 28 % (ref 28–44)
Height: 73 in
IV/PV OW: 1.2
LA diam end sys: 51 mm
LADIAMINDEX: 2.28 cm/m2
LASIZE: 51 mm
LV PW d: 10 mm — AB (ref 0.6–1.1)
LVOT area: 2.84 cm2
LVOTD: 19 mm
RV sys press: 38 mmHg
TR max vel: 238 cm/s
WEIGHTICAEL: 3520 [oz_av]

## 2017-03-21 LAB — CBG MONITORING, ED
GLUCOSE-CAPILLARY: 93 mg/dL (ref 65–99)
Glucose-Capillary: 102 mg/dL — ABNORMAL HIGH (ref 65–99)

## 2017-03-21 LAB — CBC WITH DIFFERENTIAL/PLATELET
Basophils Absolute: 0 10*3/uL (ref 0.0–0.1)
Basophils Relative: 0 %
EOS ABS: 0.1 10*3/uL (ref 0.0–0.7)
EOS PCT: 1 %
HCT: 32.1 % — ABNORMAL LOW (ref 39.0–52.0)
Hemoglobin: 11 g/dL — ABNORMAL LOW (ref 13.0–17.0)
LYMPHS ABS: 1.5 10*3/uL (ref 0.7–4.0)
Lymphocytes Relative: 19 %
MCH: 34.1 pg — AB (ref 26.0–34.0)
MCHC: 34.3 g/dL (ref 30.0–36.0)
MCV: 99.4 fL (ref 78.0–100.0)
MONO ABS: 0.8 10*3/uL (ref 0.1–1.0)
MONOS PCT: 10 %
Neutro Abs: 5.7 10*3/uL (ref 1.7–7.7)
Neutrophils Relative %: 70 %
PLATELETS: 174 10*3/uL (ref 150–400)
RBC: 3.23 MIL/uL — ABNORMAL LOW (ref 4.22–5.81)
RDW: 16.2 % — ABNORMAL HIGH (ref 11.5–15.5)
WBC: 8.1 10*3/uL (ref 4.0–10.5)

## 2017-03-21 LAB — GLUCOSE, CAPILLARY
Glucose-Capillary: 89 mg/dL (ref 65–99)
Glucose-Capillary: 87 mg/dL (ref 65–99)

## 2017-03-21 LAB — URINALYSIS, ROUTINE W REFLEX MICROSCOPIC
Bilirubin Urine: NEGATIVE
GLUCOSE, UA: NEGATIVE mg/dL
Hgb urine dipstick: NEGATIVE
Ketones, ur: NEGATIVE mg/dL
LEUKOCYTES UA: NEGATIVE
Nitrite: NEGATIVE
PH: 5 (ref 5.0–8.0)
Protein, ur: NEGATIVE mg/dL
Specific Gravity, Urine: 1.01 (ref 1.005–1.030)

## 2017-03-21 LAB — LACTIC ACID, PLASMA
Lactic Acid, Venous: 1.1 mmol/L (ref 0.5–1.9)
Lactic Acid, Venous: 1.4 mmol/L (ref 0.5–1.9)

## 2017-03-21 LAB — BASIC METABOLIC PANEL
Anion gap: 10 (ref 5–15)
BUN: 51 mg/dL — AB (ref 6–20)
CHLORIDE: 97 mmol/L — AB (ref 101–111)
CO2: 25 mmol/L (ref 22–32)
CREATININE: 2.05 mg/dL — AB (ref 0.61–1.24)
Calcium: 8.6 mg/dL — ABNORMAL LOW (ref 8.9–10.3)
GFR calc Af Amer: 35 mL/min — ABNORMAL LOW (ref >60)
GFR calc non Af Amer: 30 mL/min — ABNORMAL LOW (ref >60)
GLUCOSE: 77 mg/dL (ref 65–99)
Potassium: 3.7 mmol/L (ref 3.5–5.1)
Sodium: 132 mmol/L — ABNORMAL LOW (ref 135–145)

## 2017-03-21 LAB — TYPE AND SCREEN
ABO/RH(D): O POS
Antibody Screen: NEGATIVE

## 2017-03-21 LAB — PROTIME-INR
INR: 3.72
Prothrombin Time: 37.8 seconds — ABNORMAL HIGH (ref 11.4–15.2)

## 2017-03-21 LAB — HEPATIC FUNCTION PANEL
ALT: 16 U/L — ABNORMAL LOW (ref 17–63)
AST: 22 U/L (ref 15–41)
Albumin: 2.5 g/dL — ABNORMAL LOW (ref 3.5–5.0)
Alkaline Phosphatase: 205 U/L — ABNORMAL HIGH (ref 38–126)
BILIRUBIN DIRECT: 0.7 mg/dL — AB (ref 0.1–0.5)
BILIRUBIN INDIRECT: 1.4 mg/dL — AB (ref 0.3–0.9)
TOTAL PROTEIN: 6.7 g/dL (ref 6.5–8.1)
Total Bilirubin: 2.1 mg/dL — ABNORMAL HIGH (ref 0.3–1.2)

## 2017-03-21 LAB — TROPONIN I
TROPONIN I: 0.03 ng/mL — AB (ref <0.03)
Troponin I: 0.03 ng/mL (ref <0.03)

## 2017-03-21 LAB — ABO/RH: ABO/RH(D): O POS

## 2017-03-21 LAB — TSH: TSH: 2.724 u[IU]/mL (ref 0.350–4.500)

## 2017-03-21 MED ORDER — CALCITRIOL 0.25 MCG PO CAPS
0.5000 ug | ORAL_CAPSULE | Freq: Every day | ORAL | Status: DC
  Administered 2017-03-21 – 2017-03-25 (×4): 0.5 ug via ORAL
  Filled 2017-03-21 (×3): qty 2
  Filled 2017-03-21: qty 1
  Filled 2017-03-21 (×2): qty 2
  Filled 2017-03-21: qty 1

## 2017-03-21 MED ORDER — ALLOPURINOL 300 MG PO TABS
300.0000 mg | ORAL_TABLET | Freq: Every day | ORAL | Status: DC
Start: 2017-03-21 — End: 2017-03-26
  Administered 2017-03-22 – 2017-03-25 (×3): 300 mg via ORAL
  Filled 2017-03-21 (×7): qty 1

## 2017-03-21 MED ORDER — TORSEMIDE 20 MG PO TABS
20.0000 mg | ORAL_TABLET | Freq: Two times a day (BID) | ORAL | Status: DC
  Administered 2017-03-21 – 2017-03-22 (×2): 20 mg via ORAL
  Filled 2017-03-21 (×3): qty 1

## 2017-03-21 MED ORDER — ACETAMINOPHEN 650 MG RE SUPP: 650.0000 mg | Freq: Four times a day (QID) | RECTAL | Status: DC | PRN

## 2017-03-21 MED ORDER — PANTOPRAZOLE SODIUM 40 MG PO TBEC
40.0000 mg | DELAYED_RELEASE_TABLET | Freq: Two times a day (BID) | ORAL | Status: DC
  Administered 2017-03-21 – 2017-03-26 (×9): 40 mg via ORAL
  Filled 2017-03-21 (×11): qty 1

## 2017-03-21 MED ORDER — LORAZEPAM 2 MG/ML IJ SOLN
INTRAMUSCULAR | Status: AC
  Administered 2017-03-21: 1 mg
  Filled 2017-03-21: qty 1

## 2017-03-21 MED ORDER — LORAZEPAM 2 MG/ML IJ SOLN
0.5000 mg | Freq: Once | INTRAMUSCULAR | Status: AC
  Administered 2017-03-21: 0.5 mg via INTRAVENOUS
  Filled 2017-03-21: qty 1

## 2017-03-21 MED ORDER — DEXTROSE 5 % IV SOLN
2.0000 g | INTRAVENOUS | Status: DC
  Administered 2017-03-21: 2 g via INTRAVENOUS
  Filled 2017-03-21: qty 2

## 2017-03-21 MED ORDER — SODIUM CHLORIDE 0.9 % IV SOLN
100.0000 mg | Freq: Two times a day (BID) | INTRAVENOUS | Status: DC
  Administered 2017-03-21 – 2017-03-22 (×2): 100 mg via INTRAVENOUS
  Filled 2017-03-21 (×4): qty 10

## 2017-03-21 MED ORDER — INSULIN ASPART 100 UNIT/ML ~~LOC~~ SOLN
0.0000 [IU] | Freq: Three times a day (TID) | SUBCUTANEOUS | Status: DC
  Administered 2017-03-24: 1 [IU] via SUBCUTANEOUS

## 2017-03-21 MED ORDER — LACTULOSE 10 GM/15ML PO SOLN
20.0000 g | Freq: Two times a day (BID) | ORAL | Status: DC
  Administered 2017-03-21 – 2017-03-22 (×2): 20 g via ORAL
  Filled 2017-03-21 (×5): qty 30

## 2017-03-21 MED ORDER — SODIUM CHLORIDE 0.9 % IV BOLUS (SEPSIS)
500.0000 mL | Freq: Once | INTRAVENOUS | Status: AC
  Administered 2017-03-21: 500 mL via INTRAVENOUS

## 2017-03-21 MED ORDER — SODIUM CHLORIDE 0.9 % IV SOLN
200.0000 mg | INTRAVENOUS | Status: AC
  Administered 2017-03-21: 200 mg via INTRAVENOUS
  Filled 2017-03-21: qty 20

## 2017-03-21 MED ORDER — COLCHICINE 0.6 MG PO TABS
0.6000 mg | ORAL_TABLET | Freq: Two times a day (BID) | ORAL | Status: DC
  Administered 2017-03-21 – 2017-03-25 (×7): 0.6 mg via ORAL
  Filled 2017-03-21 (×9): qty 1

## 2017-03-21 MED ORDER — AMIODARONE HCL 200 MG PO TABS
200.0000 mg | ORAL_TABLET | Freq: Two times a day (BID) | ORAL | Status: DC
  Administered 2017-03-21 – 2017-03-25 (×7): 200 mg via ORAL
  Filled 2017-03-21 (×9): qty 1

## 2017-03-21 MED ORDER — FEBUXOSTAT 40 MG PO TABS
40.0000 mg | ORAL_TABLET | Freq: Every day | ORAL | Status: DC
  Administered 2017-03-22 – 2017-03-25 (×3): 40 mg via ORAL
  Filled 2017-03-21 (×6): qty 1

## 2017-03-21 MED ORDER — ACETAMINOPHEN 325 MG PO TABS: 650.0000 mg | ORAL_TABLET | Freq: Four times a day (QID) | ORAL | Status: DC | PRN

## 2017-03-21 NOTE — Progress Notes (Signed)
Patient trasfered from ED to 5W 08 via stretcher; alert and oriented x 4; no complaints of pain; IV saline locked in RFA. Orient patient to room and unit; gave patient care guide; instructed how to use the call bell and  fall risk precautions. Will continue to monitor the patient.

## 2017-03-21 NOTE — Progress Notes (Addendum)
ANTICOAGULATION CONSULT NOTE - Initial Consult  Pharmacy Consult for Warfarin  Indication: atrial fibrillation   Patient Measurements: Height: 6\' 1"  (185.4 cm) Weight: 220 lb (99.8 kg) IBW/kg (Calculated) : 79.9  Vital Signs: Temp: 98.1 F (36.7 C) (04/12 2114) Temp Source: Oral (04/12 2114) BP: 93/56 (04/13 0430) Pulse Rate: 50 (04/13 0430)  Labs:  Recent Labs  03/18/17 0820 03/18/17 0849 03/20/17 1203 03/20/17 2114  HGB  --   --   --  11.4*  HCT  --  34.4*  --  33.6*  PLT  --  252  --  245  INR 6.2*  --  4.3*  --   CREATININE  --  2.05*  --  2.27*    Estimated Creatinine Clearance: 36 mL/min (A) (by C-G formula based on SCr of 2.27 mg/dL (H)).   Medical History: Past Medical History:  Diagnosis Date  . Abnormality of gait   . Cataract   . Cirrhosis of liver with ascites ([place]) 01/19/2017  . CKD (chronic kidney disease), stage III    [facility]  . Cor pulmonale (HCC)    Possible  . Essential hypertension   . Essential tremor   . Foot ulcer, right ([place]) 03/2013  . Gout   . Hyperlipidemia   . Permanent atrial fibrillation (HCC)    a. >20 years, prior DCCVs unsuccessful, INR followed by PCP.  Marland Kitchen Type 2 diabetes mellitus [facility])     Assessment: 76 y/o M on warfarin PTA for afib here with altered mental status. ON warfarin PTA for afib. INR 4.3 at outpatient anti-coag visit yesterday. No inpatient INR yet.   Goal of Therapy:  INR 2-3 Monitor platelets by anticoagulation protocol: Yes   Plan:  -INR with AM labs to assess dosing needs  Narda Bonds 03/21/2017,4:40 AM   Addendum: INR remains elevated. Will hold today's dose of warfarin.  Salome Arnt, PharmD, BCPS 03/21/2017 8:12 AM

## 2017-03-21 NOTE — ED Notes (Signed)
Pt still not responding, [place] at bedside, neuro en route. Pt appears to be seizing. Eyes twitching and pt still not responding.

## 2017-03-21 NOTE — Progress Notes (Signed)
PROGRESS NOTE    Melvin Wang  JAS:[id number] DOB: Nov 26, 1941 DOA: 03/20/2017 PCP: Claretta Fraise, MD    Brief Narrative:  76 yo male with heart failure and cirrhosis, presents with episodes of confusion. In the ED developed 3 episodes of altered mentation, eyes fluttering, not verbal but follow commands. Lasting 3 to 4 minutes. On examination hemodynamically stable. Neurology consulted and patient started on Vimpat. MRI with small punctuate subacute white matter infarct in the posterior left pons, questionable findings.    Assessment & Plan:   Principal Problem:   Seizure ([place]) Active Problems:   Essential hypertension   Permanent atrial fibrillation (HCC)   Anemia   Cirrhosis of liver without ascites (HCC)   Type 2 diabetes mellitus with renal complication (HCC)   RVF (right ventricular failure) (HCC)   PAH (pulmonary artery hypertension)   Weakness   Acute encephalopathy  1. New onset seizures. Patient has responded will to vimpat, will continue neuro checks per unit protocol and neurology recommendations. MRI with questionable CVA. Will continue blood pressure monitoring, avoid hypotension.   2. Systolic heart failure chronic and stable. Clinically euvolemic, will continue diuresis, target negative fluid balance. Torsemide 20 mg day. Pericardial effusion with no signs of tamponade follow on echocardiogram.   3. Paroxysmal atrial fibrillation. Continue rate control, continue amiodarone   4. Cirrhosis. Ammonia not elevated, no asterixis, no clinical signs of portosystemic encephalopathy. Will continue lactulose per home regimen.  No further abdominal pain, will hold on antibiotic therapy for now.     5. HTN. Continue blood pressure control, avoid hypotension   6. CKD stage 3. Renal functio stable, follow renal panel in am, avoid hypotension or nephrotoxic medications.   7. T2DM. Continue glucose cover and monitoring with sliding scale.    DVT prophylaxis: on warfarin  at home  Code Status: full  Family Communication: I spoke with patient's family at the bedside and all questions were addressed.  Disposition Plan: home    Consultants:   Neurology  Cardiology   Procedures:    Antimicrobials:   Subjective: Patient is more awake and alert, no further episodes of altered mentation, no nausea or vomiting, tolerating po well. No confusion or agitation.   Objective: Vitals:   03/21/17 0945 03/21/17 1000 03/21/17 1200 03/21/17 1245  BP:  (!) 89/66 (!) 88/65 94/62  Pulse:  80 76 (!) 59  Resp:  (!) 22 (!) 21 (!) 24  Temp: 98.1 F (36.7 C)     TempSrc:      SpO2:  96% 98% 98%  Weight:      Height:        Intake/Output Summary (Last 24 hours) at 03/21/17 1259 Last data filed at 03/21/17 0504  Gross per 24 hour  Intake             1000 ml  Output                0 ml  Net             1000 ml   Filed Weights   03/20/17 2112  Weight: 99.8 kg (220 lb)    Examination:  General exam: Appears calm and comfortable  Respiratory system: Clear to auscultation. Respiratory effort normal. Cardiovascular system: S1 & S2 heard, RRR. No JVD, murmurs, rubs, gallops or clicks. No pedal edema. Gastrointestinal system: Abdomen is nondistended, soft and nontender. No organomegaly or masses felt. Normal bowel sounds heard. Central nervous system: Alert and oriented. No focal neurological deficits.  Extremities: Symmetric 5 x 5 power. Skin: No rashes, lesions or ulcers Psychiatry: Judgement and insight appear normal. Mood & affect appropriate.     Data Reviewed: I have personally reviewed following labs and imaging studies  CBC:  Recent Labs Lab 03/18/17 0849 03/20/17 2114 03/21/17 0444  WBC 8.3 10.6* 8.1  NEUTROABS 5.7  --  5.7  HGB  --  11.4* 11.0*  HCT 34.4* 33.6* 32.1*  MCV 100* 99.4 99.4  PLT 252 245 502   Basic Metabolic Panel:  Recent Labs Lab 03/18/17 0849 03/20/17 2114 03/21/17 0444  NA 133* 132* 132*  K 4.3 4.1 3.7  CL 89*  90* 97*  CO2 29 26 25   GLUCOSE 88 85 77  BUN 59* 54* 51*  CREATININE 2.05* 2.27* 2.05*  CALCIUM 10.0 9.6 8.6*   GFR: Estimated Creatinine Clearance: 39.9 mL/min (A) (by C-G formula based on SCr of 2.05 mg/dL (H)). Liver Function Tests:  Recent Labs Lab 03/18/17 0849 03/20/17 2114 03/21/17 0444  AST 21 27 22   ALT 16 17 16*  ALKPHOS 267* 224* 205*  BILITOT 1.6* 2.3* 2.1*  PROT 7.3 7.5 6.7  ALBUMIN 3.1* 2.9* 2.5*    Recent Labs Lab 03/20/17 2114  LIPASE 41    Recent Labs Lab 03/18/17 0849 03/20/17 2114  AMMONIA 36 23   Coagulation Profile:  Recent Labs Lab 03/18/17 0820 03/20/17 1203 03/21/17 0452  INR 6.2* 4.3* 3.72   Cardiac Enzymes:  Recent Labs Lab 03/21/17 0444 03/21/17 1036  TROPONINI 0.03* <0.03   BNP (last 3 results) No results for input(s): PROBNP in the last 8760 hours. HbA1C: No results for input(s): HGBA1C in the last 72 hours. CBG:  Recent Labs Lab 03/20/17 2113 03/20/17 2239 03/20/17 2331 03/21/17 0217  GLUCAP 81 99 116* 102*   Lipid Profile: No results for input(s): CHOL, HDL, LDLCALC, TRIG, CHOLHDL, LDLDIRECT in the last 72 hours. Thyroid Function Tests:  Recent Labs  03/21/17 0444  TSH 2.724   Anemia Panel: No results for input(s): VITAMINB12, FOLATE, FERRITIN, TIBC, IRON, RETICCTPCT in the last 72 hours. Sepsis Labs:  Recent Labs Lab 03/20/17 2354 03/21/17 0444 03/21/17 1036  LATICACIDVEN 1.45 1.1 1.4    No results found for this or any previous visit (from the past 240 hour(s)).       Radiology Studies: Ct Abdomen Pelvis Wo Contrast  Result Date: 03/21/2017 CLINICAL DATA:  Abdominal pain and history of cirrhosis EXAM: CT ABDOMEN AND PELVIS WITHOUT CONTRAST TECHNIQUE: Multidetector CT imaging of the abdomen and pelvis was performed following the standard protocol without IV contrast. COMPARISON:  Abdominal ultrasound 01/20/2017 CT abdomen pelvis 01/20/2017 FINDINGS: Lower chest: There is bilateral  gynecomastia. There are bilateral small pleural effusions and associated atelectasis. There is marked cardiomegaly with a pericardial effusion measuring up to 1.8 cm in thickness, new compared to the prior study. Hepatobiliary: There is a nodular hepatic contour with relative hypertrophy of the caudate and left hepatic lobe. There is a small amount of perihepatic ascites. The gallbladder contents are hyperdense. No biliary dilatation. Pancreas: Normal pancreatic contours. No peripancreatic fluid collection or pancreatic ductal dilatation. Spleen: Normal. Adrenals/Urinary Tract: Normal adrenal glands. No hydronephrosis or nephrolithiasis. No abnormal perinephric stranding. No ureteral obstruction. Stomach/Bowel: No abnormal bowel dilatation. No bowel wall thickening or adjacent fat stranding to indicate acute inflammation. No abdominal fluid collection. The appendix is not clearly seen. Vascular/Lymphatic: There is atherosclerotic calcification of the non aneurysmal abdominal aorta. No abdominal or pelvic adenopathy. Reproductive: Normal prostate and seminal  vesicles. Musculoskeletal: No lytic or blastic osseous lesion. No bony spinal canal stenosis. Multilevel thoracolumbar facet arthrosis and osteophytosis. Other: Small amount of perisplenic ascites. IMPRESSION: 1. Findings of hepatic cirrhosis with small amount of perihepatic ascites. 2. Pericardial effusion measuring up to 18 mm, new from the study of 01/20/2017. 3. Bilateral small pleural effusions with associated atelectasis. 4. Aortic atherosclerosis and cardiomegaly. Electronically Signed   By: Ulyses Jarred M.D.   On: 03/21/2017 03:54   Dg Chest 2 View  Result Date: 03/20/2017 CLINICAL DATA:  Weakness.  Nausea and vomiting. EXAM: CHEST  2 VIEW COMPARISON:  Chest radiograph 03/05/2017 FINDINGS: Unchanged cardiomegaly. Unchanged left basilar atelectasis. No focal consolidation otherwise. No pneumothorax or pleural effusion. No pulmonary edema. IMPRESSION:  Unchanged appearance of left basilar atelectasis. Electronically Signed   By: Ulyses Jarred M.D.   On: 03/20/2017 22:33   Ct Head Wo Contrast  Result Date: 03/21/2017 CLINICAL DATA:  Confusion EXAM: CT HEAD WITHOUT CONTRAST TECHNIQUE: Contiguous axial images were obtained from the base of the skull through the vertex without intravenous contrast. COMPARISON:  None. FINDINGS: Brain: No mass lesion, intraparenchymal hemorrhage or extra-axial collection. No evidence of acute cortical infarct. There is periventricular hypoattenuation compatible with chronic microvascular disease. There is cerebral volume loss without lobar predilection. Vascular: No hyperdense vessel or unexpected calcification. Skull: Normal visualized skull base, calvarium and extracranial soft tissues. Sinuses/Orbits: No sinus fluid levels or advanced mucosal thickening. No mastoid effusion. Normal orbits. IMPRESSION: 1. No acute intracranial abnormality. 2. Chronic microvascular ischemia and cerebral volume loss. Electronically Signed   By: Ulyses Jarred M.D.   On: 03/21/2017 03:32   Mr Brain Wo Contrast  Result Date: 03/21/2017 CLINICAL DATA:  Spells of speech arrest.  Periods of confusion. EXAM: MRI HEAD WITHOUT CONTRAST TECHNIQUE: Multiplanar, multiecho pulse sequences of the brain and surrounding structures were obtained without intravenous contrast. COMPARISON:  CT head without contrast 03/21/2017. FINDINGS: Brain: A punctate area diffusion abnormality is present within the posterior left pons. The coronal diffusion images skin over this area. No significant cortical infarct is present. Mild atrophy is otherwise within normal limits for age. There is no significant white matter disease. The brainstem and cerebellum are otherwise within normal limits. T2 changes are associated with the area noted on the diffusion series. Internal auditory canals are within normal limits. Vascular: Flow is present in the major intracranial arteries. Skull  and upper cervical spine: The skullbase is within normal limits. Midline sagittal structures R within normal limits. The upper cervical spine is unremarkable. The craniocervical junction is normal. Sinuses/Orbits: Mild mucosal thickening is present within the maxillary sinuses and anterior ethmoid air cells bilaterally, left greater than right. There is minimal mucosal thickening within the frontal sinuses. The sphenoid sinuses are clear. Minimal fluid is present in the inferior mastoid air cells bilaterally. No obstructing nasopharyngeal lesion is present. IMPRESSION: 1. Question punctate subacute white matter infarct in the posterior left pons. 2. No other acute or focal abnormality to explain the patient's symptoms. No acute or focal cortical infarcts. 3. Mild anterior sinus disease. Electronically Signed   By: San Morelle M.D.   On: 03/21/2017 09:52        Scheduled Meds: . allopurinol  300 mg Oral Daily  . amiodarone  200 mg Oral BID  . calcitRIOL  0.5 mcg Oral Daily  . colchicine  0.6 mg Oral BID  . febuxostat  40 mg Oral Daily  . insulin aspart  0-9 Units Subcutaneous TID WC  .  lacosamide (VIMPAT) IV  100 mg Intravenous Q12H  . lactulose  20 g Oral BID  . pantoprazole  40 mg Oral BID AC  . torsemide  20 mg Oral BID   Continuous Infusions: . cefTRIAXone (ROCEPHIN)  IV       LOS: 0 days      Gladiola Madore Gerome Apley, MD [facility] Pager [phone]  If 7PM-7AM, please contact night-coverage [email or web address] Password [facility] 03/21/2017, 12:59 PM

## 2017-03-21 NOTE — Progress Notes (Signed)
CRITICAL VALUE ALERT  Critical value received:  Troponin = 0.03  Date of notification:  03/21/2017  Time of notification:  18:30  Critical value read back:Yes.    Nurse who received alert:  Alphonsus Sias  MD notified (1st page):  Dr. Cathlean Sauer  Time of first page:  18:31  MD notified (2nd page): Dr. Cathlean Sauer  Time of second page: 18:37  Responding MD:  n/a  Time MD responded:  n/a

## 2017-03-21 NOTE — ED Notes (Signed)
Pt to MRI

## 2017-03-21 NOTE — ED Notes (Signed)
Pt states he is claustrophobic now-- when getting ready to transport to MRI-- Admitting dr paged for meds.

## 2017-03-21 NOTE — ED Notes (Signed)
Verbal from neuro Leonel Ramsay MD) for 1mg  Ativan IV

## 2017-03-21 NOTE — ED Notes (Signed)
EEG at bedside.

## 2017-03-21 NOTE — H&P (Signed)
History and Physical    Melvin Wang:[id number] DOB: 07-Sep-1941 DOA: 03/20/2017  PCP: Claretta Fraise, MD  Patient coming from: Home.  Chief Complaint: Periods of confusion.  HPI: Melvin Wang is a 76 y.o. 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 Patient's wife states that patient have periods of confusion where patient talks something unrelated. Yesterday patient was standing and was refusing to go back to the bed. EMS was called and patient was brought to the ER.   ED Course: Ammonia levels are normal the ER. Patient had at least 3 episodes in the ER where patient's eyes were fluttering. Each episodes lasted for around 3-4 minutes and during which patient followed commands but did not talk. Patient did not have any tongue bite or postictal phase or incontinence of urine. Neurologist on-call was consulted and during the consult patient had another episode. Plan is to place patient empirically on moving patch for possible seizures. CT head is unremarkable. Since patient had abnormal discomfort on exam CT abdomen was done which shows new pericardial effusion. Patient also was hypotensive with tachycardia for which fluid bolus was given and since patient has new pericardial effusion cardiology was consulted concerning for tamponade.  Review of Systems: As per HPI, rest all negative.   Past Medical History:  Diagnosis Date  . Abnormality of gait   . Cataract   . Cirrhosis of liver with ascites ([place]) 01/19/2017  . CKD (chronic kidney disease), stage III     Kidney  . Cor pulmonale (HCC)    Possible  . Essential hypertension   . Essential tremor   . Foot ulcer,  right ([place]) 03/2013  . Gout   . Hyperlipidemia   . Permanent atrial fibrillation (HCC)    a. >20 years, prior DCCVs unsuccessful, INR followed by PCP.  Marland Kitchen Type 2 diabetes mellitus ([place])     Past Surgical History:  Procedure Laterality Date  . CATARACT EXTRACTION W/PHACO Left 04/19/2013   Procedure: CATARACT EXTRACTION PHACO AND INTRAOCULAR LENS PLACEMENT (IOC);  Surgeon: Tonny Branch, MD;  Location: [facility];  Service: Ophthalmology;  Laterality: Left;  CDE 29.30  . CATARACT EXTRACTION W/PHACO Right 07/15/2013   Procedure: CATARACT EXTRACTION PHACO AND INTRAOCULAR LENS PLACEMENT (IOC);  Surgeon: Tonny Branch, MD;  Location: [facility];  Service: Ophthalmology;  Laterality: Right;  CDE:12.84  . ESOPHAGOGASTRODUODENOSCOPY N/A 01/21/2017   Dr. Oneida Alar: mild erosive gastritis and moderate erosive duodenitis (negative H.pylori). Multiple gastric polyps.   Marland Kitchen RIGHT/LEFT HEART CATH AND CORONARY ANGIOGRAPHY N/A 03/04/2017   Procedure: Right/Left Heart Cath and Coronary Angiography;  Surgeon: Jolaine Artist, MD;  Location: [facility];  Service: Cardiovascular;  Laterality: N/A;     reports that he quit smoking about 32 years ago. His smoking use included Cigarettes. He has a 15.00 pack-year smoking history. He has never used smokeless tobacco. He reports that he does not drink alcohol or use drugs.  Allergies  Allergen Reactions  . Acyclovir And Related Hives  . Metformin And Related Diarrhea  . Amoxicillin Hives and Other (See Comments)    Has patient had a PCN reaction causing immediate rash, facial/tongue/throat swelling, SOB or lightheadedness with hypotension: No Has patient  had a PCN reaction causing severe rash involving mucus membranes or skin necrosis: No Has patient had a PCN reaction that required hospitalization No Has patient had a PCN reaction occurring within the last 10 years: No If all of the above answers are "NO", then may proceed with Cephalosporin use.  . Augmentin  [Amoxicillin-Pot Clavulanate] Hives and Other (See Comments)    Has patient had a PCN reaction causing immediate rash, facial/tongue/throat swelling, SOB or lightheadedness with hypotension: No Has patient had a PCN reaction causing severe rash involving mucus membranes or skin necrosis: No Has patient had a PCN reaction that required hospitalization No Has patient had a PCN reaction occurring within the last 10 years: No If all of the above answers are "NO", then may proceed with Cephalosporin use.  [name] [Trandolapril] Other (See Comments)    Reaction:  Caused renal problems    Family History  Problem Relation Age of Onset  . Colon cancer Father 74    Deceased age 62   . Cancer Paternal Uncle   . Liver disease Neg Hx     Prior to Admission medications   Medication Sig Start Date End Date Taking? Authorizing Provider  allopurinol (ZYLOPRIM) 300 MG tablet Take 300 mg by mouth daily.   Yes Historical Provider, MD  amiodarone (PACERONE) 200 MG tablet Take 1 tablet (200 mg total) by mouth 2 (two) times daily. 03/07/17  Yes Theodis Blaze, MD  B-D UF III MINI PEN NEEDLES 31G X 5 MM MISC USE 1 TIME DAILY WITH LANTUS SOLOSTAR 03/13/17  Yes Claretta Fraise, MD  calcitRIOL (ROCALTROL) 0.5 MCG capsule Take 0.5 mcg by mouth daily.   Yes Historical Provider, MD  febuxostat (ULORIC) 40 MG tablet Take 1 tablet (40 mg total) by mouth daily. 02/21/17  Yes Claretta Fraise, MD  Insulin Glargine (LANTUS SOLOSTAR) 100 UNIT/ML Solostar Pen Inject 10 Units into the skin every morning. Please call your provider if glucose levels persistently  < 70 03/07/17  Yes Theodis Blaze, MD  Lactulose 20 GM/30ML SOLN Take 30 mLs (20 g total) by mouth 2 (two) times daily. 03/13/17  Yes Shirley Friar, PA-C  pantoprazole (PROTONIX) 40 MG tablet TAKE 1 TABLET (40 MG TOTAL) BY MOUTH 2 (TWO) TIMES DAILY BEFORE A MEAL. 03/10/17  Yes Claretta Fraise, MD  propranolol (INDERAL) 20 MG tablet Take 1 tablet (20 mg total) by mouth 2 (two)  times daily. 03/13/17  Yes Shirley Friar, PA-C  torsemide (DEMADEX) 20 MG tablet Take 1 tablet (20 mg total) by mouth 2 (two) times daily. 03/16/17  Yes Shirley Friar, PA-C  warfarin (COUMADIN) 2.5 MG tablet Take 1 tablet Tuesday, Wednesday, Thursday, Saturday and Sunday. Patient taking differently: Take 2.5 mg by mouth daily.  03/19/17  Yes Claretta Fraise, MD    Physical Exam: Vitals:   03/21/17 0330 03/21/17 0340 03/21/17 0400 03/21/17 0430  BP: (!) 88/55 (!) 89/63 (!) 81/54 (!) 93/56  Pulse: 71 (!) 122 96 (!) 50  Resp: (!) 22 18 20 20   Temp:      TempSrc:      SpO2: 93% (!) 85% (!) 89% 98%  Weight:      Height:          Constitutional: Moderately built and nourished. Vitals:   03/21/17 0330 03/21/17 0340 03/21/17 0400 03/21/17 0430  BP: (!) 88/55 (!) 89/63 (!) 81/54 (!) 93/56  Pulse: 71 (!) 122 96 (!) 50  Resp: (!) 22 18 20  20  Temp:      TempSrc:      SpO2: 93% (!) 85% (!) 89% 98%  Weight:      Height:       Eyes: Anicteric no pallor. ENMT: No discharge from the ears eyes nose and mouth. Neck: No mass felt. No JVD appreciated. Respiratory: No rhonchi or crepitations. Cardiovascular: S1-S2 heard no murmurs appreciated. Abdomen: Soft nontender bowel sounds present. Musculoskeletal: No edema. No joint effusion. Skin: No rash. Skin appears warm. Neurologic: Alert awake oriented to time place and person. Moves all extremities. Psychiatric: Appears normal.   Labs on Admission: I have personally reviewed following labs and imaging studies  CBC:  Recent Labs Lab 03/18/17 0849 03/20/17 2114  WBC 8.3 10.6*  NEUTROABS 5.7  --   HGB  --  11.4*  HCT 34.4* 33.6*  MCV 100* 99.4  PLT 252 696   Basic Metabolic Panel:  Recent Labs Lab 03/18/17 0849 03/20/17 2114  NA 133* 132*  K 4.3 4.1  CL 89* 90*  CO2 29 26  GLUCOSE 88 85  BUN 59* 54*  CREATININE 2.05* 2.27*  CALCIUM 10.0 9.6   GFR: Estimated Creatinine Clearance: 36 mL/min (A) (by C-G  formula based on SCr of 2.27 mg/dL (H)). Liver Function Tests:  Recent Labs Lab 03/18/17 0849 03/20/17 2114  AST 21 27  ALT 16 17  ALKPHOS 267* 224*  BILITOT 1.6* 2.3*  PROT 7.3 7.5  ALBUMIN 3.1* 2.9*    Recent Labs Lab 03/20/17 2114  LIPASE 41    Recent Labs Lab 03/18/17 0849 03/20/17 2114  AMMONIA 36 23   Coagulation Profile:  Recent Labs Lab 03/18/17 0820 03/20/17 1203  INR 6.2* 4.3*   Cardiac Enzymes: No results for input(s): CKTOTAL, CKMB, CKMBINDEX, TROPONINI in the last 168 hours. BNP (last 3 results) No results for input(s): PROBNP in the last 8760 hours. HbA1C: No results for input(s): HGBA1C in the last 72 hours. CBG:  Recent Labs Lab 03/20/17 2113 03/20/17 2239 03/20/17 2331 03/21/17 0217  GLUCAP 81 99 116* 102*   Lipid Profile: No results for input(s): CHOL, HDL, LDLCALC, TRIG, CHOLHDL, LDLDIRECT in the last 72 hours. Thyroid Function Tests: No results for input(s): TSH, T4TOTAL, FREET4, T3FREE, THYROIDAB in the last 72 hours. Anemia Panel: No results for input(s): VITAMINB12, FOLATE, FERRITIN, TIBC, IRON, RETICCTPCT in the last 72 hours. Urine analysis:    Component Value Date/Time   COLORURINE YELLOW 03/21/2017 0001   APPEARANCEUR CLEAR 03/21/2017 0001   APPEARANCEUR Clear 09/28/2013 0845   LABSPEC 1.010 03/21/2017 0001   PHURINE 5.0 03/21/2017 0001   GLUCOSEU NEGATIVE 03/21/2017 0001   HGBUR NEGATIVE 03/21/2017 0001   BILIRUBINUR NEGATIVE 03/21/2017 0001   BILIRUBINUR neg 12/07/2015 0939   BILIRUBINUR Negative 09/28/2013 0845   KETONESUR NEGATIVE 03/21/2017 0001   PROTEINUR NEGATIVE 03/21/2017 0001   UROBILINOGEN negative 12/07/2015 0939   UROBILINOGEN 0.2 02/19/2014 1612   NITRITE NEGATIVE 03/21/2017 0001   LEUKOCYTESUR NEGATIVE 03/21/2017 0001   LEUKOCYTESUR Negative 09/28/2013 0845   Sepsis Labs: @LABRCNTIP (procalcitonin:4,lacticidven:4) )No results found for this or any previous visit (from the past 240 hour(s)).    Radiological Exams on Admission: Ct Abdomen Pelvis Wo Contrast  Result Date: 03/21/2017 CLINICAL DATA:  Abdominal pain and history of cirrhosis EXAM: CT ABDOMEN AND PELVIS WITHOUT CONTRAST TECHNIQUE: Multidetector CT imaging of the abdomen and pelvis was performed following the standard protocol without IV contrast. COMPARISON:  Abdominal ultrasound 01/20/2017 CT abdomen pelvis 01/20/2017 FINDINGS: Lower chest: There is bilateral gynecomastia.  There are bilateral small pleural effusions and associated atelectasis. There is marked cardiomegaly with a pericardial effusion measuring up to 1.8 cm in thickness, new compared to the prior study. Hepatobiliary: There is a nodular hepatic contour with relative hypertrophy of the caudate and left hepatic lobe. There is a small amount of perihepatic ascites. The gallbladder contents are hyperdense. No biliary dilatation. Pancreas: Normal pancreatic contours. No peripancreatic fluid collection or pancreatic ductal dilatation. Spleen: Normal. Adrenals/Urinary Tract: Normal adrenal glands. No hydronephrosis or nephrolithiasis. No abnormal perinephric stranding. No ureteral obstruction. Stomach/Bowel: No abnormal bowel dilatation. No bowel wall thickening or adjacent fat stranding to indicate acute inflammation. No abdominal fluid collection. The appendix is not clearly seen. Vascular/Lymphatic: There is atherosclerotic calcification of the non aneurysmal abdominal aorta. No abdominal or pelvic adenopathy. Reproductive: Normal prostate and seminal vesicles. Musculoskeletal: No lytic or blastic osseous lesion. No bony spinal canal stenosis. Multilevel thoracolumbar facet arthrosis and osteophytosis. Other: Small amount of perisplenic ascites. IMPRESSION: 1. Findings of hepatic cirrhosis with small amount of perihepatic ascites. 2. Pericardial effusion measuring up to 18 mm, new from the study of 01/20/2017. 3. Bilateral small pleural effusions with associated atelectasis. 4.  Aortic atherosclerosis and cardiomegaly. Electronically Signed   By: Ulyses Jarred M.D.   On: 03/21/2017 03:54   Dg Chest 2 View  Result Date: 03/20/2017 CLINICAL DATA:  Weakness.  Nausea and vomiting. EXAM: CHEST  2 VIEW COMPARISON:  Chest radiograph 03/05/2017 FINDINGS: Unchanged cardiomegaly. Unchanged left basilar atelectasis. No focal consolidation otherwise. No pneumothorax or pleural effusion. No pulmonary edema. IMPRESSION: Unchanged appearance of left basilar atelectasis. Electronically Signed   By: Ulyses Jarred M.D.   On: 03/20/2017 22:33   Ct Head Wo Contrast  Result Date: 03/21/2017 CLINICAL DATA:  Confusion EXAM: CT HEAD WITHOUT CONTRAST TECHNIQUE: Contiguous axial images were obtained from the base of the skull through the vertex without intravenous contrast. COMPARISON:  None. FINDINGS: Brain: No mass lesion, intraparenchymal hemorrhage or extra-axial collection. No evidence of acute cortical infarct. There is periventricular hypoattenuation compatible with chronic microvascular disease. There is cerebral volume loss without lobar predilection. Vascular: No hyperdense vessel or unexpected calcification. Skull: Normal visualized skull base, calvarium and extracranial soft tissues. Sinuses/Orbits: No sinus fluid levels or advanced mucosal thickening. No mastoid effusion. Normal orbits. IMPRESSION: 1. No acute intracranial abnormality. 2. Chronic microvascular ischemia and cerebral volume loss. Electronically Signed   By: Ulyses Jarred M.D.   On: 03/21/2017 03:32    EKG: Independently reviewed. Atrial fibrillation rate controlled.  Assessment/Plan Principal Problem:   Seizure ([place]) Active Problems:   Essential hypertension   Permanent atrial fibrillation (HCC)   Anemia   Cirrhosis of liver without ascites (HCC)   Type 2 diabetes mellitus with renal complication (HCC)   RVF (right ventricular failure) (HCC)   PAH (pulmonary artery hypertension)   Weakness   Acute encephalopathy     1. Seizures - patient's symptoms are concerning for partial seizures. Discussed with Dr. Leonel Ramsay neurologist on call who advised patient to be placed on with that. Please follow further recommendations per neurologist. EEG. CT head is unremarkable. 2. Hypotension with new pericardial effusion - per cardiologist patient does not have any clinical findings to suggest tamponade. Check repeat 2-D echo. 3. History of end-stage right ventricle failure CHF - holding Lasix due to hypotension. 4. A. fib rate controlled on amiodarone and propranolol. Will continue amiodarone will hold off propranolol l due to hypotension. Coumadin supratherapeutic. 5. History of cirrhosis of liver - ammonia levels  are within acceptable limits. On lactulose. Will keep patient on empiric antibiotics for SBP since patient had abdominal tenderness. If possible once patient is stable but paracentesis. 6. Chronic kidney disease stage III - closely follow creatinine. 7. Diabetes mellitus type 2 - patient is usually on Lantus 10 units at bedtime. I have placed patient on sliding scale will hold off Lantus for now.   DVT prophylaxis: Coumadin. Code Status: Full code.  Family Communication: Patient's wife.  Disposition Plan: To be determined.  Consults called: Neurology and cardiology.  Admission status: Observation.    Rise Patience MD [facility] Pager [phone].  If 7PM-7AM, please contact night-coverage [email or web address] Password [facility]  03/21/2017, 4:38 AM

## 2017-03-21 NOTE — Progress Notes (Signed)
MD was paged twice to be informed about troponin level of 0.03. No new orders of this time. Will continue to monitor.

## 2017-03-21 NOTE — ED Notes (Signed)
Pt placed on 3L [place], sats drop to upper 80's while pt is asleep.

## 2017-03-21 NOTE — ED Notes (Signed)
Pt sleeping, will arouse, but falls back to sleep easily.

## 2017-03-21 NOTE — Consult Note (Signed)
Neurology Consultation Reason for Consult: Spells of confusion Referring Physician: Hal Hope, A  CC: Spells of confusion  History is obtained from: Patient, wife  HPI: NAHUEL Wang is a 76 y.o. male was admitted with an episode of unresponsiveness 2 weeks ago which was felt to be due to blood glucose of 23 at the time. While he was here, he developed atrial fibrillation with rates up to the 160s, started on amiodarone. He had a heart cath with normal coronaries. He was discharged after arriving home, has had recurrent episodes of confusion and unresponsiveness. He attributes these to simply feeling "tired" and thinks that a lot of fuss is being made about nothing.  His wife describes that today, he had an episode where he got off the toilet and walked out half dressed, he then held onto the wall and remain there for what seemed like a half hour and due to him not coming out of it, 911 was called.  While being admitted, he had another one of his episodes of unresponsiveness followed by some confusion and returned normal. For this reason I was consulted, and while is examining him, he was initially oriented and appropriate but then had an episode consisting of fluttering eyes, eyes are midline, head was slightly turn to the left, no abnormal movements of the extremities, speech and behavioral arrest. Following this, he was confused but able to follow commands. During the spell, I have the nurse take a blood pressure which read 366 systolic   ROS: A 14 point ROS was performed and is negative except as noted in the HPI.   Past Medical History:  Diagnosis Date  . Abnormality of gait   . Cataract   . Cirrhosis of liver with ascites ([place]) 01/19/2017  . CKD (chronic kidney disease), stage III    [facility]  . Cor pulmonale (HCC)    Possible  . Essential hypertension   . Essential tremor   . Foot ulcer, right ([place]) 03/2013  . Gout   . Hyperlipidemia   . Permanent atrial fibrillation  (HCC)    a. >20 years, prior DCCVs unsuccessful, INR followed by PCP.  Marland Kitchen Type 2 diabetes mellitus (HCC)      Family History  Problem Relation Age of Onset  . Colon cancer Father 77    Deceased age 41   . Cancer Paternal Uncle   . Liver disease Neg Hx      Social History:  reports that he quit smoking about 32 years ago. His smoking use included Cigarettes. He has a 15.00 pack-year smoking history. He has never used smokeless tobacco. He reports that he does not drink alcohol or use drugs.   Exam: Current vital signs: BP (!) 105/58   Pulse 60   Temp 98.1 F (36.7 C) (Oral)   Resp (!) 21   Ht 6\' 1"  (1.854 m)   Wt 99.8 kg (220 lb)   SpO2 94%   BMI 29.03 kg/m  Vital signs in last 24 hours: Temp:  [98.1 F (36.7 C)] 98.1 F (36.7 C) (04/12 2114) Pulse Rate:  [32-101] 60 (04/13 0230) Resp:  [17-24] 21 (04/13 0230) BP: (80-105)/(50-73) 105/58 (04/13 0230) SpO2:  [94 %-99 %] 94 % (04/13 0230) Weight:  [99.8 kg (220 lb)] 99.8 kg (220 lb) (04/12 2112)   Physical Exam  Constitutional: Appears well-developed and well-nourished.  Psych: Affect appropriate to situation Eyes: No scleral injection HENT: No OP obstrucion Head: Normocephalic.  Cardiovascular: Normal rate and regular rhythm.  Respiratory: Effort normal and breath sounds normal to anterior ascultation GI: Soft.  No distension. There is no tenderness.  Skin: WDI  Neuro: Mental Status: Patient is awake, alert, oriented to person, place, gives month as March or April year, and situation. Patient is able to give a clear and coherent history. No signs of aphasia or neglect Cranial Nerves: II: Visual Fields are full. Pupils are equal, round, and reactive to light.   III,IV, VI: EOMI without ptosis or diploplia.  V: Facial sensation is symmetric to temperature VII: Facial movement is symmetric.  VIII: hearing is intact to voice X: Uvula elevates symmetrically XI: Shoulder shrug is symmetric. XII: tongue is  midline without atrophy or fasciculations.  Motor: Tone is normal. Bulk is normal. 5/5 strength was present in all four extremities. The limited and legs due to pain Sensory: Sensation is symmetric to light touch and temperature in the arms and legs. Cerebellar: No clear ataxia but he has a prominent tremor bilaterally.      I have reviewed labs in epic and the results pertinent to this consultation are: Elevated creatinine  I have reviewed the images obtained: CT head-unremarkable  Impression: 76 year old male with recurrent episodes of confusion. The episode that I saw is concerning for partial seizure, though I am not sure if I can be definite based on clinical grounds alone. I do have enough concern that with recurrent episodes, I would favor starting antiepileptic therapy.  If the spells continue, however, prior to aggressively chasing them down I do think that capturing 1 on EEG would be helpful.  Recommendations: 1) Vimpat 200 mg 1 then 100 mg twice a day 2) EEG 3) MRI brain  Roland Rack, MD [facility] [phone]  If 7pm- 7am, please page neurology on call as listed in [place].

## 2017-03-21 NOTE — ED Notes (Signed)
Echo at bedside

## 2017-03-21 NOTE — ED Notes (Signed)
Cardiology at bedside.

## 2017-03-21 NOTE — ED Notes (Signed)
Pt to CT with this RN.

## 2017-03-21 NOTE — Progress Notes (Signed)
Per MD verbal order will hold Torsemide tonight for low BP. Will continue to monitor.

## 2017-03-21 NOTE — Progress Notes (Signed)
Advanced Heart Failure Rounding Note  PCP: Claretta Fraise, MD Primary Cardiologist:   Subjective:    Admitted overnight 03/21/17 with AMS. Neurologist saw in ED and witnessed episode of eye fluttering and AMS concerning for partial seizure. MRI and EEG ordered. CT head unremarkable  CT/abd pelvis ordered with abdominal discomfort. Unremarkable apart from new finding of circumferential pericardial effusion with maximum width of 18 mm, Not previously noted on echo.  HF team consulted.   Pt feeling near his baseline now.  Denies SOB or CP. Hasn't had any further episodes since last night. Has been taking all medications as directed.  States lactulose makes him nauseated.  Ammonia 23 03/20/17  MRI this am ? Punctate subacute white matter infarct in the posterior pons. No other acute abnormality.   Objective:   Weight Range: 220 lb (99.8 kg) Body mass index is 29.03 kg/m.   Vital Signs:   Temp:  [98.1 F (36.7 C)] 98.1 F (36.7 C) (04/13 0945) Pulse Rate:  [32-101] 69 (04/13 0730) Resp:  [15-24] 19 (04/13 0730) BP: (80-105)/(50-76) 99/57 (04/13 0730) SpO2:  [93 %-99 %] 98 % (04/13 0730) Weight:  [220 lb (99.8 kg)] 220 lb (99.8 kg) (04/12 2112)    Weight change: Filed Weights   03/20/17 2112  Weight: 220 lb (99.8 kg)    Intake/Output:   Intake/Output Summary (Last 24 hours) at 03/21/17 1004 Last data filed at 03/21/17 0504  Gross per 24 hour  Intake             1000 ml  Output                0 ml  Net             1000 ml     Physical Exam: General:  Elderly and fatigued appearing caucasian male in NAD. + resting tremor.  HEENT: Normal Neck: Supple. JVP to jaw. Carotids 2+ bilat; no bruits. No lymphadenopathy or thyromegaly appreciated. Cor: PMI nondisplaced. Irregularly irregular. 2/6 TR and + RV lift. + friction rub.  Lungs: Clear, normal effort.   Abdomen: soft, nontender, nondistended. No hepatosplenomegaly. No bruits or masses. Good bowel sounds. Extremities:  No cyanosis, clubbing, or rash. Trace to 1+ edema.  Neuro: alert & orientedx3, cranial nerves grossly intact. moves all 4 extremities w/o difficulty. Affect pleasant + resting tremor. ? Mild component of asterixis.  Telemetry: Personally reviewed, Afib 90-100s  Labs: CBC  Recent Labs  03/20/17 2114 03/21/17 0444  WBC 10.6* 8.1  NEUTROABS  --  5.7  HGB 11.4* 11.0*  HCT 33.6* 32.1*  MCV 99.4 99.4  PLT 245 998   Basic Metabolic Panel  Recent Labs  03/20/17 2114 03/21/17 0444  NA 132* 132*  K 4.1 3.7  CL 90* 97*  CO2 26 25  GLUCOSE 85 77  BUN 54* 51*  CREATININE 2.27* 2.05*  CALCIUM 9.6 8.6*   Liver Function Tests  Recent Labs  03/20/17 2114 03/21/17 0444  AST 27 22  ALT 17 16*  ALKPHOS 224* 205*  BILITOT 2.3* 2.1*  PROT 7.5 6.7  ALBUMIN 2.9* 2.5*    Recent Labs  03/20/17 2114  LIPASE 41   Cardiac Enzymes  Recent Labs  03/21/17 0444  TROPONINI 0.03*    BNP: BNP (last 3 results) No results for input(s): BNP in the last 8760 hours.  ProBNP (last 3 results) No results for input(s): PROBNP in the last 8760 hours.   D-Dimer No results for input(s): DDIMER in  the last 72 hours. Hemoglobin A1C No results for input(s): HGBA1C in the last 72 hours. Fasting Lipid Panel No results for input(s): CHOL, HDL, LDLCALC, TRIG, CHOLHDL, LDLDIRECT in the last 72 hours. Thyroid Function Tests  Recent Labs  03/21/17 0444  TSH 2.724    Other results:     Imaging/Studies:  Ct Abdomen Pelvis Wo Contrast  Result Date: 03/21/2017 CLINICAL DATA:  Abdominal pain and history of cirrhosis EXAM: CT ABDOMEN AND PELVIS WITHOUT CONTRAST TECHNIQUE: Multidetector CT imaging of the abdomen and pelvis was performed following the standard protocol without IV contrast. COMPARISON:  Abdominal ultrasound 01/20/2017 CT abdomen pelvis 01/20/2017 FINDINGS: Lower chest: There is bilateral gynecomastia. There are bilateral small pleural effusions and associated atelectasis.  There is marked cardiomegaly with a pericardial effusion measuring up to 1.8 cm in thickness, new compared to the prior study. Hepatobiliary: There is a nodular hepatic contour with relative hypertrophy of the caudate and left hepatic lobe. There is a small amount of perihepatic ascites. The gallbladder contents are hyperdense. No biliary dilatation. Pancreas: Normal pancreatic contours. No peripancreatic fluid collection or pancreatic ductal dilatation. Spleen: Normal. Adrenals/Urinary Tract: Normal adrenal glands. No hydronephrosis or nephrolithiasis. No abnormal perinephric stranding. No ureteral obstruction. Stomach/Bowel: No abnormal bowel dilatation. No bowel wall thickening or adjacent fat stranding to indicate acute inflammation. No abdominal fluid collection. The appendix is not clearly seen. Vascular/Lymphatic: There is atherosclerotic calcification of the non aneurysmal abdominal aorta. No abdominal or pelvic adenopathy. Reproductive: Normal prostate and seminal vesicles. Musculoskeletal: No lytic or blastic osseous lesion. No bony spinal canal stenosis. Multilevel thoracolumbar facet arthrosis and osteophytosis. Other: Small amount of perisplenic ascites. IMPRESSION: 1. Findings of hepatic cirrhosis with small amount of perihepatic ascites. 2. Pericardial effusion measuring up to 18 mm, new from the study of 01/20/2017. 3. Bilateral small pleural effusions with associated atelectasis. 4. Aortic atherosclerosis and cardiomegaly. Electronically Signed   By: Ulyses Jarred M.D.   On: 03/21/2017 03:54   Dg Chest 2 View  Result Date: 03/20/2017 CLINICAL DATA:  Weakness.  Nausea and vomiting. EXAM: CHEST  2 VIEW COMPARISON:  Chest radiograph 03/05/2017 FINDINGS: Unchanged cardiomegaly. Unchanged left basilar atelectasis. No focal consolidation otherwise. No pneumothorax or pleural effusion. No pulmonary edema. IMPRESSION: Unchanged appearance of left basilar atelectasis. Electronically Signed   By: Ulyses Jarred M.D.   On: 03/20/2017 22:33   Ct Head Wo Contrast  Result Date: 03/21/2017 CLINICAL DATA:  Confusion EXAM: CT HEAD WITHOUT CONTRAST TECHNIQUE: Contiguous axial images were obtained from the base of the skull through the vertex without intravenous contrast. COMPARISON:  None. FINDINGS: Brain: No mass lesion, intraparenchymal hemorrhage or extra-axial collection. No evidence of acute cortical infarct. There is periventricular hypoattenuation compatible with chronic microvascular disease. There is cerebral volume loss without lobar predilection. Vascular: No hyperdense vessel or unexpected calcification. Skull: Normal visualized skull base, calvarium and extracranial soft tissues. Sinuses/Orbits: No sinus fluid levels or advanced mucosal thickening. No mastoid effusion. Normal orbits. IMPRESSION: 1. No acute intracranial abnormality. 2. Chronic microvascular ischemia and cerebral volume loss. Electronically Signed   By: Ulyses Jarred M.D.   On: 03/21/2017 03:32   Mr Brain Wo Contrast  Result Date: 03/21/2017 CLINICAL DATA:  Spells of speech arrest.  Periods of confusion. EXAM: MRI HEAD WITHOUT CONTRAST TECHNIQUE: Multiplanar, multiecho pulse sequences of the brain and surrounding structures were obtained without intravenous contrast. COMPARISON:  CT head without contrast 03/21/2017. FINDINGS: Brain: A punctate area diffusion abnormality is present within the posterior left pons.  The coronal diffusion images skin over this area. No significant cortical infarct is present. Mild atrophy is otherwise within normal limits for age. There is no significant white matter disease. The brainstem and cerebellum are otherwise within normal limits. T2 changes are associated with the area noted on the diffusion series. Internal auditory canals are within normal limits. Vascular: Flow is present in the major intracranial arteries. Skull and upper cervical spine: The skullbase is within normal limits. Midline sagittal  structures R within normal limits. The upper cervical spine is unremarkable. The craniocervical junction is normal. Sinuses/Orbits: Mild mucosal thickening is present within the maxillary sinuses and anterior ethmoid air cells bilaterally, left greater than right. There is minimal mucosal thickening within the frontal sinuses. The sphenoid sinuses are clear. Minimal fluid is present in the inferior mastoid air cells bilaterally. No obstructing nasopharyngeal lesion is present. IMPRESSION: 1. Question punctate subacute white matter infarct in the posterior left pons. 2. No other acute or focal abnormality to explain the patient's symptoms. No acute or focal cortical infarcts. 3. Mild anterior sinus disease. Electronically Signed   By: San Morelle M.D.   On: 03/21/2017 09:52       Medications:     Scheduled Medications: . allopurinol  300 mg Oral Daily  . amiodarone  200 mg Oral BID  . calcitRIOL  0.5 mcg Oral Daily  . colchicine  0.6 mg Oral BID  . febuxostat  40 mg Oral Daily  . insulin aspart  0-9 Units Subcutaneous TID WC  . lacosamide (VIMPAT) IV  100 mg Intravenous Q12H  . lactulose  20 g Oral BID  . pantoprazole  40 mg Oral BID AC     Infusions: . cefTRIAXone (ROCEPHIN)  IV       PRN Medications:  acetaminophen **OR** acetaminophen   Assessment/Plan   Melvin Wang is a 76 y.o. male with PMH of Diastolic CHF with end-stage RV failure, Chronic afib, CKD stage III, DM2, resting tremor, and cirrhosis who presented to [facility] after episode of confusion. Incidental pericardial effusion noted on CT ABD/Pelvis  1. AMS with seizure like activity - MRI 03/21/17 with ? Punctate subacute white matter infarct in the posterior pons. No other acute abnormality.  - Neuro following. EEG planned for today. 2. Pericardial effusion - New. Noted incidentally on CT ABD/pel.  At its widest point is 18 mm. - + friction rub on exam. Will start colchicine.  Have ordered stat echo. 3.  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 JVP chronically elevated with RV failure. - Resume torsemide 20 mg BID.  - Continue current medications 4. Chronic Afib - Continue amiodarone 200 mg BID for rate control.  - Rates relatively well controlled.  - Of note, Most recent INR 4.3 and had to hold coumadin earlier this week - Continue coumadin.  5. CKD stage III - Stable. Follow closely with med adjustments.   Stat echo pending to further evaluate pericardial effusion.   Length of Stay: 0  Annamaria Helling  03/21/2017, 10:04 AM  Advanced Heart Failure Team Pager [phone] (M-F; 7a - 4p)  Please contact [facility] for night-coverage after hours (4p -7a ) and weekends on [email or web address]  Patient seen with PA, agree with the above note.    End stage diastolic CHF with RV failure.  Volume status appears fairly stable for him.  Has elevated JVP but severe RV failure so normal for hime. Breathing has been at  baseline.   - He will continue home torsemide 20 mg bid for now.   Incidentally noted pericardial effusion on abdominal CT.  Doubt tamponade clinically, elevated JVP is chronic, BP stable, no pulsus paradoxus.  Interestingly, he has a pericardial friction rub.  Suspect acute pericarditis with effusion, unclear cause (?viral).  Needs echo today to evaluate effusion.   Spells he was having at home may be partial complex seizures.  Neurology following, getting EEG.   Loralie Champagne 03/21/2017 1:17 PM

## 2017-03-21 NOTE — Consult Note (Signed)
Cardiology Consultation   Patient ID: ARYAV WIMBERLY; [id number]; 23-Nov-1941   Admit date: 03/20/2017 Date of Consult: 03/21/2017  Referring Provider:  Hal Hope  Primary Care Provider: Claretta Fraise, MD Cardiologist: [place] Electrophysiologist:    Reason for Consultation: pericardial effusion  History of Present Illness: Melvin Wang is a 76 y.o. male who is being seen today for the evaluation of pericardial effusion noted on CT abd/pelvis done in ED at the request of hospitalist svc. Melvin Wang is well-known to our service w/ h/o end-stage R-sided HF and cor pulmonale, permanent afib, and cirrhosis. He has been having issues w/ confusion and episodes of unresponsiveness for the past couple of weeks; he had another episode of that nature today which precipitated this visit to the ED. Pt tells me that today he was not feeling well; he was nauseous, had vomiting a time or two, and was having some stomach cramping and multiple bowel movements. Surrounding all of this, he became very dizzy, decreasingly responsive and confused. He has already been seen by neurology who is concerned about possible seizure activity. We are called to see pt actually due to incidental pericardial effusion noted on CT abd/pelvis done due to pt's abdominal pain. The effusion measured by radiology between 1.0-1.8cm and appears circumferential on the CT images. The patient is hypotensive but is chronically hypotensive, and his BP is actually basically at his baseline. When I interviewed him he was awake, alert, in a pleasant mood, with appropriate affect, and in NAD. He says he basically feels back at his baseline at this point. I checked a pulsus w/ manual BP cuff; it is somewhat difficult to his afib and hypotension; it appears to be around 2-45mmHg.  Past Medical History:  Diagnosis Date  . Abnormality of gait   . Cataract   . Cirrhosis of liver with ascites ([place]) 01/19/2017  . CKD (chronic kidney  disease), stage III    [facility]  . Cor pulmonale (HCC)    Possible  . Essential hypertension   . Essential tremor   . Foot ulcer, right ([place]) 03/2013  . Gout   . Hyperlipidemia   . Permanent atrial fibrillation (HCC)    a. >20 years, prior DCCVs unsuccessful, INR followed by PCP.  Marland Kitchen Type 2 diabetes mellitus ([place])     Past Surgical History:  Procedure Laterality Date  . CATARACT EXTRACTION W/PHACO Left 04/19/2013   Procedure: CATARACT EXTRACTION PHACO AND INTRAOCULAR LENS PLACEMENT (IOC);  Surgeon: Tonny Branch, MD;  Location: [facility];  Service: Ophthalmology;  Laterality: Left;  CDE 29.30  . CATARACT EXTRACTION W/PHACO Right 07/15/2013   Procedure: CATARACT EXTRACTION PHACO AND INTRAOCULAR LENS PLACEMENT (IOC);  Surgeon: Tonny Branch, MD;  Location: [facility];  Service: Ophthalmology;  Laterality: Right;  CDE:12.84  . ESOPHAGOGASTRODUODENOSCOPY N/A 01/21/2017   Dr. Oneida Alar: mild erosive gastritis and moderate erosive duodenitis (negative H.pylori). Multiple gastric polyps.   Marland Kitchen RIGHT/LEFT HEART CATH AND CORONARY ANGIOGRAPHY N/A 03/04/2017   Procedure: Right/Left Heart Cath and Coronary Angiography;  Surgeon: Jolaine Artist, MD;  Location: [facility];  Service: Cardiovascular;  Laterality: N/A;      Current Medications: . allopurinol  300 mg Oral Daily  . amiodarone  200 mg Oral BID  . calcitRIOL  0.5 mcg Oral Daily  . febuxostat  40 mg Oral Daily  . insulin aspart  0-9 Units Subcutaneous TID WC  . lacosamide (VIMPAT) IV  100 mg Intravenous Q12H  . Lactulose  30 mL Oral BID  .  pantoprazole  40 mg Oral BID AC    Infused Medications:   PRN Medications: acetaminophen **OR** acetaminophen   Allergies:    Allergies  Allergen Reactions  . Acyclovir And Related Hives  . Metformin And Related Diarrhea  . Amoxicillin Hives and Other (See Comments)    Has patient had a PCN reaction causing immediate rash, facial/tongue/throat swelling, SOB or lightheadedness with hypotension:  No Has patient had a PCN reaction causing severe rash involving mucus membranes or skin necrosis: No Has patient had a PCN reaction that required hospitalization No Has patient had a PCN reaction occurring within the last 10 years: No If all of the above answers are "NO", then may proceed with Cephalosporin use.  . Augmentin [Amoxicillin-Pot Clavulanate] Hives and Other (See Comments)    Has patient had a PCN reaction causing immediate rash, facial/tongue/throat swelling, SOB or lightheadedness with hypotension: No Has patient had a PCN reaction causing severe rash involving mucus membranes or skin necrosis: No Has patient had a PCN reaction that required hospitalization No Has patient had a PCN reaction occurring within the last 10 years: No If all of the above answers are "NO", then may proceed with Cephalosporin use.  [name] [Trandolapril] Other (See Comments)    Reaction:  Caused renal problems    Social History:   The patient  reports that he quit smoking about 32 years ago. His smoking use included Cigarettes. He has a 15.00 pack-year smoking history. He has never used smokeless tobacco. He reports that he does not drink alcohol or use drugs.    Family History:   The patient's family history includes Cancer in his paternal uncle; Colon cancer (age of onset: 19) in his father.   ROS:  Please see the history of present illness.  All other ROS reviewed and negative.     Vital Signs: Blood pressure 99/76, pulse 75, temperature 98.1 F (36.7 C), temperature source Oral, resp. rate (!) 21, height 6\' 1"  (1.854 m), weight 99.8 kg (220 lb), SpO2 97 %.   PHYSICAL EXAM: General:  Well nourished, well developed, in no acute distress HEENT: normal Lymph: no adenopathy Neck: mild JVD Endocrine:  No thryomegaly Vascular: No carotid bruits; FA pulses 2+ bilaterally without bruits  Cardiac:  normal S1, S2; irreg irreg; 2/6 sys murmur LLSB  Lungs:  clear to auscultation bilaterally, no  wheezing, rhonchi or rales  Abd: soft, nontender, no hepatomegaly  Ext: no edema Musculoskeletal:  No deformities, BUE and BLE strength normal and equal Skin: warm and dry  Neuro:  CNs 2-12 intact, no focal abnormalities noted Psych:  Normal affect   EKG:  afib HR 96  Labs: No results for input(s): CKTOTAL, CKMB, TROPONINI in the last 72 hours.  Recent Labs  03/20/17 2131  TROPIPOC 0.01    Lab Results  Component Value Date   WBC 8.1 03/21/2017   HGB 11.0 (L) 03/21/2017   HCT 32.1 (L) 03/21/2017   MCV 99.4 03/21/2017   PLT 174 03/21/2017    Recent Labs Lab 03/20/17 2114  NA 132*  K 4.1  CL 90*  CO2 26  BUN 54*  CREATININE 2.27*  CALCIUM 9.6  PROT 7.5  BILITOT 2.3*  ALKPHOS 224*  ALT 17  AST 27  GLUCOSE 85   Lab Results  Component Value Date   CHOL 115 02/18/2017   HDL 40 02/18/2017   LDLCALC 61 02/18/2017   TRIG 72 02/18/2017   No results found for: DDIMER  Radiology/Studies:  Ct Abdomen Pelvis Wo Contrast  Result Date: 03/21/2017 CLINICAL DATA:  Abdominal pain and history of cirrhosis EXAM: CT ABDOMEN AND PELVIS WITHOUT CONTRAST TECHNIQUE: Multidetector CT imaging of the abdomen and pelvis was performed following the standard protocol without IV contrast. COMPARISON:  Abdominal ultrasound 01/20/2017 CT abdomen pelvis 01/20/2017 FINDINGS: Lower chest: There is bilateral gynecomastia. There are bilateral small pleural effusions and associated atelectasis. There is marked cardiomegaly with a pericardial effusion measuring up to 1.8 cm in thickness, new compared to the prior study. Hepatobiliary: There is a nodular hepatic contour with relative hypertrophy of the caudate and left hepatic lobe. There is a small amount of perihepatic ascites. The gallbladder contents are hyperdense. No biliary dilatation. Pancreas: Normal pancreatic contours. No peripancreatic fluid collection or pancreatic ductal dilatation. Spleen: Normal. Adrenals/Urinary Tract: Normal adrenal  glands. No hydronephrosis or nephrolithiasis. No abnormal perinephric stranding. No ureteral obstruction. Stomach/Bowel: No abnormal bowel dilatation. No bowel wall thickening or adjacent fat stranding to indicate acute inflammation. No abdominal fluid collection. The appendix is not clearly seen. Vascular/Lymphatic: There is atherosclerotic calcification of the non aneurysmal abdominal aorta. No abdominal or pelvic adenopathy. Reproductive: Normal prostate and seminal vesicles. Musculoskeletal: No lytic or blastic osseous lesion. No bony spinal canal stenosis. Multilevel thoracolumbar facet arthrosis and osteophytosis. Other: Small amount of perisplenic ascites. IMPRESSION: 1. Findings of hepatic cirrhosis with small amount of perihepatic ascites. 2. Pericardial effusion measuring up to 18 mm, new from the study of 01/20/2017. 3. Bilateral small pleural effusions with associated atelectasis. 4. Aortic atherosclerosis and cardiomegaly. Electronically Signed   By: Ulyses Jarred M.D.   On: 03/21/2017 03:54   Dg Chest 2 View  Result Date: 03/20/2017 CLINICAL DATA:  Weakness.  Nausea and vomiting. EXAM: CHEST  2 VIEW COMPARISON:  Chest radiograph 03/05/2017 FINDINGS: Unchanged cardiomegaly. Unchanged left basilar atelectasis. No focal consolidation otherwise. No pneumothorax or pleural effusion. No pulmonary edema. IMPRESSION: Unchanged appearance of left basilar atelectasis. Electronically Signed   By: Ulyses Jarred M.D.   On: 03/20/2017 22:33   Ct Head Wo Contrast  Result Date: 03/21/2017 CLINICAL DATA:  Confusion EXAM: CT HEAD WITHOUT CONTRAST TECHNIQUE: Contiguous axial images were obtained from the base of the skull through the vertex without intravenous contrast. COMPARISON:  None. FINDINGS: Brain: No mass lesion, intraparenchymal hemorrhage or extra-axial collection. No evidence of acute cortical infarct. There is periventricular hypoattenuation compatible with chronic microvascular disease. There is  cerebral volume loss without lobar predilection. Vascular: No hyperdense vessel or unexpected calcification. Skull: Normal visualized skull base, calvarium and extracranial soft tissues. Sinuses/Orbits: No sinus fluid levels or advanced mucosal thickening. No mastoid effusion. Normal orbits. IMPRESSION: 1. No acute intracranial abnormality. 2. Chronic microvascular ischemia and cerebral volume loss. Electronically Signed   By: Ulyses Jarred M.D.   On: 03/21/2017 03:32   L/RHC 03/04/17 Coronary Angiography : Normal coronary arteries Right Hemodynamics Ao = 90/56 (70) LV = 91/20 RA = 21 RV = 32/17 PA = 38/22 (29) PCW = 20 Fick cardiac output/index = 5.7/2.5 PVR = 1.5 WU Ao sat = 96% PA sat = 63%, 63% SVC sat = 63% SVR = 716 End stage RV failure with PAPI = 0.76  Echo 01-20-17 - Left ventricle: The cavity size was normal. Wall thickness was   increased in a pattern of mild LVH. Systolic function was normal.   The estimated ejection fraction was in the range of 55% to 60%. - Regional wall motion abnormality: Hypokinesis of the basal-mid   anteroseptal myocardium. -  Aortic valve: Valve area (VTI): 2.32 cm^2. Valve area (Vmax):   2.32 cm^2. Valve area (Vmean): 2.16 cm^2. - Mitral valve: There was mild regurgitation. - Left atrium: The atrium was severely dilated. - Right ventricle: The cavity size was severely dilated. TAPSE: 16   mm . - Right atrium: The atrium was severely dilated. - Atrial septum: No defect or patent foramen ovale was identified. - Tricuspid valve: There was mild-moderate regurgitation. The   available color Doppler images of the TR may underestimate its   severity, especially in the setting of a flattened ventricular   septum in diastole and systolic hepatic flow reversal which would   suggest more severe TR. - Pulmonary arteries: Systolic pressure could not be accurately   estimated. Inadequate TR jet, available Doppler likely   underestimates. - Inferior vena  cava: The vessel was dilated. The respirophasic   diameter changes were blunted (< 50%), consistent with elevated   central venous pressure. - Pericardium, extracardiac: There is a large left pleural   effusion. - Technically difficult study. Echocontrast was used to enhance   visualization.  ASSESSMENT AND PLAN:  1. Pericardial effusion: will order repeat echo. Effusion measures b/w 1-2cm on CT, moderate in size. Pt hypotensive but BP is at his baseline w/ chronic RV dysfunction. He actually appears fairly well-compensated clinically given the extent of his disease. There are no s/sx tamponade clinically. Will alert Dr. Haroldine Laws that pt is here.  2. RV dysfunction: HF consult. Pt is probably at or near his baseline clinically from a cardiac standpoint  3. Permanent afib: rate control is good; cont current regimen  4. Dyslipidemia/DM2: cont home regimen  Signed, Rudean Curt, MD  03/21/2017 5:41 AM

## 2017-03-21 NOTE — ED Notes (Signed)
[place] paged about BP 89/63

## 2017-03-21 NOTE — ED Notes (Signed)
Report given to great, RN on 5W

## 2017-03-21 NOTE — ED Notes (Signed)
Breakfast tray ordered 

## 2017-03-21 NOTE — ED Provider Notes (Addendum)
[facility] DEPT Provider Note   CSN: [id number] Arrival date & time: 03/20/17  2107     History   Chief Complaint Chief Complaint  Patient presents with  . Altered Mental Status    HPI Melvin Wang is a 76 y.o. male.  Patient is a 76 year old male with a history of cirrhosis of the liver, chronic kidney disease stage III, right-sided heart failure with recent echo showing an EF of 55-60%, atrial fibrillation on Coumadin therapy, diabetes who was recently hospitalized and discharged approximately 7 days ago presenting today with recurrent nausea, vomiting, diarrhea and confusion. Vomiting and having diarrhea diffusely to where he was so weak he was having difficulty standing. His wife states he has had intermittent confusion over the last 2 weeks during hospitalization and after discharge home. He has not changed any of his medications other than lowering his Lantus. He has taken his medications today as well as his lactulose twice a day.  Wife denies any known fever. But states he seemed worse today.   The history is provided by the patient, the spouse and the EMS personnel.    Past Medical History:  Diagnosis Date  . Abnormality of gait   . Cataract   . Cirrhosis of liver with ascites ([place]) 01/19/2017  . CKD (chronic kidney disease), stage III    [facility]  . Cor pulmonale (HCC)    Possible  . Essential hypertension   . Essential tremor   . Foot ulcer, right ([place]) 03/2013  . Gout   . Hyperlipidemia   . Permanent atrial fibrillation (HCC)    a. >20 years, prior DCCVs unsuccessful, INR followed by PCP.  Marland Kitchen Type 2 diabetes mellitus [facility])     Patient Active Problem List   Diagnosis Date Noted  . AKI (acute kidney injury) ([place])   . PAH (pulmonary artery hypertension)   . Chronic atrial fibrillation ([place])   . RVF (right ventricular failure) ([place])   . Malnutrition of moderate degree 02/28/2017  . Hypoglycemia 02/25/2017  . Hyponatremia 02/25/2017  .  Hypothermia 02/25/2017  . Hyperammonemia ([place]) 02/25/2017  . Chronic renal insufficiency, stage 3 (moderate) 02/25/2017  . Type 2 diabetes mellitus with renal complication ([place]) 93/23/5573  . Cirrhosis of liver without ascites ([place]) 02/03/2017  . Right ventricular enlargement 01/21/2017  . CKD (chronic kidney disease), stage II 01/21/2017  . Acute respiratory failure ([place]) 01/19/2017  . Cirrhosis of liver with ascites ([place]) 01/19/2017  . Anemia 01/19/2017  . Thrombocytopenia ([place]) 01/19/2017  . Hyperlipemia 06/18/2016  . Petechial rash 08/21/2015  . Gout 01/29/2015  . Permanent atrial fibrillation ([place]) 03/23/2013  . Abnormality of gait 03/18/2013  . Tremor, essential 03/18/2013  . Essential hypertension 03/18/2013  . Controlled type 2 diabetes mellitus with diabetic nephropathy ([place]) 03/18/2013    Past Surgical History:  Procedure Laterality Date  . CATARACT EXTRACTION W/PHACO Left 04/19/2013   Procedure: CATARACT EXTRACTION PHACO AND INTRAOCULAR LENS PLACEMENT (IOC);  Surgeon: Tonny Branch, MD;  Location: [facility] ORS;  Service: Ophthalmology;  Laterality: Left;  CDE 29.30  . CATARACT EXTRACTION W/PHACO Right 07/15/2013   Procedure: CATARACT EXTRACTION PHACO AND INTRAOCULAR LENS PLACEMENT (IOC);  Surgeon: Tonny Branch, MD;  Location: [facility] ORS;  Service: Ophthalmology;  Laterality: Right;  CDE:12.84  . ESOPHAGOGASTRODUODENOSCOPY N/A 01/21/2017   Dr. Oneida Alar: mild erosive gastritis and moderate erosive duodenitis (negative H.pylori). Multiple gastric polyps.   Marland Kitchen RIGHT/LEFT HEART CATH AND CORONARY ANGIOGRAPHY N/A 03/04/2017   Procedure: Right/Left Heart Cath and Coronary Angiography;  Surgeon: Jolaine Artist, MD;  Location: [facility] LAB;  Service: Cardiovascular;  Laterality: N/A;       Home Medications    Prior to Admission medications   Medication Sig Start Date End Date Taking? Authorizing Provider  allopurinol (ZYLOPRIM) 300 MG tablet Take 300 mg by mouth daily.    Historical  Provider, MD  amiodarone (PACERONE) 200 MG tablet Take 1 tablet (200 mg total) by mouth 2 (two) times daily. 03/07/17   Theodis Blaze, MD  B-D UF III MINI PEN NEEDLES 31G X 5 MM MISC USE 1 TIME DAILY WITH LANTUS SOLOSTAR 03/13/17   Claretta Fraise, MD  calcitRIOL (ROCALTROL) 0.5 MCG capsule Take 0.5 mcg by mouth daily.    Historical Provider, MD  febuxostat (ULORIC) 40 MG tablet Take 1 tablet (40 mg total) by mouth daily. 02/21/17   Claretta Fraise, MD  Insulin Glargine (LANTUS SOLOSTAR) 100 UNIT/ML Solostar Pen Inject 10 Units into the skin every morning. Please call your provider if glucose levels persistently  < 70 03/07/17   Theodis Blaze, MD  Lactulose 20 GM/30ML SOLN Take 30 mLs (20 g total) by mouth 2 (two) times daily. 03/13/17   Shirley Friar, PA-C  ONE TOUCH ULTRA TEST test strip  03/10/17   Historical Provider, MD  pantoprazole (PROTONIX) 40 MG tablet TAKE 1 TABLET (40 MG TOTAL) BY MOUTH 2 (TWO) TIMES DAILY BEFORE A MEAL. 03/10/17   Claretta Fraise, MD  propranolol (INDERAL) 20 MG tablet Take 1 tablet (20 mg total) by mouth 2 (two) times daily. 03/13/17   Shirley Friar, PA-C  torsemide (DEMADEX) 20 MG tablet Take 1 tablet (20 mg total) by mouth 2 (two) times daily. 03/16/17   Shirley Friar, PA-C  warfarin (COUMADIN) 10 MG tablet Take 5 mg by mouth See admin instructions. Pt takes every evening on Sunday, Tuesday, Wednesday, Thursday, and Saturday.    Historical Provider, MD  warfarin (COUMADIN) 2.5 MG tablet Take 1 tablet Tuesday, Wednesday, Thursday, Saturday and Sunday. 03/19/17   Claretta Fraise, MD    Family History Family History  Problem Relation Age of Onset  . Colon cancer Father 55    Deceased age 100   . Cancer Paternal Uncle   . Liver disease Neg Hx     Social History Social History  Substance Use Topics  . Smoking status: Former Smoker    Packs/day: 1.00    Years: 15.00    Types: Cigarettes    Quit date: 07/07/1984  . Smokeless tobacco: Never Used     Comment:  Smoked 20 years  . Alcohol use No     Comment: HISTORY OF HEAVY ETOH USE in 1966-1968, otherwise none      Allergies   Acyclovir and related; Metformin and related; Amoxicillin; Augmentin [amoxicillin-pot clavulanate]; and Mavik [trandolapril]   Review of Systems Review of Systems  All other systems reviewed and are negative.    Physical Exam Updated Vital Signs BP 92/64   Pulse (!) 43   Temp 98.1 F (36.7 C) (Oral)   Resp 20   Ht 6\' 1"  (1.854 m)   Wt 220 lb (99.8 kg)   SpO2 99%   BMI 29.03 kg/m   Physical Exam  Constitutional: He appears well-developed and well-nourished. No distress.  HENT:  Head: Normocephalic and atraumatic.  Mouth/Throat: Mucous membranes are dry.  Eyes: Conjunctivae and EOM are normal. Pupils are equal, round, and reactive to light.  Neck: Normal range of motion. Neck supple.  Cardiovascular: Intact distal pulses.  An irregularly irregular rhythm present. Tachycardia present.   No murmur heard. Pulmonary/Chest: Effort normal and breath sounds normal. No respiratory distress. He has no wheezes. He has no rales.  Abdominal: Soft. Bowel sounds are normal. He exhibits no distension. There is hepatomegaly. There is no tenderness. There is no rebound and no guarding.  Musculoskeletal: Normal range of motion. He exhibits edema. He exhibits no tenderness.  2+ bilateral pitting ankle edema  Neurological: He is alert.  Waxing and waning mental status.  At times alert and coherent and other times seems confused.  Skin: Skin is warm and dry. No rash noted. No erythema.  Psychiatric: He has a normal mood and affect. His behavior is normal.  Nursing note and vitals reviewed.    ED Treatments / Results  Labs (all labs ordered are listed, but only abnormal results are displayed) Labs Reviewed  COMPREHENSIVE METABOLIC PANEL - Abnormal; Notable for the following:       Result Value   Sodium 132 (*)    Chloride 90 (*)    BUN 54 (*)    Creatinine, Ser  2.27 (*)    Albumin 2.9 (*)    Alkaline Phosphatase 224 (*)    Total Bilirubin 2.3 (*)    GFR calc non Af Amer 27 (*)    GFR calc Af Amer 31 (*)    Anion gap 16 (*)    All other components within normal limits  CBC - Abnormal; Notable for the following:    WBC 10.6 (*)    RBC 3.38 (*)    Hemoglobin 11.4 (*)    HCT 33.6 (*)    RDW 15.8 (*)    All other components within normal limits  CBG MONITORING, ED - Abnormal; Notable for the following:    Glucose-Capillary 116 (*)    All other components within normal limits  LIPASE, BLOOD  AMMONIA  URINALYSIS, ROUTINE W REFLEX MICROSCOPIC  CBG MONITORING, ED  I-STAT TROPOININ, ED  CBG MONITORING, ED  I-STAT CG4 LACTIC ACID, ED    EKG  EKG Interpretation  Date/Time:  Thursday March 20 2017 21:15:16 EDT Ventricular Rate:  96 PR Interval:    QRS Duration: 163 QT Interval:  438 QTC Calculation: 515 R Axis:   47 Text Interpretation:  Atrial fibrillation Ventricular premature complex Right bundle branch block No significant change since last tracing Confirmed by Maryan Rued  MD, Loree Fee ([id number]) on 03/20/2017 9:25:22 PM       Radiology Dg Chest 2 View  Result Date: 03/20/2017 CLINICAL DATA:  Weakness.  Nausea and vomiting. EXAM: CHEST  2 VIEW COMPARISON:  Chest radiograph 03/05/2017 FINDINGS: Unchanged cardiomegaly. Unchanged left basilar atelectasis. No focal consolidation otherwise. No pneumothorax or pleural effusion. No pulmonary edema. IMPRESSION: Unchanged appearance of left basilar atelectasis. Electronically Signed   By: Ulyses Jarred M.D.   On: 03/20/2017 22:33    Procedures Procedures (including critical care time)  Medications Ordered in ED Medications  sodium chloride 0.9 % bolus 500 mL (500 mLs Intravenous New Bag/Given 03/20/17 2349)     Initial Impression / Assessment and Plan / ED Course  I have reviewed the triage vital signs and the nursing notes.  Pertinent labs & imaging results that were available during my  care of the patient were reviewed by me and considered in my medical decision making (see chart for details).    Patient is a 76 year old male with multiple medical problems was recently hospitalized and returns today with  nausea, vomiting, diarrhea, worsening confusion.  Patient has regularly but no localized abdominal pain concerning for PE. Is afebrile here. Patient is in atrial fibrillation but rate controlled at this time. Blood pressures have been soft but seems similar to when he left the hospital. Patient has waxing and waning mental status here and ongoing abdominal discomfort. He has also found to have worsening renal insufficiency. When leaving the hospital he was 1.7 he has now 2.2. Also may be related to dehydration due to nausea vomiting and diarrhea today. Lactic acid within normal limits, blood sugar stable, LFTs within normal limits but mild elevated total bilirubin, UA pending. Mild leukocytosis of 10,000 but negative lipase and pneumonia within normal limits. Chest x-ray without acute findings. However given patient's fluctuating mental status concerning for delirium, worsening recurrent nausea vomiting and diarrhea today. Will gently hydrate he did have an ejection fraction of 55-60% last week and given anti-medics. Will admit for observation.  Final Clinical Impressions(s) / ED Diagnoses   Final diagnoses:  Confusion  Dehydration  Acute kidney injury [facility])    New Prescriptions New Prescriptions   No medications on file     Blanchie Dessert, MD 03/21/17 0015    Blanchie Dessert, MD 03/21/17 0040

## 2017-03-21 NOTE — ED Notes (Addendum)
Upon entering pt room to transport to CT pt unresponsive, eyes twitching, pt not following commands. CBG checked and noted to be 102. [facility] paged and en route to see pt.

## 2017-03-21 NOTE — Progress Notes (Signed)
Bedside EEG completed, results pending. 

## 2017-03-21 NOTE — ED Notes (Signed)
Cardiology consulted per Dr. Hal Hope. Cards has seen pt and stated NOT concerned for tamponade. Pt has chronically low BP. See consult note.

## 2017-03-21 NOTE — Progress Notes (Signed)
  Echocardiogram 2D Echocardiogram has been performed.  Melvin Wang 03/21/2017, 1:51 PM

## 2017-03-21 NOTE — ED Notes (Signed)
Pt returned from CT with this RN, pt is now A/OX4

## 2017-03-21 NOTE — ED Notes (Signed)
Checked on pt in MRI-- pt remains awake but sleepy-- stable at present

## 2017-03-21 NOTE — ED Notes (Signed)
Family at bedside, wife will stay with patient during hospitalization - pt states "we have been married for 55 years, we don't want to spend time apart"

## 2017-03-22 ENCOUNTER — Inpatient Hospital Stay (HOSPITAL_COMMUNITY): Payer: Medicare Other

## 2017-03-22 DIAGNOSIS — I10 Essential (primary) hypertension: Secondary | ICD-10-CM

## 2017-03-22 DIAGNOSIS — E1122 Type 2 diabetes mellitus with diabetic chronic kidney disease: Secondary | ICD-10-CM

## 2017-03-22 DIAGNOSIS — I6330 Cerebral infarction due to thrombosis of unspecified cerebral artery: Secondary | ICD-10-CM | POA: Insufficient documentation

## 2017-03-22 LAB — CBC WITH DIFFERENTIAL/PLATELET
BASOS ABS: 0 10*3/uL (ref 0.0–0.1)
Basophils Relative: 0 %
EOS PCT: 2 %
Eosinophils Absolute: 0.1 10*3/uL (ref 0.0–0.7)
HEMATOCRIT: 33.2 % — AB (ref 39.0–52.0)
Hemoglobin: 11.2 g/dL — ABNORMAL LOW (ref 13.0–17.0)
LYMPHS ABS: 1.4 10*3/uL (ref 0.7–4.0)
LYMPHS PCT: 17 %
MCH: 33.7 pg (ref 26.0–34.0)
MCHC: 33.7 g/dL (ref 30.0–36.0)
MCV: 100 fL (ref 78.0–100.0)
MONO ABS: 1 10*3/uL (ref 0.1–1.0)
Monocytes Relative: 12 %
NEUTROS ABS: 5.6 10*3/uL (ref 1.7–7.7)
Neutrophils Relative %: 69 %
Platelets: 181 10*3/uL (ref 150–400)
RBC: 3.32 MIL/uL — ABNORMAL LOW (ref 4.22–5.81)
RDW: 16.3 % — AB (ref 11.5–15.5)
WBC: 8.1 10*3/uL (ref 4.0–10.5)

## 2017-03-22 LAB — AMMONIA: AMMONIA: 30 umol/L (ref 9–35)

## 2017-03-22 LAB — GLUCOSE, CAPILLARY
GLUCOSE-CAPILLARY: 92 mg/dL (ref 65–99)
Glucose-Capillary: 113 mg/dL — ABNORMAL HIGH (ref 65–99)
Glucose-Capillary: 73 mg/dL (ref 65–99)
Glucose-Capillary: 104 mg/dL — ABNORMAL HIGH (ref 65–99)
Glucose-Capillary: 137 mg/dL — ABNORMAL HIGH (ref 65–99)

## 2017-03-22 LAB — BASIC METABOLIC PANEL
Anion gap: 11 (ref 5–15)
BUN: 38 mg/dL — AB (ref 6–20)
CO2: 29 mmol/L (ref 22–32)
Calcium: 9.1 mg/dL (ref 8.9–10.3)
Chloride: 96 mmol/L — ABNORMAL LOW (ref 101–111)
Creatinine, Ser: 1.78 mg/dL — ABNORMAL HIGH (ref 0.61–1.24)
GFR calc Af Amer: 42 mL/min — ABNORMAL LOW (ref >60)
GFR, EST NON AFRICAN AMERICAN: 36 mL/min — AB (ref >60)
GLUCOSE: 96 mg/dL (ref 65–99)
POTASSIUM: 3.7 mmol/L (ref 3.5–5.1)
Sodium: 136 mmol/L (ref 135–145)

## 2017-03-22 LAB — PROTIME-INR
INR: 3.7
Prothrombin Time: 37.6 seconds — ABNORMAL HIGH (ref 11.4–15.2)

## 2017-03-22 MED ORDER — LACTULOSE 10 GM/15ML PO SOLN
30.0000 g | Freq: Three times a day (TID) | ORAL | Status: DC
  Administered 2017-03-24 (×3): 30 g via ORAL
  Filled 2017-03-22 (×7): qty 45

## 2017-03-22 MED ORDER — DEXTROSE-NACL 5-0.9 % IV SOLN
INTRAVENOUS | Status: DC
  Administered 2017-03-22 – 2017-03-23 (×2): via INTRAVENOUS

## 2017-03-22 MED ORDER — HALOPERIDOL 1 MG PO TABS
1.0000 mg | ORAL_TABLET | ORAL | Status: DC | PRN
  Filled 2017-03-22: qty 1

## 2017-03-22 MED ORDER — DEXTROSE IN LACTATED RINGERS 5 % IV SOLN: INTRAVENOUS | Status: DC

## 2017-03-22 MED ORDER — WARFARIN - PHARMACIST DOSING INPATIENT: Freq: Every day | Status: DC

## 2017-03-22 MED ORDER — LACOSAMIDE 50 MG PO TABS
100.0000 mg | ORAL_TABLET | Freq: Two times a day (BID) | ORAL | Status: DC
  Administered 2017-03-22: 100 mg via ORAL
  Filled 2017-03-22 (×2): qty 2

## 2017-03-22 MED ORDER — HALOPERIDOL LACTATE 5 MG/ML IJ SOLN
1.0000 mg | Freq: Once | INTRAMUSCULAR | Status: AC
  Administered 2017-03-22: 1 mg via INTRAMUSCULAR
  Filled 2017-03-22: qty 1

## 2017-03-22 MED ORDER — OLANZAPINE 5 MG PO TABS
5.0000 mg | ORAL_TABLET | Freq: Every day | ORAL | Status: DC
  Filled 2017-03-22: qty 1

## 2017-03-22 MED ORDER — HALOPERIDOL LACTATE 5 MG/ML IJ SOLN
1.0000 mg | INTRAMUSCULAR | Status: DC | PRN
  Administered 2017-03-23: 1 mg via INTRAMUSCULAR
  Filled 2017-03-22: qty 1

## 2017-03-22 MED ORDER — LACTULOSE 10 GM/15ML PO SOLN
30.0000 g | Freq: Four times a day (QID) | ORAL | Status: DC
  Administered 2017-03-22: 30 g via ORAL
  Filled 2017-03-22: qty 45

## 2017-03-22 NOTE — Progress Notes (Signed)
Patient at baseline from cardiac standpoint, please call with questions over the weekend.   Carlyle Dolly MD

## 2017-03-22 NOTE — Progress Notes (Addendum)
ANTICOAGULATION CONSULT NOTE -Follow up Pharmacy Consult for Warfarin  Indication: atrial fibrillation   Patient Measurements: Height: 6\' 1"  (185.4 cm) Weight: 227 lb 8.2 oz (103.2 kg) IBW/kg (Calculated) : 79.9  Vital Signs: Temp: 98.1 F (36.7 C) (04/14 0526) Temp Source: Oral (04/14 0526) BP: 93/52 (04/14 0526) Pulse Rate: 124 (04/14 0526)  Labs:  Recent Labs  03/20/17 1203  03/20/17 2114 03/21/17 0444 03/21/17 0452 03/21/17 1036 03/21/17 1647 03/22/17 0511  HGB  --   < > 11.4* 11.0*  --   --   --  11.2*  HCT  --   --  33.6* 32.1*  --   --   --  33.2*  PLT  --   --  245 174  --   --   --  181  LABPROT  --   --   --   --  37.8*  --   --  37.6*  INR 4.3*  --   --   --  3.72  --   --  3.70  CREATININE  --   --  2.27* 2.05*  --   --   --  1.78*  TROPONINI  --   --   --  0.03*  --  <0.03 0.03*  --   < > = values in this interval not displayed.  Estimated Creatinine Clearance: 46.6 mL/min (A) (by C-G formula based on SCr of 1.78 mg/dL (H)).   Medical History: Past Medical History:  Diagnosis Date  . Abnormality of gait   . Cataract   . Cirrhosis of liver with ascites ([place]) 01/19/2017  . CKD (chronic kidney disease), stage III    [facility]  . Cor pulmonale (HCC)    Possible  . Essential hypertension   . Essential tremor   . Foot ulcer, right ([place]) 03/2013  . Gout   . Hyperlipidemia   . Permanent atrial fibrillation (HCC)    a. >20 years, prior DCCVs unsuccessful, INR followed by PCP.  Marland Kitchen Type 2 diabetes mellitus [facility])     Assessment: 76 y/o M on warfarin PTA for history of afib, presented to [facility] on 03/20/17  with altered mental status. ON warfarin PTA for afib. INR 4.3 at outpatient anti-coag visit on 03/20/17. No coumadin given since admission (dose held 4/12 and 4/13).  INR today = 3.70, slow trend down but remains supra-therapeutic.  Hgb low/stable and pltc wnl.   (PTA 2.5mg  daily, last taken pta on 4/11)  Goal of Therapy:  INR 2-3 Monitor  platelets by anticoagulation protocol: Yes   Plan:  Hold coumadin today. Daily INR  Nicole Cella, RPh Clinical Pharmacist Pager: [phone] [place] [phone]  03/22/2017,11:37 AM

## 2017-03-22 NOTE — Progress Notes (Signed)
STROKE TEAM PROGRESS NOTE   HISTORY OF PRESENT ILLNESS (per record) Melvin Wang is a 76 y.o. male was admitted with an episode of unresponsiveness 2 weeks ago which was felt to be due to blood glucose of 23 at the time. While he was here, he developed atrial fibrillation with rates up to the 160s, started on amiodarone. He had a heart cath with normal coronaries. He was discharged after arriving home, has had recurrent episodes of confusion and unresponsiveness. He attributes these to simply feeling "tired" and thinks that a lot of fuss is being made about nothing.  His wife describes that today, he had an episode where he got off the toilet and walked out half dressed, he then held onto the wall and remain there for what seemed like a half hour and due to him not coming out of it, 911 was called.  While being admitted, he had another one of his episodes of unresponsiveness followed by some confusion and returned normal. For this reason I was consulted, and while is examining him, he was initially oriented and appropriate but then had an episode consisting of fluttering eyes, eyes are midline, head was slightly turned to the left, no abnormal movements of the extremities, speech and behavioral arrest. Following this, he was confused but able to follow commands. During the spell, I had the nurse take a blood pressure which read 237 systolic   SUBJECTIVE (INTERVAL HISTORY) His wife, two daughters and son in law are at the bedside.  They expressed concerns about pt ongoing behavior changes including confusion, illusion, delusion, fixation on certain topics. Pt is orientated but impaired on concentration and attention. Fixed on his weight measurement, eating, etc during conversation. Would like talk to me privately, not in front of his families.    OBJECTIVE Temp:  [97.6 F (36.4 C)-98.1 F (36.7 C)] 98.1 F (36.7 C) (04/14 0526) Pulse Rate:  [52-124] 124 (04/14 0526) Cardiac Rhythm: Atrial  fibrillation (04/14 0700) Resp:  [18-19] 18 (04/14 0526) BP: (82-133)/(52-113) 93/52 (04/14 0526) SpO2:  [91 %-97 %] 93 % (04/14 0526) Weight:  [100.6 kg (221 lb 12.5 oz)-103.2 kg (227 lb 8.2 oz)] 103.2 kg (227 lb 8.2 oz) (04/14 0204)  CBC:   Recent Labs Lab 03/21/17 0444 03/22/17 0511  WBC 8.1 8.1  NEUTROABS 5.7 5.6  HGB 11.0* 11.2*  HCT 32.1* 33.2*  MCV 99.4 100.0  PLT 174 628    Basic Metabolic Panel:   Recent Labs Lab 03/21/17 0444 03/22/17 0511  NA 132* 136  K 3.7 3.7  CL 97* 96*  CO2 25 29  GLUCOSE 77 96  BUN 51* 38*  CREATININE 2.05* 1.78*  CALCIUM 8.6* 9.1    Lipid Panel:     Component Value Date/Time   CHOL 115 02/18/2017 1524   TRIG 72 02/18/2017 1524   TRIG 190 (H) 01/27/2015 1055   HDL 40 02/18/2017 1524   HDL 38 (L) 01/27/2015 1055   CHOLHDL 2.9 02/18/2017 1524   LDLCALC 61 02/18/2017 1524   HgbA1c:  Lab Results  Component Value Date   HGBA1C 5.4 01/19/2017   Urine Drug Screen: No results found for: LABOPIA, COCAINSCRNUR, LABBENZ, AMPHETMU, THCU, LABBARB  Alcohol Level No results found for: Omar I have personally reviewed the radiological images below and agree with the radiology interpretations.  Ct Abdomen Pelvis Wo Contrast 03/21/2017 1. Findings of hepatic cirrhosis with small amount of perihepatic ascites.  2. Pericardial effusion measuring up to 18 mm, new  from the study of 01/20/2017.  3. Bilateral small pleural effusions with associated atelectasis.  4. Aortic atherosclerosis and cardiomegaly.   Dg Chest 2 View 03/20/2017 Unchanged appearance of left basilar atelectasis.   Ct Head Wo Contrast 03/21/2017 1. No acute intracranial abnormality.  2. Chronic microvascular ischemia and cerebral volume loss.   Mr Brain Wo Contrast 03/21/2017 1. Question punctate subacute white matter infarct in the posterior left pons.  2. No other acute or focal abnormality to explain the patient's symptoms. No acute or focal cortical  infarcts.  3. Mild anterior sinus disease.   Mr Jodene Nam Head Wo Contrast 03/22/2017 IMPRESSION: 1.  Negative intracranial MRA. 2. 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 No associated distal right ICA stenosis.   CUS pending  EEG - pending report    PHYSICAL EXAM  Temp:  [97.6 F (36.4 C)-99.7 F (37.6 C)] 99.7 F (37.6 C) (04/14 1415) Pulse Rate:  [113-124] 114 (04/14 1415) Resp:  [18] 18 (04/14 1408) BP: (93-161)/(52-85) 161/85 (04/14 1415) SpO2:  [91 %-100 %] 100 % (04/14 1415) Weight:  [227 lb 8.2 oz (103.2 kg)] 227 lb 8.2 oz (103.2 kg) (04/14 0204)  General - Well nourished, well developed, in no apparent distress.  Ophthalmologic - Fundi not visualized due to noncooperation.  Cardiovascular - irregularly irregular heart rate and rhythm.  Mental Status -  Level of arousal and orientation to time, place, and person were intact. Language including expression, naming, repetition, comprehension was assessed and found intact. Attention span and concentration were impaired, not able to calculate or backward spelling. Recent and remote memory 3/3 registration but 0/3 delayed recall. Fund of Knowledge was assessed and was impaired, able to tell past president 2/3.  Cranial Nerves II - XII - II - Visual field intact OU. III, IV, VI - Extraocular movements intact. V - Facial sensation intact bilaterally. VII - Facial movement intact bilaterally. VIII - Hearing & vestibular intact bilaterally. X - Palate elevates symmetrically. XI - Chin turning & shoulder shrug intact bilaterally. XII - Tongue protrusion intact.  Motor Strength - The patient's strength was normal in all extremities and pronator drift was absent.  Bulk was normal and fasciculations were absent.   Motor Tone - Muscle tone was assessed at the neck and appendages and was normal.  Reflexes - The patient's reflexes were 1+ in all extremities and he had no  pathological reflexes.  Sensory - Light touch, temperature/pinprick were assessed and were symmetrical.    Coordination - The patient had normal movements in the hands with no ataxia or dysmetria.  b/l arm resting tremor and postural tremor noted, but no asterixis.  Gait and Station - deferred   ASSESSMENT/PLAN Mr. Melvin Wang is a 76 y.o. male with history of unresponsive episodes, diabetes mellitus, liver cirrhosis, atrial fibrillation on warfarin (INR - 3.72) , hyperlipidemia, congestive heart failure, hypertension, chronic kidney disease, gout, and essential tremor presenting with episodic confusion, unresponsiveness, and behavior changes. He did not receive IV t-PA due to no convincing evidence for acute stroke.  Metabolic encephalopathy / delirium / behavior changes  Intermittent confusion, thought fixation, behavior changes since last admission for cirrhosis with high ammonia  Episodes more frequent and lasting longer as per family  Most consistent with metabolic encephalopathy due to hepatic encephalopathy, hyperammonemia, right heart failure with moderate pericardial effusion, hypotension  Pt also likely to have baseline cognitive impairment  Treat underlying causes   EEG  pending report  recommend psychiatry consultation for behavior changes including delusion, illusion, delirium, agitation, irritation  Cirrhosis   Hx of ascites s/p drainage  Hx of hyperammonemia on lactoluse  Last ammonia 23  Currently off lactoluse  Pericardia effusion, moderate  Found on CT and confirmed with TTE  hypotension   Cardiology on board  ? Seizure   Concern of confusion episode could be seizure like activities  On vimpat, recommend to continue  EEG pending report  Questionable posterior tiny left pons DWI changes - ? Tiny infarct, small vessel vs. Embolic due to afib. However, pt INR 3.70. However, this is incidental finding, can not explain pt symptoms.   MRI -  Question punctate subacute white matter infarct in the posterior left pons.   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 Yes; Fluid consistency: Thin; Fluid restriction: 1200 mL Fluid  warfarin daily prior to admission, now warfarin on hold due to high INR  Ongoing aggressive stroke risk factor management  Therapy recommendations: pending  Disposition: Pending  afib on coumadin   Coumadin on hold due to high INR and cirrhosis coagulopathy  INR 3.70  Hypotension  BP 90s, pt has baseline low BP  Hyperlipidemia  Home meds: No lipid lowering medications prior to admission   LDL 61, goal < 70  Diabetes  HgbA1c 5.4, goal < 7.0  Controlled  Other Stroke Risk Factors  Advanced age  The patient quit smoking 32 years ago.  Obesity, Body mass index is 30.02 kg/m., recommend weight loss, diet and exercise as appropriate   Other Active Problems  Mild anemia - 11.2 / 33.2  Chronic kidney disease - 38 / 1.78  Hospital day # 1  Rosalin Hawking, MD PhD Stroke Neurology 03/22/2017 5:33 PM  To contact Stroke Continuity provider, please refer to [email or web address] After hours, contact General Neurology

## 2017-03-22 NOTE — Progress Notes (Signed)
PROGRESS NOTE    Melvin Wang  XKG:[id number] DOB: 07-Oct-1941 DOA: 03/20/2017 PCP: Claretta Fraise, MD    Brief Narrative:  76 yo male with heart failure and cirrhosis, presents with episodes of confusion. In the ED developed 3 episodes of altered mentation, eyes fluttering, not verbal but follow commands. Lasting 3 to 4 minutes. On examination hemodynamically stable. Neurology consulted and patient started on Vimpat. MRI with small punctuate subacute white matter infarct in the posterior left pons, questionable findings. Patient has developed worsening encephalopathy.    Assessment & Plan:   Principal Problem:   Seizure (Early) Active Problems:   Essential hypertension   Permanent atrial fibrillation (HCC)   Anemia   Cirrhosis of liver without ascites (HCC)   Type 2 diabetes mellitus with renal complication (HCC)   RVF (right ventricular failure) (HCC)   PAH (pulmonary artery hypertension) (HCC)   Weakness   Acute encephalopathy   1. New Metabolic encephalopathy with delirium. Patient with worsening mentation, noted to have asterixis on physical examination, will check ammonia level and will increase lactulose therapy. Neuro checks q 4 hours and aspiration precautions. Haldol as needed.   1. New onset seizures. No clinical signs of further seizure activity, will continue on Vimpat. Nuero checks q 4 hours.  2. Subacute CVA, punctate subacute white matter infarct in the posterior left pons. Case discussed with neurology, continue work up with carotid US and MRA.    2. Systolic heart failure chronic and stable. Continue diuresis with Torsemide 20 mg day. Pericardial effusion with no signs of tamponade follow on echocardiogram. Episodes of hypotension, will continue close monitoring, last blood pressure up to 702 systolic (63:78)  3. Paroxysmal atrial fibrillation. Continue rate control, continue amiodarone. Anticoagulation with warfarin, INR at  3.70. Follow pharmacy protocol.    4. 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 Patient with worsening mentation, positive asterixis and features of delirium. Increase dose of lactulose, avoid benzodiazepines.   5. HTN. Episodic hypotension, will continue to hold antihypertensive agents,    6. CKD stage 3. Renal functio stable, with cr at 1,78 from 2,05 with K at 3,7 and serum bicarbonate at 29, will continue torsemide for diuresis.   7. T2DM. Continue glucose cover and monitoring with sliding scale. Capillary glucose 113, 73, 92.    DVT prophylaxis: on warfarin at home  Code Status: full  Family Communication: I spoke with patient's family at the bedside and all questions were addressed.  Disposition Plan: home    Consultants:   Neurology  Cardiology   Procedures:    Antimicrobials:     Subjective: Patient has developed worsening confusion associated with upper extremity tremors, no nausea or vomiting, no chest pain or dyspnea.   Objective: Vitals:   03/21/17 1646 03/21/17 2140 03/22/17 0204 03/22/17 0526  BP: (!) 92/56 (!) 103/55  (!) 93/52  Pulse: 80 (!) 113  (!) 124  Resp: 19 18  18   Temp:  97.9 F (36.6 C)  98.1 F (36.7 C)  TempSrc:  Oral  Oral  SpO2: 92% 91%  93%  Weight:   103.2 kg (227 lb 8.2 oz)   Height:        Intake/Output Summary (Last 24 hours) at 03/22/17 1139 Last data filed at 03/22/17 0612  Gross per 24 hour  Intake              135 ml  Output             2000 ml  Net            -  1865 ml   Filed Weights   03/20/17 2112 03/21/17 1618 03/22/17 0204  Weight: 99.8 kg (220 lb) 100.6 kg (221 lb 12.5 oz) 103.2 kg (227 lb 8.2 oz)    Examination:  General exam: deconditioned and ill looking appearing E ENT: positive pallor, no icterus, oral mucosa dry. Respiratory system: Decreased breath sounds at bases. No wheezing, rales or rhonchi.  Cardiovascular system: S1 & S2 heard, RRR. No JVD. Positive murmurs at the apex, systolic 3/6 with no radiation,  rubs, gallops or clicks. No pedal edema. Gastrointestinal system: Abdomen is nondistended, soft and nontender. No organomegaly or masses felt. Normal bowel sounds heard. Central nervous system: confusion, no agitation, non focal, positive asterixis.  Extremities: Symmetric 5 x 5 power. Skin: No rashes, lesions or ulcers     Data Reviewed: I have personally reviewed following labs and imaging studies  CBC:  Recent Labs Lab 03/18/17 0849 03/20/17 2114 03/21/17 0444 03/22/17 0511  WBC 8.3 10.6* 8.1 8.1  NEUTROABS 5.7  --  5.7 5.6  HGB  --  11.4* 11.0* 11.2*  HCT 34.4* 33.6* 32.1* 33.2*  MCV 100* 99.4 99.4 100.0  PLT 252 245 174 889   Basic Metabolic Panel:  Recent Labs Lab 03/18/17 0849 03/20/17 2114 03/21/17 0444 03/22/17 0511  NA 133* 132* 132* 136  K 4.3 4.1 3.7 3.7  CL 89* 90* 97* 96*  CO2 29 26 25 29   GLUCOSE 88 85 77 96  BUN 59* 54* 51* 38*  CREATININE 2.05* 2.27* 2.05* 1.78*  CALCIUM 10.0 9.6 8.6* 9.1   GFR: Estimated Creatinine Clearance: 46.6 mL/min (A) (by C-G formula based on SCr of 1.78 mg/dL (H)). Liver Function Tests:  Recent Labs Lab 03/18/17 0849 03/20/17 2114 03/21/17 0444  AST 21 27 22   ALT 16 17 16*  ALKPHOS 267* 224* 205*  BILITOT 1.6* 2.3* 2.1*  PROT 7.3 7.5 6.7  ALBUMIN 3.1* 2.9* 2.5*    Recent Labs Lab 03/20/17 2114  LIPASE 41    Recent Labs Lab 03/18/17 0849 03/20/17 2114  AMMONIA 36 23   Coagulation Profile:  Recent Labs Lab 03/18/17 0820 03/20/17 1203 03/21/17 0452 03/22/17 0511  INR 6.2* 4.3* 3.72 3.70   Cardiac Enzymes:  Recent Labs Lab 03/21/17 0444 03/21/17 1036 03/21/17 1647  TROPONINI 0.03* <0.03 0.03*   BNP (last 3 results) No results for input(s): PROBNP in the last 8760 hours. HbA1C: No results for input(s): HGBA1C in the last 72 hours. CBG:  Recent Labs Lab 03/21/17 1555 03/21/17 1703 03/21/17 2122 03/22/17 0249 03/22/17 0832  GLUCAP 93 89 87 113* 73   Lipid Profile: No results  for input(s): CHOL, HDL, LDLCALC, TRIG, CHOLHDL, LDLDIRECT in the last 72 hours. Thyroid Function Tests:  Recent Labs  03/21/17 0444  TSH 2.724   Anemia Panel: No results for input(s): VITAMINB12, FOLATE, FERRITIN, TIBC, IRON, RETICCTPCT in the last 72 hours. Sepsis Labs:  Recent Labs Lab 03/20/17 2354 03/21/17 0444 03/21/17 1036  LATICACIDVEN 1.45 1.1 1.4    No results found for this or any previous visit (from the past 240 hour(s)).       Radiology Studies: Ct Abdomen Pelvis Wo Contrast  Result Date: 03/21/2017 CLINICAL DATA:  Abdominal pain and history of cirrhosis EXAM: CT ABDOMEN AND PELVIS WITHOUT CONTRAST TECHNIQUE: Multidetector CT imaging of the abdomen and pelvis was performed following the standard protocol without IV contrast. COMPARISON:  Abdominal ultrasound 01/20/2017 CT abdomen pelvis 01/20/2017 FINDINGS: Lower chest: There is bilateral gynecomastia. There are bilateral small  pleural effusions and associated atelectasis. There is marked cardiomegaly with a pericardial effusion measuring up to 1.8 cm in thickness, new compared to the prior study. Hepatobiliary: There is a nodular hepatic contour with relative hypertrophy of the caudate and left hepatic lobe. There is a small amount of perihepatic ascites. The gallbladder contents are hyperdense. No biliary dilatation. Pancreas: Normal pancreatic contours. No peripancreatic fluid collection or pancreatic ductal dilatation. Spleen: Normal. Adrenals/Urinary Tract: Normal adrenal glands. No hydronephrosis or nephrolithiasis. No abnormal perinephric stranding. No ureteral obstruction. Stomach/Bowel: No abnormal bowel dilatation. No bowel wall thickening or adjacent fat stranding to indicate acute inflammation. No abdominal fluid collection. The appendix is not clearly seen. Vascular/Lymphatic: There is atherosclerotic calcification of the non aneurysmal abdominal aorta. No abdominal or pelvic adenopathy. Reproductive: Normal  prostate and seminal vesicles. Musculoskeletal: No lytic or blastic osseous lesion. No bony spinal canal stenosis. Multilevel thoracolumbar facet arthrosis and osteophytosis. Other: Small amount of perisplenic ascites. IMPRESSION: 1. Findings of hepatic cirrhosis with small amount of perihepatic ascites. 2. Pericardial effusion measuring up to 18 mm, new from the study of 01/20/2017. 3. Bilateral small pleural effusions with associated atelectasis. 4. Aortic atherosclerosis and cardiomegaly. Electronically Signed   By: Ulyses Jarred M.D.   On: 03/21/2017 03:54   Dg Chest 2 View  Result Date: 03/20/2017 CLINICAL DATA:  Weakness.  Nausea and vomiting. EXAM: CHEST  2 VIEW COMPARISON:  Chest radiograph 03/05/2017 FINDINGS: Unchanged cardiomegaly. Unchanged left basilar atelectasis. No focal consolidation otherwise. No pneumothorax or pleural effusion. No pulmonary edema. IMPRESSION: Unchanged appearance of left basilar atelectasis. Electronically Signed   By: Ulyses Jarred M.D.   On: 03/20/2017 22:33   Ct Head Wo Contrast  Result Date: 03/21/2017 CLINICAL DATA:  Confusion EXAM: CT HEAD WITHOUT CONTRAST TECHNIQUE: Contiguous axial images were obtained from the base of the skull through the vertex without intravenous contrast. COMPARISON:  None. FINDINGS: Brain: No mass lesion, intraparenchymal hemorrhage or extra-axial collection. No evidence of acute cortical infarct. There is periventricular hypoattenuation compatible with chronic microvascular disease. There is cerebral volume loss without lobar predilection. Vascular: No hyperdense vessel or unexpected calcification. Skull: Normal visualized skull base, calvarium and extracranial soft tissues. Sinuses/Orbits: No sinus fluid levels or advanced mucosal thickening. No mastoid effusion. Normal orbits. IMPRESSION: 1. No acute intracranial abnormality. 2. Chronic microvascular ischemia and cerebral volume loss. Electronically Signed   By: Ulyses Jarred M.D.   On:  03/21/2017 03:32   Mr Brain Wo Contrast  Result Date: 03/21/2017 CLINICAL DATA:  Spells of speech arrest.  Periods of confusion. EXAM: MRI HEAD WITHOUT CONTRAST TECHNIQUE: Multiplanar, multiecho pulse sequences of the brain and surrounding structures were obtained without intravenous contrast. COMPARISON:  CT head without contrast 03/21/2017. FINDINGS: Brain: A punctate area diffusion abnormality is present within the posterior left pons. The coronal diffusion images skin over this area. No significant cortical infarct is present. Mild atrophy is otherwise within normal limits for age. There is no significant white matter disease. The brainstem and cerebellum are otherwise within normal limits. T2 changes are associated with the area noted on the diffusion series. Internal auditory canals are within normal limits. Vascular: Flow is present in the major intracranial arteries. Skull and upper cervical spine: The skullbase is within normal limits. Midline sagittal structures R within normal limits. The upper cervical spine is unremarkable. The craniocervical junction is normal. Sinuses/Orbits: Mild mucosal thickening is present within the maxillary sinuses and anterior ethmoid air cells bilaterally, left greater than right. There is minimal mucosal  thickening within the frontal sinuses. The sphenoid sinuses are clear. Minimal fluid is present in the inferior mastoid air cells bilaterally. No obstructing nasopharyngeal lesion is present. IMPRESSION: 1. Question punctate subacute white matter infarct in the posterior left pons. 2. No other acute or focal abnormality to explain the patient's symptoms. No acute or focal cortical infarcts. 3. Mild anterior sinus disease. Electronically Signed   By: San Morelle M.D.   On: 03/21/2017 09:52        Scheduled Meds: . allopurinol  300 mg Oral Daily  . amiodarone  200 mg Oral BID  . calcitRIOL  0.5 mcg Oral Daily  . colchicine  0.6 mg Oral BID  .  febuxostat  40 mg Oral Daily  . insulin aspart  0-9 Units Subcutaneous TID WC  . lacosamide  100 mg Oral BID  . lactulose  30 g Oral Q6H  . pantoprazole  40 mg Oral BID AC  . torsemide  20 mg Oral BID   Continuous Infusions:   LOS: 1 day      Mauricio Gerome Apley, MD [facility] Pager [phone]  If 7PM-7AM, please contact night-coverage [email or web address] Password [facility] 03/22/2017, 11:39 AM

## 2017-03-23 ENCOUNTER — Inpatient Hospital Stay (HOSPITAL_COMMUNITY): Payer: Medicare Other

## 2017-03-23 DIAGNOSIS — R569 Unspecified convulsions: Secondary | ICD-10-CM

## 2017-03-23 LAB — BASIC METABOLIC PANEL
Anion gap: 9 (ref 5–15)
BUN: 33 mg/dL — ABNORMAL HIGH (ref 6–20)
CHLORIDE: 100 mmol/L — AB (ref 101–111)
CO2: 30 mmol/L (ref 22–32)
Calcium: 8.8 mg/dL — ABNORMAL LOW (ref 8.9–10.3)
Creatinine, Ser: 1.67 mg/dL — ABNORMAL HIGH (ref 0.61–1.24)
GFR, EST AFRICAN AMERICAN: 45 mL/min — AB (ref >60)
GFR, EST NON AFRICAN AMERICAN: 39 mL/min — AB (ref >60)
Glucose, Bld: 111 mg/dL — ABNORMAL HIGH (ref 65–99)
POTASSIUM: 3.3 mmol/L — AB (ref 3.5–5.1)
SODIUM: 139 mmol/L (ref 135–145)

## 2017-03-23 LAB — GLUCOSE, CAPILLARY
GLUCOSE-CAPILLARY: 88 mg/dL (ref 65–99)
GLUCOSE-CAPILLARY: 121 mg/dL — AB (ref 65–99)

## 2017-03-23 LAB — CBC WITH DIFFERENTIAL/PLATELET
BASOS ABS: 0 10*3/uL (ref 0.0–0.1)
BASOS PCT: 0 %
EOS ABS: 0 10*3/uL (ref 0.0–0.7)
EOS PCT: 0 %
HCT: 33.3 % — ABNORMAL LOW (ref 39.0–52.0)
HEMOGLOBIN: 11.3 g/dL — AB (ref 13.0–17.0)
Lymphocytes Relative: 11 %
Lymphs Abs: 0.9 10*3/uL (ref 0.7–4.0)
MCH: 34 pg (ref 26.0–34.0)
MCHC: 33.9 g/dL (ref 30.0–36.0)
MCV: 100.3 fL — ABNORMAL HIGH (ref 78.0–100.0)
Monocytes Absolute: 1.1 10*3/uL — ABNORMAL HIGH (ref 0.1–1.0)
Monocytes Relative: 13 %
NEUTROS PCT: 76 %
Neutro Abs: 6.6 10*3/uL (ref 1.7–7.7)
PLATELETS: 208 10*3/uL (ref 150–400)
RBC: 3.32 MIL/uL — AB (ref 4.22–5.81)
RDW: 16.2 % — ABNORMAL HIGH (ref 11.5–15.5)
WBC: 8.7 10*3/uL (ref 4.0–10.5)

## 2017-03-23 LAB — PROTIME-INR
INR: 3.18
PROTHROMBIN TIME: 33.3 s — AB (ref 11.4–15.2)

## 2017-03-23 LAB — AMMONIA: AMMONIA: 26 umol/L (ref 9–35)

## 2017-03-23 MED ORDER — LORAZEPAM 2 MG/ML IJ SOLN
0.5000 mg | Freq: Once | INTRAMUSCULAR | Status: AC
  Administered 2017-03-23: 0.5 mg via INTRAVENOUS
  Filled 2017-03-23: qty 1

## 2017-03-23 MED ORDER — POTASSIUM CHLORIDE CRYS ER 20 MEQ PO TBCR: 20.0000 meq | EXTENDED_RELEASE_TABLET | Freq: Two times a day (BID) | ORAL | Status: AC

## 2017-03-23 MED ORDER — SODIUM CHLORIDE 0.9 % IV SOLN
100.0000 mg | Freq: Two times a day (BID) | INTRAVENOUS | Status: DC
  Administered 2017-03-23 – 2017-03-26 (×6): 100 mg via INTRAVENOUS
  Filled 2017-03-23 (×11): qty 10

## 2017-03-23 MED ORDER — WARFARIN 0.5 MG HALF TABLET
0.5000 mg | ORAL_TABLET | Freq: Once | ORAL | Status: DC
  Filled 2017-03-23: qty 1

## 2017-03-23 MED ORDER — HALOPERIDOL 0.5 MG PO TABS: 0.5000 mg | ORAL_TABLET | ORAL | Status: DC | PRN

## 2017-03-23 MED ORDER — HALOPERIDOL LACTATE 5 MG/ML IJ SOLN: 0.5000 mg | INTRAMUSCULAR | Status: DC | PRN

## 2017-03-23 MED ORDER — OLANZAPINE 10 MG PO TABS
10.0000 mg | ORAL_TABLET | Freq: Every day | ORAL | Status: DC
Start: 2017-03-23 — End: 2017-03-24
  Filled 2017-03-23: qty 1

## 2017-03-23 NOTE — Progress Notes (Signed)
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 In the ED developed 3 episodes of altered mentation, eyes fluttering, not verbal but following commands. Lasting 3 to 4 minutes. On examination hemodynamically stable. Neurology consulted and patient started on Vimpat. MRI with small punctuate subacute white matter infarct in the posterior left pons. Patient has developed worsening encephalopathy with delirium, requiring antipsycotics.   Assessment & Plan:   Principal Problem:   Seizure ([place]) Active Problems:   Essential hypertension   Permanent atrial fibrillation (HCC)   Anemia   Cirrhosis of liver without ascites (HCC)   Type 2 diabetes mellitus with renal complication (HCC)   RVF (right ventricular failure) (HCC)   PAH (pulmonary artery hypertension) (HCC)   Weakness   Confusion   Cerebral thrombosis with cerebral infarction  1. New Metabolic encephalopathy with delirium. Patient with persistent encephalopathy, continue lactulose therapy as tolerated, will continue neuro checks. Will decrease dose of haldol, avoid benzodiazepines and will increase dose of olanzapine at 10 mg qhs. Resume IV fluid as tolerated.   2. New onset seizures. Continue on Vimpat, per neurology recommendations. Nuero checks q 4 hours with aspiration precautions.   3. Subacute CVA, punctate subacute white matter infarct in the posterior left pons. pending carotid US, will continue neuro checks, holding antiplatelet therapy for now. Case discussed with neurology, questionable CVA.   4. Systolic heart failure chronic and stable. Hold on diuresis. Positive for pericardial effusion with no signs of tamponade. Resume gentle hydration, clinically patient hypovolemic.   5.  Paroxysmal atrial fibrillation. Continue rate control, continue amiodarone. Anticoagulation with warfarin, INR at  3.70. Follow pharmacy protocol. Blood pressure 818 to 299 systolic,   6. 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 No significant elevation of ammonia, will continue lactulose therapy, neuro checks and medical supportive care. Aspiration precautions. No significant ascites per physical examination.   7. HTN. Will continue to hold on antihypertensive agents and further diuresis.   8. CKD stage 3.Renal functio stable, with cr at 1,67, K at 3,3 and serum bicarbonate at 30. Hold on diuresis and continue gentle hydration with dextrose and isotonic saline.   9. T2DM. Glucose cover and monitoring with sliding scale. Capillary glucose 104, 137, 88.    DVT prophylaxis:on warfarin at home  Code Status:full  Family Communication:I spoke with patient's family at the bedside and all questions were addressed.  Disposition Plan:home    Consultants:  Neurology  Cardiology   Procedures:   Antimicrobials  Subjective: Patient has remained confused and agitated, pulling IVs and cardiac monitoring, upset with the light. Disorganized thinking and lack of attention. Started on antipsychotic therapy. This am deeply sedated not following commands or opening eyes. Patient had dose of lorazepam this am.   Objective: Vitals:   03/22/17 2300 03/23/17 0041 03/23/17 0414 03/23/17 0556  BP:  (!) 149/66  (!) 141/83  Pulse:  (!) 139  (!) 127  Resp:  18  18  Temp: 98.1 F (36.7 C)   97.3 F (36.3 C)  TempSrc: Oral     SpO2:  95%  91%  Weight:   98 kg (216 lb 0.8 oz)   Height:        Intake/Output Summary (Last 24 hours) at 03/23/17 1025 Last data filed at 03/23/17  0600  Gross per 24 hour  Intake          1736.67 ml  Output              500 ml  Net          1236.67 ml   Filed Weights   03/21/17 1618 03/22/17 0204 03/23/17 0414  Weight: 100.6 kg (221 lb  12.5 oz) 103.2 kg (227 lb 8.2 oz) 98 kg (216 lb 0.8 oz)    Examination:  General exam: deconditioned and ill looking appearing E ENT: Pallor but no icterus, oral mucosa dry.  Respiratory system: Mild bibasilar rales, no wheezing or rhonchi. Decreased inspiratory effort. Cardiovascular system: S1 & S2 heard, RRR. No JVD, murmurs, rubs, gallops or clicks. No pedal edema. Gastrointestinal system: Abdomen is nondistended, soft and nontender. No organomegaly or masses felt. Normal bowel sounds heard. Central nervous system: Alert and oriented. No focal neurological deficits. Extremities: Symmetric 5 x 5 power. Skin: No rashes, lesions or ulcers     Data Reviewed: I have personally reviewed following labs and imaging studies  CBC:  Recent Labs Lab 03/18/17 0849 03/20/17 2114 03/21/17 0444 03/22/17 0511 03/23/17 0534  WBC 8.3 10.6* 8.1 8.1 8.7  NEUTROABS 5.7  --  5.7 5.6 6.6  HGB  --  11.4* 11.0* 11.2* 11.3*  HCT 34.4* 33.6* 32.1* 33.2* 33.3*  MCV 100* 99.4 99.4 100.0 100.3*  PLT 252 245 174 181 235   Basic Metabolic Panel:  Recent Labs Lab 03/18/17 0849 03/20/17 2114 03/21/17 0444 03/22/17 0511 03/23/17 0534  NA 133* 132* 132* 136 139  K 4.3 4.1 3.7 3.7 3.3*  CL 89* 90* 97* 96* 100*  CO2 29 26 25 29 30   GLUCOSE 88 85 77 96 111*  BUN 59* 54* 51* 38* 33*  CREATININE 2.05* 2.27* 2.05* 1.78* 1.67*  CALCIUM 10.0 9.6 8.6* 9.1 8.8*   GFR: Estimated Creatinine Clearance: 48.5 mL/min (A) (by C-G formula based on SCr of 1.67 mg/dL (H)). Liver Function Tests:  Recent Labs Lab 03/18/17 0849 03/20/17 2114 03/21/17 0444  AST 21 27 22   ALT 16 17 16*  ALKPHOS 267* 224* 205*  BILITOT 1.6* 2.3* 2.1*  PROT 7.3 7.5 6.7  ALBUMIN 3.1* 2.9* 2.5*    Recent Labs Lab 03/20/17 2114  LIPASE 41    Recent Labs Lab 03/18/17 0849 03/20/17 2114 03/22/17 1154 03/23/17 0534  AMMONIA 36 23 30 26    Coagulation Profile:  Recent Labs Lab 03/18/17 0820 03/20/17 1203  03/21/17 0452 03/22/17 0511 03/23/17 0534  INR 6.2* 4.3* 3.72 3.70 3.18   Cardiac Enzymes:  Recent Labs Lab 03/21/17 0444 03/21/17 1036 03/21/17 1647  TROPONINI 0.03* <0.03 0.03*   BNP (last 3 results) No results for input(s): PROBNP in the last 8760 hours. HbA1C: No results for input(s): HGBA1C in the last 72 hours. CBG:  Recent Labs Lab 03/22/17 0249 03/22/17 0832 03/22/17 1159 03/22/17 1701 03/22/17 2158  GLUCAP 113* 73 92 104* 137*   Lipid Profile: No results for input(s): CHOL, HDL, LDLCALC, TRIG, CHOLHDL, LDLDIRECT in the last 72 hours. Thyroid Function Tests:  Recent Labs  03/21/17 0444  TSH 2.724   Anemia Panel: No results for input(s): VITAMINB12, FOLATE, FERRITIN, TIBC, IRON, RETICCTPCT in the last 72 hours. Sepsis Labs:  Recent Labs Lab 03/20/17 2354 03/21/17 0444 03/21/17 1036  LATICACIDVEN 1.45 1.1 1.4    No results found for this or any previous visit (from the past 240 hour(s)).  Radiology Studies: Mr Virgel Paling NL Contrast  Result Date: 03/22/2017 CLINICAL DATA:  76 year old male with questionable left dorsal pons lacunar infarct on brain MRI yesterday for abnormal speech and confusion. EXAM: MRA HEAD WITHOUT CONTRAST TECHNIQUE: Angiographic images of the Circle of Willis were obtained using MRA technique without intravenous contrast. COMPARISON:  Brain MRI 03/21/2017. FINDINGS: No intracranial mass effect or ventriculomegaly. Antegrade flow in the posterior circulation. Mildly dominant distal left vertebral artery. Tortuous vertebrobasilar junction with no distal vertebral or basilar irregularity or stenosis. Left PICA and dominant appearing AICA origins are patent. Normal SCA and right PCA origins. Fetal type left PCA origin. Bilateral PCA branches are within normal limits. Right posterior communicating artery is diminutive or absent. Antegrade flow in both ICA siphons. 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 No distal cervical ICA stenosis identified. Tortuous cavernous segments. No ICA siphon stenosis. Ophthalmic and left posterior communicating artery origins are normal. Patent carotid termini. Normal MCA and ACA origins. Anterior communicating artery and visible ACA branches are within normal limits. Early left MCA bifurcation. Left MCA M1 segment and visible left MCA branches are within normal limits. Right MCA M1 segment, right MCA bifurcation, and visible right MCA branches are within normal limits. IMPRESSION: 1.  Negative intracranial MRA. 2. 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 No associated distal right ICA stenosis. Electronically Signed   By: Genevie Ann M.D.   On: 03/22/2017 14:29        Scheduled Meds: . allopurinol  300 mg Oral Daily  . amiodarone  200 mg Oral BID  . calcitRIOL  0.5 mcg Oral Daily  . colchicine  0.6 mg Oral BID  . febuxostat  40 mg Oral Daily  . insulin aspart  0-9 Units Subcutaneous TID WC  . lacosamide  100 mg Oral BID  . lactulose  30 g Oral TID  . OLANZapine  5 mg Oral QHS  . pantoprazole  40 mg Oral BID AC  . Warfarin - Pharmacist Dosing Inpatient   Does not apply q1800   Continuous Infusions: . dextrose 5 % and 0.9% NaCl 50 mL/hr at 03/22/17 1816     LOS: 2 days        Tawni Millers, MD [facility] Pager [phone]  If 7PM-7AM, please contact night-coverage [email or web address] Password [facility] 03/23/2017, 10:25 AM

## 2017-03-23 NOTE — Progress Notes (Signed)
VASCULAR LAB PRELIMINARY  PRELIMINARY  PRELIMINARY  PRELIMINARY  Carotid duplex completed.    Preliminary report:  1-39% ICA plaquing. Vertebral artery flow is antegrade.  Judene Logue, RVT 03/23/2017, 5:11 PM

## 2017-03-23 NOTE — Progress Notes (Signed)
ANTICOAGULATION CONSULT NOTE -Follow up Pharmacy Consult for Warfarin  Indication: atrial fibrillation   Patient Measurements: Height: 6\' 1"  (185.4 cm) Weight: 216 lb 0.8 oz (98 kg) IBW/kg (Calculated) : 79.9  Vital Signs: Temp: 97.3 F (36.3 C) (04/15 0556) BP: 141/83 (04/15 0556) Pulse Rate: 127 (04/15 0556)  Labs:  Recent Labs  03/21/17 0444 03/21/17 0452 03/21/17 1036 03/21/17 1647 03/22/17 0511 03/23/17 0534  HGB 11.0*  --   --   --  11.2* 11.3*  HCT 32.1*  --   --   --  33.2* 33.3*  PLT 174  --   --   --  181 208  LABPROT  --  37.8*  --   --  37.6* 33.3*  INR  --  3.72  --   --  3.70 3.18  CREATININE 2.05*  --   --   --  1.78* 1.67*  TROPONINI 0.03*  --  <0.03 0.03*  --   --     Estimated Creatinine Clearance: 48.5 mL/min (A) (by C-G formula based on SCr of 1.67 mg/dL (H)).   Medical History: Past Medical History:  Diagnosis Date  . Abnormality of gait   . Cataract   . Cirrhosis of liver with ascites ([place]) 01/19/2017  . CKD (chronic kidney disease), stage III    [facility]  . Cor pulmonale (HCC)    Possible  . Essential hypertension   . Essential tremor   . Foot ulcer, right ([place]) 03/2013  . Gout   . Hyperlipidemia   . Permanent atrial fibrillation (HCC)    a. >20 years, prior DCCVs unsuccessful, INR followed by PCP.  Marland Kitchen Type 2 diabetes mellitus [facility])     Assessment: 76 y/o M on warfarin PTA for history of afib, presented to [facility] on 03/20/17  with altered mental status. ON warfarin PTA for afib. INR 4.3 at outpatient anti-coag visit on 03/20/17. INR 3.72> 3.70>3.18,   trend down after no coumadin given x 3 days Hgb low/stable and pltc wnl. No bleeding noted Continues on  amiod 200 mg bid as on prior to admission  PTA Coumadin dose: 2.5mg  daily, last taken pta on 1/74  New metabolic encephalopathy w/delirium, Cirrhosis, with suspected portosystemic encephalopathy. Worsening mentation noted.  Goal of Therapy:  INR 2-3 Monitor platelets by  anticoagulation protocol: Yes   Plan:  Resume with low dose coumadin 0.5 mg today. Daily INR  Nicole Cella, RPh Clinical Pharmacist Pager: [phone] 8A-4P [phone] 4P-10P [place] [phone]  03/23/2017,1:39 PM

## 2017-03-23 NOTE — Progress Notes (Signed)
STROKE TEAM PROGRESS NOTE   HISTORY OF PRESENT ILLNESS (per record) Melvin Wang is a 76 y.o. male was admitted with an episode of unresponsiveness 2 weeks ago which was felt to be due to blood glucose of 23 at the time. While he was here, he developed atrial fibrillation with rates up to the 160s, started on amiodarone. He had a heart cath with normal coronaries. He was discharged after arriving home, has had recurrent episodes of confusion and unresponsiveness. He attributes these to simply feeling "tired" and thinks that a lot of fuss is being made about nothing.  His wife describes that today, he had an episode where he got off the toilet and walked out half dressed, he then held onto the wall and remain there for what seemed like a half hour and due to him not coming out of it, 911 was called.  While being admitted, he had another one of his episodes of unresponsiveness followed by some confusion and returned normal. For this reason I was consulted, and while is examining him, he was initially oriented and appropriate but then had an episode consisting of fluttering eyes, eyes are midline, head was slightly turned to the left, no abnormal movements of the extremities, speech and behavioral arrest. Following this, he was confused but able to follow commands. During the spell, I had the nurse take a blood pressure which read 099 systolic   SUBJECTIVE (INTERVAL HISTORY) His wife,I, daughter and son in laws at the bedside.   He needed sedation for agitation  OBJECTIVE Temp:  [97.3 F (36.3 C)-99.7 F (37.6 C)] 97.3 F (36.3 C) (04/15 0556) Pulse Rate:  [114-139] 127 (04/15 0556) Resp:  [18] 18 (04/15 0556) BP: (121-161)/(64-85) 141/83 (04/15 0556) SpO2:  [90 %-100 %] 91 % (04/15 0556) Weight:  [98 kg (216 lb 0.8 oz)] 98 kg (216 lb 0.8 oz) (04/15 0414)  CBC:   Recent Labs Lab 03/22/17 0511 03/23/17 0534  WBC 8.1 8.7  NEUTROABS 5.6 6.6  HGB 11.2* 11.3*  HCT 33.2* 33.3*  MCV  100.0 100.3*  PLT 181 833    Basic Metabolic Panel:   Recent Labs Lab 03/22/17 0511 03/23/17 0534  NA 136 139  K 3.7 3.3*  CL 96* 100*  CO2 29 30  GLUCOSE 96 111*  BUN 38* 33*  CREATININE 1.78* 1.67*  CALCIUM 9.1 8.8*    Lipid Panel:     Component Value Date/Time   CHOL 115 02/18/2017 1524   TRIG 72 02/18/2017 1524   TRIG 190 (H) 01/27/2015 1055   HDL 40 02/18/2017 1524   HDL 38 (L) 01/27/2015 1055   CHOLHDL 2.9 02/18/2017 1524   LDLCALC 61 02/18/2017 1524   HgbA1c:  Lab Results  Component Value Date   HGBA1C 5.4 01/19/2017   Urine Drug Screen: No results found for: LABOPIA, COCAINSCRNUR, LABBENZ, AMPHETMU, THCU, LABBARB  Alcohol Level No results found for: [place] I have personally reviewed the radiological images below and agree with the radiology interpretations.  Ct Abdomen Pelvis Wo Contrast 03/21/2017 1. Findings of hepatic cirrhosis with small amount of perihepatic ascites.  2. Pericardial effusion measuring up to 18 mm, new from the study of 01/20/2017.  3. Bilateral small pleural effusions with associated atelectasis.  4. Aortic atherosclerosis and cardiomegaly.   Dg Chest 2 View 03/20/2017 Unchanged appearance of left basilar atelectasis.   Ct Head Wo Contrast 03/21/2017 1. No acute intracranial abnormality.  2. Chronic microvascular ischemia and cerebral volume loss.  Mr Brain Wo Contrast 03/21/2017 1. Question punctate subacute white matter infarct in the posterior left pons.  2. No other acute or focal abnormality to explain the patient's symptoms. No acute or focal cortical infarcts.  3. Mild anterior sinus disease.   Mr Jodene Nam Head Wo Contrast 03/22/2017 1.  Negative intracranial MRA.  2. 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 No associated distal right ICA stenosis.   Transthoracic Echocardiogram 03/21/2017 Study Conclusions - Left ventricle: The cavity size was  normal. Wall thickness was   normal. Systolic function was normal. The estimated ejection   fraction was in the range of 50% to 55%. Wall motion was normal;   there were no regional wall motion abnormalities. - Ventricular septum: Septal motion showed paradox. The contour   showed diastolic flattening. These changes are consistent with RV   volume overload. - Mitral valve: There was mild to moderate regurgitation directed   eccentrically and posteriorly. - Left atrium: The atrium was severely dilated. - Right ventricle: The cavity size was severely dilated. Systolic   function was moderately reduced. - Right atrium: The atrium was severely dilated. - Tricuspid valve: Dilated annulus. There was malcoaptation of the   valve leaflets. There was severe regurgitation directed   centrally. - Pulmonary arteries: PA peak pressure: 43 mm Hg (S). - Inferior vena cava: The vessel was severely dilated. The   respirophasic diameter changes were blunted (< 50%), consistent   with elevated central venous pressure. - Pericardium, extracardiac: A moderate, partially loculated   pericardial effusion was identified circumferential to the heart.   The fluid contained focal strands.There was no evidence of   hemodynamic compromise.  Impressions:  - There is a moderate circumferential pericardial effusion. It   appears to be partially loculated in the posterior distribution.   Suspect that tamponade is not present. RV collapse is not seen.   Due to the irregular rhythm and the presence of severe TR and   pulmonary HTN, the sensitivity of echo techniques for the   diagnosis of tamponade may be seriously limited. Recommend   clinical assessment and possibly invasive hemodynamic evaluation,   if indicated.  CUS - pending  EEG - pending report    PHYSICAL EXAM  Temp:  [97.3 F (36.3 C)-99.7 F (37.6 C)] 97.3 F (36.3 C) (04/15 0556) Pulse Rate:  [114-139] 127 (04/15 0556) Resp:  [18] 18  (04/15 0556) BP: (121-161)/(64-85) 141/83 (04/15 0556) SpO2:  [90 %-100 %] 91 % (04/15 0556) Weight:  [98 kg (216 lb 0.8 oz)] 98 kg (216 lb 0.8 oz) (04/15 0414)  General - Well nourished, well developed, in no apparent distress.  Ophthalmologic - Fundi not visualized due to noncooperation.  Cardiovascular - irregularly irregular heart rate and rhythm.  Mental Status -  Patient is obtunded and sedated and can barely be aroused to open eyes but cannot sustain attention Cranial Nerves II - XII - II - Visual field intact OU. III, IV, VI - Extraocular movements intact. V - Facial sensation intact bilaterally. VII - Facial movement intact bilaterally. VIII - Hearing & vestibular intact bilaterally. X - Palate elevates symmetrically. XI - Chin turning & shoulder shrug intact bilaterally. XII - Tongue protrusion intact.  Motor Strength - The patient's strength was normal in all extremities and pronator drift was absent.  Bulk was normal and fasciculations were absent.   Motor Tone - Muscle tone was assessed at the neck and appendages  and was normal.  Reflexes - The patient's reflexes were 1+ in all extremities and he had no pathological reflexes.  Sensory - Light touch, temperature/pinprick were assessed and were symmetrical.    Coordination - The patient had normal movements in the hands with no ataxia or dysmetria.  b/l arm resting tremor and postural tremor noted, but no asterixis.  Gait and Station - deferred   ASSESSMENT/PLAN Melvin Wang is a 76 y.o. male with history of unresponsive episodes, diabetes mellitus, liver cirrhosis, atrial fibrillation on warfarin (INR - 3.72) , hyperlipidemia, congestive heart failure, hypertension, chronic kidney disease, gout, and essential tremor presenting with episodic confusion, unresponsiveness, and behavior changes. He did not receive IV t-PA due to no convincing evidence for acute stroke.  Questionable posterior tiny left pons DWI  changes - ? Tiny infarct, small vessel vs. Embolic due to afib. However, pt INR 3.70. This is an incidental finding and cannot explain the pt's symptoms.   MRI - Question punctate subacute white matter infarct in the posterior left pons.   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 Cardiology following.  LDL - 02/18/2017 - 61  HgbA1c - 01/19/2017 - 5.4  VTE prophylaxis - SCDs   Diet heart healthy/carb modified Room service appropriate? Yes; Fluid consistency: Thin; Fluid restriction: 1200 mL Fluid  warfarin daily prior to admission, now warfarin on hold due to high INR  Ongoing aggressive stroke risk factor management  Therapy recommendations: pending  Disposition: Pending  Metabolic encephalopathy / delirium / behavior changes  Intermittent confusion, thought fixation, behavior changes since last admission for cirrhosis with high ammonia  Episodes more frequent and lasting longer as per family  Most consistent with metabolic encephalopathy due to hepatic encephalopathy, hyperammonemia, right heart failure with moderate pericardial effusion, hypotension  Pt also likely to have baseline cognitive impairment  Treat underlying causes   Echo - EF 50 - 55%. Pericardial effusion. Cardiology on board. No cardiac source of emboli identified.  EEG pending report  Cirrhosis   Hx of ascites s/p drainage  Hx of hyperammonemia on lactoluse  Last ammonia 23 - repeat 26  Currently off lactoluse  Pericardia effusion, moderate  Found on CT and confirmed with TTE  Hypotension   Cardiology on board  ? Seizure   Concern of confusion episode could be seizure like activities  On vimpat, recommend to continue  EEG pending report  afib on coumadin   Coumadin on hold due to high INR and cirrhosis  coagulopathy  INR 3.70  Hypotension  BP 90s, pt has baseline low BP  Hyperlipidemia  Home meds: No lipid lowering medications prior to admission   LDL 61, goal < 70  Diabetes  HgbA1c 5.4, goal < 7.0  Controlled  Other Stroke Risk Factors  Advanced age  The patient quit smoking 32 years ago.  Obesity, Body mass index is 28.5 kg/m., recommend weight loss, diet and exercise as appropriate   Other Active Problems  Mild anemia - 11.3 / 33.3  Chronic kidney disease - 33 / 1.67  Hypokalemia - 3.3 - supplement.  Hospital day # 2 I have personally examined this patient, reviewed notes, independently viewed imaging studies, participated in medical decision making and plan of care.ROS completed by me personally and pertinent positives fully documented  I have made any additions  or clarifications directly to the above note. The patient`s prognosis is guarded given his medical issues. I had long d/w family about lack of correlation between is tint MRI difffusion abnormality and clinical exam which likely represents encephalopathy rater than stroke. They would like to meet with medical team and discuss goals of care and are open to discuss pallliative care approach if medical team agrees. D/w Dr Riccardo Dubin. Greater than 50% time during this 25 minute visit was spent on counselling and coordination of care about his abnormal MRI finding and answering questions. Antony Contras, MD Medical Director [facility] Pager: [phone] 03/23/2017 5:56 PM   To contact Stroke Continuity provider, please refer to [email or web address] After hours, contact General Neurology

## 2017-03-24 DIAGNOSIS — E860 Dehydration: Secondary | ICD-10-CM

## 2017-03-24 DIAGNOSIS — I2721 Secondary pulmonary arterial hypertension: Secondary | ICD-10-CM

## 2017-03-24 DIAGNOSIS — N179 Acute kidney failure, unspecified: Secondary | ICD-10-CM

## 2017-03-24 DIAGNOSIS — I5033 Acute on chronic diastolic (congestive) heart failure: Secondary | ICD-10-CM

## 2017-03-24 DIAGNOSIS — K7460 Unspecified cirrhosis of liver: Secondary | ICD-10-CM

## 2017-03-24 DIAGNOSIS — G9340 Encephalopathy, unspecified: Secondary | ICD-10-CM

## 2017-03-24 LAB — HEPATIC FUNCTION PANEL
ALT: 16 U/L — AB (ref 17–63)
AST: 30 U/L (ref 15–41)
Albumin: 2.6 g/dL — ABNORMAL LOW (ref 3.5–5.0)
Alkaline Phosphatase: 182 U/L — ABNORMAL HIGH (ref 38–126)
BILIRUBIN DIRECT: 0.7 mg/dL — AB (ref 0.1–0.5)
BILIRUBIN TOTAL: 2 mg/dL — AB (ref 0.3–1.2)
Indirect Bilirubin: 1.3 mg/dL — ABNORMAL HIGH (ref 0.3–0.9)
Total Protein: 7.3 g/dL (ref 6.5–8.1)

## 2017-03-24 LAB — CBC WITH DIFFERENTIAL/PLATELET
BASOS ABS: 0 10*3/uL (ref 0.0–0.1)
Basophils Relative: 0 %
EOS ABS: 0.1 10*3/uL (ref 0.0–0.7)
EOS PCT: 1 %
HCT: 34.6 % — ABNORMAL LOW (ref 39.0–52.0)
Hemoglobin: 11.4 g/dL — ABNORMAL LOW (ref 13.0–17.0)
Lymphocytes Relative: 12 %
Lymphs Abs: 1.2 10*3/uL (ref 0.7–4.0)
MCH: 33.6 pg (ref 26.0–34.0)
MCHC: 32.9 g/dL (ref 30.0–36.0)
MCV: 102.1 fL — AB (ref 78.0–100.0)
MONO ABS: 1 10*3/uL (ref 0.1–1.0)
Monocytes Relative: 10 %
Neutro Abs: 7.7 10*3/uL (ref 1.7–7.7)
Neutrophils Relative %: 77 %
PLATELETS: 218 10*3/uL (ref 150–400)
RBC: 3.39 MIL/uL — AB (ref 4.22–5.81)
RDW: 16.4 % — AB (ref 11.5–15.5)
WBC: 9.9 10*3/uL (ref 4.0–10.5)

## 2017-03-24 LAB — GLUCOSE, CAPILLARY
GLUCOSE-CAPILLARY: 148 mg/dL — AB (ref 65–99)
Glucose-Capillary: 113 mg/dL — ABNORMAL HIGH (ref 65–99)
Glucose-Capillary: 110 mg/dL — ABNORMAL HIGH (ref 65–99)
Glucose-Capillary: 110 mg/dL — ABNORMAL HIGH (ref 65–99)

## 2017-03-24 LAB — BASIC METABOLIC PANEL
Anion gap: 8 (ref 5–15)
BUN: 20 mg/dL (ref 6–20)
CALCIUM: 8.8 mg/dL — AB (ref 8.9–10.3)
CO2: 30 mmol/L (ref 22–32)
Chloride: 105 mmol/L (ref 101–111)
Creatinine, Ser: 1.34 mg/dL — ABNORMAL HIGH (ref 0.61–1.24)
GFR calc Af Amer: 59 mL/min — ABNORMAL LOW (ref >60)
GFR, EST NON AFRICAN AMERICAN: 51 mL/min — AB (ref >60)
GLUCOSE: 110 mg/dL — AB (ref 65–99)
POTASSIUM: 3.5 mmol/L (ref 3.5–5.1)
SODIUM: 143 mmol/L (ref 135–145)

## 2017-03-24 LAB — AMMONIA: AMMONIA: 46 umol/L — AB (ref 9–35)

## 2017-03-24 LAB — PROTIME-INR
INR: 3.57
Prothrombin Time: 36.5 seconds — ABNORMAL HIGH (ref 11.4–15.2)

## 2017-03-24 MED ORDER — OLANZAPINE 10 MG IM SOLR
5.0000 mg | Freq: Every day | INTRAMUSCULAR | Status: DC
  Administered 2017-03-24 – 2017-03-25 (×2): 5 mg via INTRAMUSCULAR
  Filled 2017-03-24 (×3): qty 10

## 2017-03-24 MED ORDER — M.V.I. ADULT IV INJ
INJECTION | INTRAVENOUS | Status: DC
  Administered 2017-03-24: 13:00:00 via INTRAVENOUS
  Filled 2017-03-24: qty 10

## 2017-03-24 MED ORDER — FUROSEMIDE 10 MG/ML IJ SOLN
60.0000 mg | Freq: Two times a day (BID) | INTRAMUSCULAR | Status: DC
  Administered 2017-03-24 (×2): 60 mg via INTRAVENOUS
  Filled 2017-03-24 (×3): qty 6

## 2017-03-24 MED ORDER — HALOPERIDOL LACTATE 5 MG/ML IJ SOLN
1.0000 mg | INTRAMUSCULAR | Status: DC | PRN
Start: 2017-03-24 — End: 2017-03-26

## 2017-03-24 MED ORDER — THIAMINE HCL 100 MG/ML IJ SOLN
100.0000 mg | Freq: Every day | INTRAMUSCULAR | Status: DC
  Administered 2017-03-24 – 2017-03-25 (×2): 100 mg via INTRAVENOUS
  Filled 2017-03-24 (×2): qty 2

## 2017-03-24 MED ORDER — HALOPERIDOL 1 MG PO TABS
1.0000 mg | ORAL_TABLET | ORAL | Status: DC | PRN
  Filled 2017-03-24: qty 1

## 2017-03-24 NOTE — Progress Notes (Signed)
STROKE TEAM PROGRESS NOTE   HISTORY OF PRESENT ILLNESS (per record) Melvin Wang is a 76 y.o. male was admitted with an episode of unresponsiveness 2 weeks ago which was felt to be due to blood glucose of 23 at the time. While he was here, he developed atrial fibrillation with rates up to the 160s, started on amiodarone. He had a heart cath with normal coronaries. He was discharged after arriving home, has had recurrent episodes of confusion and unresponsiveness. He attributes these to simply feeling "tired" and thinks that a lot of fuss is being made about nothing.  His wife describes that today, he had an episode where he got off the toilet and walked out half dressed, he then held onto the wall and remain there for what seemed like a half hour and due to him not coming out of it, 911 was called.  While being admitted, he had another one of his episodes of unresponsiveness followed by some confusion and returned normal. For this reason I was consulted, and while is examining him, he was initially oriented and appropriate but then had an episode consisting of fluttering eyes, eyes are midline, head was slightly turned to the left, no abnormal movements of the extremities, speech and behavioral arrest. Following this, he was confused but able to follow commands. During the spell, I had the nurse take a blood pressure which read 621 systolic   SUBJECTIVE (INTERVAL HISTORY) His wife is at the bedside.   He Is much more alert today  OBJECTIVE Temp:  [97.2 F (36.2 C)-98.1 F (36.7 C)] 97.2 F (36.2 C) (04/16 0520) Pulse Rate:  [69-139] 139 (04/16 0520) Resp:  [18-19] 18 (04/16 0520) BP: (118-128)/(67-70) 118/67 (04/16 0520) SpO2:  [89 %-95 %] 89 % (04/16 0520) Weight:  [212 lb 1.3 oz (96.2 kg)] 212 lb 1.3 oz (96.2 kg) (04/16 0300)  CBC:   Recent Labs Lab 03/23/17 0534 03/24/17 0514  WBC 8.7 9.9  NEUTROABS 6.6 7.7  HGB 11.3* 11.4*  HCT 33.3* 34.6*  MCV 100.3* 102.1*  PLT 208 218     Basic Metabolic Panel:   Recent Labs Lab 03/23/17 0534 03/24/17 0514  NA 139 143  K 3.3* 3.5  CL 100* 105  CO2 30 30  GLUCOSE 111* 110*  BUN 33* 20  CREATININE 1.67* 1.34*  CALCIUM 8.8* 8.8*    Lipid Panel:     Component Value Date/Time   CHOL 115 02/18/2017 1524   TRIG 72 02/18/2017 1524   TRIG 190 (H) 01/27/2015 1055   HDL 40 02/18/2017 1524   HDL 38 (L) 01/27/2015 1055   CHOLHDL 2.9 02/18/2017 1524   LDLCALC 61 02/18/2017 1524   HgbA1c:  Lab Results  Component Value Date   HGBA1C 5.4 01/19/2017   Urine Drug Screen: No results found for: LABOPIA, COCAINSCRNUR, LABBENZ, AMPHETMU, THCU, LABBARB  Alcohol Level No results found for: [place] I have personally reviewed the radiological images below and agree with the radiology interpretations.  Ct Abdomen Pelvis Wo Contrast 03/21/2017 1. Findings of hepatic cirrhosis with small amount of perihepatic ascites.  2. Pericardial effusion measuring up to 18 mm, new from the study of 01/20/2017.  3. Bilateral small pleural effusions with associated atelectasis.  4. Aortic atherosclerosis and cardiomegaly.   Dg Chest 2 View 03/20/2017 Unchanged appearance of left basilar atelectasis.   Ct Head Wo Contrast 03/21/2017 1. No acute intracranial abnormality.  2. Chronic microvascular ischemia and cerebral volume loss.   Mr  Brain Wo Contrast 03/21/2017 1. Question punctate subacute white matter infarct in the posterior left pons.  2. No other acute or focal abnormality to explain the patient's symptoms. No acute or focal cortical infarcts.  3. Mild anterior sinus disease.   Mr Jodene Nam Head Wo Contrast 03/22/2017 1.  Negative intracranial MRA.  2. 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 No associated distal right ICA stenosis.   Transthoracic Echocardiogram 03/21/2017 Study Conclusions - Left ventricle: The cavity size was normal. Wall thickness was    normal. Systolic function was normal. The estimated ejection   fraction was in the range of 50% to 55%. Wall motion was normal;   there were no regional wall motion abnormalities. - Ventricular septum: Septal motion showed paradox. The contour   showed diastolic flattening. These changes are consistent with RV   volume overload. - Mitral valve: There was mild to moderate regurgitation directed   eccentrically and posteriorly. - Left atrium: The atrium was severely dilated. - Right ventricle: The cavity size was severely dilated. Systolic   function was moderately reduced. - Right atrium: The atrium was severely dilated. - Tricuspid valve: Dilated annulus. There was malcoaptation of the   valve leaflets. There was severe regurgitation directed   centrally. - Pulmonary arteries: PA peak pressure: 43 mm Hg (S). - Inferior vena cava: The vessel was severely dilated. The   respirophasic diameter changes were blunted (< 50%), consistent   with elevated central venous pressure. - Pericardium, extracardiac: A moderate, partially loculated   pericardial effusion was identified circumferential to the heart.   The fluid contained focal strands.There was no evidence of   hemodynamic compromise.  Impressions:  - There is a moderate circumferential pericardial effusion. It   appears to be partially loculated in the posterior distribution.   Suspect that tamponade is not present. RV collapse is not seen.   Due to the irregular rhythm and the presence of severe TR and   pulmonary HTN, the sensitivity of echo techniques for the   diagnosis of tamponade may be seriously limited. Recommend   clinical assessment and possibly invasive hemodynamic evaluation,   if indicated.  CUS - pending  EEG - pending report    PHYSICAL EXAM  Temp:  [97.2 F (36.2 C)-98.1 F (36.7 C)] 97.2 F (36.2 C) (04/16 0520) Pulse Rate:  [69-139] 139 (04/16 0520) Resp:  [18-19] 18 (04/16 0520) BP:  (118-128)/(67-70) 118/67 (04/16 0520) SpO2:  [89 %-95 %] 89 % (04/16 0520) Weight:  [212 lb 1.3 oz (96.2 kg)] 212 lb 1.3 oz (96.2 kg) (04/16 0300)  General - Well nourished, well developed, in no apparent distress.  Ophthalmologic - Fundi not visualized due to noncooperation.  Cardiovascular - irregularly irregular heart rate and rhythm.  Mental Status -  Patient is awake alert oriented 2. Dementia tension, registration and recall. Speech is clear without dysarthria or aphasia. Cranial Nerves II - XII - II - Visual field intact OU. III, IV, VI - Extraocular movements intact. V - Facial sensation intact bilaterally. VII - Facial movement intact bilaterally. VIII - Hearing & vestibular intact bilaterally. X - Palate elevates symmetrically. XI - Chin turning & shoulder shrug intact bilaterally. XII - Tongue protrusion intact.  Motor Strength - The patient's strength was normal in all extremities and pronator drift was absent.  Bulk was normal and fasciculations were absent.   Motor Tone - Muscle tone was assessed at the neck and appendages  and was normal.  Reflexes - The patient's reflexes were 1+ in all extremities and he had no pathological reflexes.  Sensory - Light touch, temperature/pinprick were assessed and were symmetrical.    Coordination - The patient had normal movements in the hands with no ataxia or dysmetria.  b/l arm resting tremor and postural tremor noted, but no asterixis.  Gait and Station - deferred   ASSESSMENT/PLAN Melvin Wang is a 76 y.o. male with history of unresponsive episodes, diabetes mellitus, liver cirrhosis, atrial fibrillation on warfarin (INR - 3.72) , hyperlipidemia, congestive heart failure, hypertension, chronic kidney disease, gout, and essential tremor presenting with episodic confusion, unresponsiveness, and behavior changes. He did not receive IV t-PA due to no convincing evidence for acute stroke.  Questionable posterior tiny left  pons DWI changes - ? Tiny infarct, small vessel vs. Embolic due to afib. However, pt INR 3.70. This is an incidental finding and cannot explain the pt's symptoms.   MRI - Question punctate subacute white matter infarct in the posterior left pons.   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 Vertebral artery flow is antegrade.  2D Echo - EF 50-55% with moderate pericardial effusion. Cardiology following.  LDL - 02/18/2017 - 61  HgbA1c - 01/19/2017 - 5.4  VTE prophylaxis - SCDs   Diet heart healthy/carb modified Room service appropriate? Yes; Fluid consistency: Thin; Fluid restriction: 1200 mL Fluid  warfarin daily prior to admission, now warfarin on hold due to high INR  Ongoing aggressive stroke risk factor management  Therapy recommendations: pending  Disposition: Pending  Metabolic encephalopathy / delirium / behavior changes  Intermittent confusion, thought fixation, behavior changes since last admission for cirrhosis with high ammonia  Episodes more frequent and lasting longer as per family  Most consistent with metabolic encephalopathy due to hepatic encephalopathy, hyperammonemia, right heart failure with moderate pericardial effusion, hypotension  Pt also likely to have baseline cognitive impairment  Treat underlying causes   Echo - EF 50 - 55%. Pericardial effusion. Cardiology on board. No cardiac source of emboli identified.  EEG pending report  Cirrhosis   Hx of ascites s/p drainage  Hx of hyperammonemia on lactoluse  Last ammonia 23 - repeat 26  Currently off lactoluse  Pericardia effusion, moderate  Found on CT and confirmed with TTE  Hypotension   Cardiology on board  ? Seizure   Concern of confusion episode could be seizure like activities  On vimpat, recommend to continue  EEG pending report  afib on  coumadin   Coumadin on hold due to high INR and cirrhosis coagulopathy  INR 3.70  Hypotension  BP 90s, pt has baseline low BP  Hyperlipidemia  Home meds: No lipid lowering medications prior to admission   LDL 61, goal < 70  Diabetes  HgbA1c 5.4, goal < 7.0  Controlled  Other Stroke Risk Factors  Advanced age  The patient quit smoking 32 years ago.  Obesity, Body mass index is 27.98 kg/m., recommend weight loss, diet and exercise as appropriate   Other Active Problems  Mild anemia - 11.3 / 33.3  Chronic kidney disease - 33 / 1.67  Hypokalemia - 3.3 - supplement.  Hospital day # 3 I have personally examined this patient, reviewed notes, independently viewed imaging studies, participated in medical decision making and plan of care.ROS completed by me personally and pertinent positives fully  documented  I have made any additions or clarifications directly to the above note. The patient`s prognosis is guarded given his medical issues. I had long d/w family about lack of correlation between is tint MRI difffusion abnormality and clinical exam which likely represents encephalopathy rater than stroke. They would like to meet with medical team and discuss goals of care and are open to discuss pallliative care approach if medical team agrees. D/w Dr Riccardo Dubin who since patient is improving he would like to observe the patient for next few days and make a final decision. Stroke team will sign off. Kindly call for questions. Antony Contras, MD Medical Director [facility] Pager: [phone] 03/24/2017 3:31 PM   To contact Stroke Continuity provider, please refer to [email or web address] After hours, contact General Neurology

## 2017-03-24 NOTE — Progress Notes (Signed)
Came back by to check on patient.   Much more clear. Alert to person, place, and time.   500 cc of clear urine in condom cath receptacle. Has been compliant with his meds so far today.  No change to current plan.   Legrand Como [address]" [place], PA-C 03/24/2017 1:53 PM

## 2017-03-24 NOTE — Progress Notes (Signed)
Advanced Heart Failure Rounding Note  PCP: Claretta Fraise, MD Primary Cardiologist: Dr. Haroldine Laws   Subjective:    On my arrival to room, pt is naked, having thrown off all of his covers, telemetry, and ? Foley.  Per IM note developed worsening encephalopathy over the weekend. Requiring antipsychotics. Per wife pt has been refusing his po meds. Belly more bloated. More SOB.  Per pt "feels like nothing" this morning.   Weight shows down 9 lbs, but has progressed from standing weight to bed weight with worsening encephalopathy.   Creatinine has improved from 1.3 today from 2.2 on admission. He is overall positive so suspect this is volume accumulation with refusal of po meds.   MRA 03/22/17 with no intracranial significance. 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   MRI 03/21/17 with ? Punctate subacute white matter infarct in the posterior left pons. + mild anterior sinus disease.   EEG 03/21/17, completed. No interpretation available in system.  Echo 03/21/17 with EF 50-55%, RV volume overloaded, + moderate, partially loculated pericardial effusion without tamponade.   Objective:   Weight Range: 212 lb 1.3 oz (96.2 kg) Body mass index is 27.98 kg/m.   Vital Signs:   Temp:  [97.2 F (36.2 C)-98.5 F (36.9 C)] 97.2 F (36.2 C) (04/16 0520) Pulse Rate:  [69-139] 139 (04/16 0520) Resp:  [18-19] 18 (04/16 0520) BP: (115-128)/(53-70) 118/67 (04/16 0520) SpO2:  [89 %-95 %] 89 % (04/16 0520) Weight:  [212 lb 1.3 oz (96.2 kg)] 212 lb 1.3 oz (96.2 kg) (04/16 0300) Last BM Date: 03/22/17  Weight change: Filed Weights   03/22/17 0204 03/23/17 0414 03/24/17 0300  Weight: 227 lb 8.2 oz (103.2 kg) 216 lb 0.8 oz (98 kg) 212 lb 1.3 oz (96.2 kg)    Intake/Output:   Intake/Output Summary (Last 24 hours) at 03/24/17 6962 Last data filed at 03/24/17 9528  Gross per 24 hour  Intake           1257.5 ml  Output                 0 ml  Net           1257.5 ml     Physical Exam: General:  Caucasian male, Elderly, chronically ill, and fatigued appearing.  HEENT: Normocephalic Neck: Supple. JVP elevated to ear. Carotids 2+ bilat; no bruits. No thyromegaly or nodule noted.  Cor: PMI non-displaced. Irregualrly irregular and tachy. 2/6 TR and + RV lift.  + friction rub, though less discernible with tachycardia.  Lungs: Diminished throughout.  Abdomen: Soft, non-tender, + distention, No hepatosplenomegaly. No bruits or masses. Good bowel sounds. Extremities: no cyanosis, clubbing, rash. Trace to 1+ edema. + asterixis.  Neuro: Alert to person and self.Marland Kitchen Responds to questioning.  Moves extremities without difficulty. Affect flat.   Telemetry: Not currently connected.   Labs: CBC  Recent Labs  03/23/17 0534 03/24/17 0514  WBC 8.7 9.9  NEUTROABS 6.6 7.7  HGB 11.3* 11.4*  HCT 33.3* 34.6*  MCV 100.3* 102.1*  PLT 208 413   Basic Metabolic Panel  Recent Labs  03/23/17 0534 03/24/17 0514  NA 139 143  K 3.3* 3.5  CL 100* 105  CO2 30 30  GLUCOSE 111* 110*  BUN 33* 20  CREATININE 1.67* 1.34*  CALCIUM 8.8* 8.8*   Liver Function Tests No results for input(s): AST, ALT, ALKPHOS, BILITOT, PROT, ALBUMIN in the last 72 hours.  No results for input(s): LIPASE, AMYLASE in the last 72 hours. Cardiac Enzymes  Recent Labs  03/21/17 1036 03/21/17 1647  TROPONINI <0.03 0.03*    BNP: BNP (last 3 results) No results for input(s): BNP in the last 8760 hours.  ProBNP (last 3 results) No results for input(s): PROBNP in the last 8760 hours.   D-Dimer No results for input(s): DDIMER in the last 72 hours. Hemoglobin A1C No results for input(s): HGBA1C in the last 72 hours. Fasting Lipid Panel No results for input(s): CHOL, HDL, LDLCALC, TRIG, CHOLHDL, LDLDIRECT in the last 72 hours. Thyroid Function Tests No results for input(s): TSH, T4TOTAL, T3FREE, THYROIDAB in the last 72 hours.  Invalid input(s):  FREET3  Other results:     Imaging/Studies:   No results found.    Medications:     Scheduled Medications: . allopurinol  300 mg Oral Daily  . amiodarone  200 mg Oral BID  . calcitRIOL  0.5 mcg Oral Daily  . colchicine  0.6 mg Oral BID  . febuxostat  40 mg Oral Daily  . insulin aspart  0-9 Units Subcutaneous TID WC  . lacosamide (VIMPAT) IV  100 mg Intravenous Q12H  . lactulose  30 g Oral TID  . OLANZapine  10 mg Oral QHS  . pantoprazole  40 mg Oral BID AC  . potassium chloride SA  20 mEq Oral BID  . warfarin  0.5 mg Oral ONCE-1800  . Warfarin - Pharmacist Dosing Inpatient   Does not apply q1800     Infusions: . dextrose 5 % and 0.9% NaCl 50 mL/hr at 03/23/17 1216     PRN Medications:  acetaminophen **OR** acetaminophen, haloperidol **OR** haloperidol lactate   Assessment/Plan   Melvin Wang is a 76 y.o. male with PMH of Diastolic CHF with end-stage RV failure, Chronic afib, CKD stage III, DM2, resting tremor, and cirrhosis who presented to [facility] after episode of confusion. Incidental pericardial effusion noted on CT ABD/Pelvis  1. AMS with seizure like activity - Now with worsening encephalopathy. Refusing po meds. Has haldol ordered prn and now on Zypexa.  - MRI 03/21/17 with ? Punctate subacute white matter infarct in the posterior pons. No other acute abnormality.  - Neuro following. EEG pending report.  - Repeat LFTs.  - Baseline cognitive impairment also noted.  2. Acute pericarditis with effusion - Unclear cause. Sinus disease noted on MRI head. ? Viral cause with reported URI.  - Effusion incidentally on CT ABD/pel.  At its widest point is 18 mm.Echo without tamponade.  - Continue colchicine. + Friction rub on exam.  3. 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  - Will order 60 mg IV lasix BID.   4. Chronic Afib - Continue amiodarone 200 mg BID for rate control. Need to be careful  with liver failure.  - Rate up this am in setting of refusing all meds yesterday.  - INR remains 3.57 despite being held for several days. Not clearing with cirrhosis and RV failure.  - Continue coumadin.  5. CKD stage III - Improved from admission, likely due to volume overload.  6. Cirrhosis with hepatic encephalopathy  - Now worse, ammonia climbing. + asterixis and worsening baseline tremor on exam.  - Pt has been refusing po meds. Needs his lactulose.    Pt is very ill with metabolic encephalopathy and end stage RV failure.  Will diurese today. Stressed importance of his po medications.  Pt is in imminent danger of  MSOF with his large burden of comorbidities.   Length of Stay: 3  Annamaria Helling  03/24/2017, 8:33 AM  Advanced Heart Failure Team Pager [phone] (M-F; 7a - 4p)  Please contact [facility] for night-coverage after hours (4p -7a ) and weekends on [email or web address]  Patient seen and examined with the above-signed Advanced Practice Provider and/or Housestaff. I personally reviewed laboratory data, imaging studies and relevant notes. I independently examined the patient and formulated the important aspects of the plan. I have edited the note to reflect any of my changes or salient points. I have personally discussed the plan with the patient and/or family.  He continues to struggle with acute delirium/encephalopathy of unclear etiology. Ammonia down so less likely hepatic encephalopathy. CVA is small so doubt that is cause either. I wonder if it is related to meds, sundowning or overall deterioration. Long talk with him and his family about possible etiologies. They are aware it might get worse. We reviewed Neuro's input as well.   From HF perspective. Volume status back up. Will diurese gently. He is preload dependent with RV failure so will not over diurese. Remains in chronic AF. Was on amio for rate control but may have to stop if mental status not improving.   His  family is aware of poor prognosis and are open to further hospice talks if not improving over next 48-72 hours.   Glori Bickers, MD  10:20 PM

## 2017-03-24 NOTE — Progress Notes (Signed)
Mitts were placed on pt at approx 10:55 d/t pulling at iv and condom cath. Condom cath reapplied and in place. Will ctm

## 2017-03-24 NOTE — Progress Notes (Signed)
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 In the ED developed 3 episodes of altered mentation, eyes fluttering, not verbal but following commands. Lasting 3 to 4 minutes. On examination hemodynamically stable. Neurology consulted and patient started on Vimpat. MRI with small punctuate subacute white matter infarct in the posterior left pons. Patient has developed worsening encephalopathy with delirium, requiring antipsycotics.    Assessment & Plan:   Principal Problem:   Seizure ([place]) Active Problems:   Essential hypertension   Permanent atrial fibrillation (HCC)   Anemia   Cirrhosis of liver without ascites (HCC)   Type 2 diabetes mellitus with renal complication (HCC)   RVF (right ventricular failure) (HCC)   PAH (pulmonary artery hypertension) (HCC)   Weakness   Confusion   Cerebral thrombosis with cerebral infarction   1. New Metabolic encephalopathy with delirium. Patient with persistent encephalopathy, had episode of lucidity last night with his wife, will continue lactulose therapy as tolerated and continue neuro checks. Continue haldol at 1 mg as needed IM or PO for agitation and will change olanzapine to 5 mg IM tonight, will continue hydration with dextrose and will add multivitamins, including thiamine.  2. New onset seizures. Will change Vimpat to IV for now. Continue neuro checks.   3. Subacute CVA, punctate subacute white matter infarct in the posterior left pons.No significant carotid stenosis, case discussed with neurology stroke team, unlikely to be CVA, will continue neuro checks, aspiration precautions.    4. Systolic heart failure chronic and stable. Recommend to continue to hold on diuresis. Patient with poor  oral intake and encephalopathy. No hemodynamically significant pericardial effusion.  5. Paroxysmal atrial fibrillation. Continue amiodarone as tolerated. Anticoagulation with warfarin, INR at 3.65. Further dosing of warfarin per pharmacy protocol.  6. 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 Ammonia this am 46, lactulose therapy, neuro checks and medical supportive care. Aspiration precautions.   7. HTN.  Hold on antihypertensive agents and diuresis. Systolic blood pressure 017 to 120.   8. CKD stage 3.Renal function with cr at 1,34 with serum bicarbonate 30. Will continue gentle hydration with dextrose isotonic saline will recommend to hold on diuresis for now.    9. T2DM. Glucose cover and monitoring with sliding scale. Capillary glucose 121, 113, 110.    DVT prophylaxis:on warfarin at home  Code Status:full  Family Communication:I spoke with patient's family at the bedside and all questions were addressed.  Disposition Plan:home    Consultants:  Neurology  Cardiology  Subjective: Patient has been sedated, somnolent with episodes of agitation. Last night was able to speak to his wife for a brief period of time. Has been pulling IV's. Uncomfortable with the gown and light.   Objective: Vitals:   03/23/17 1342 03/23/17 2141 03/24/17 0300 03/24/17 0520  BP: (!) 115/53 128/70  118/67  Pulse: (!) 104 69  (!) 139  Resp: 18 19  18   Temp: 98.5 F (36.9 C) 98.1 F (36.7 C)  97.2 F (36.2 C)  TempSrc:  Oral    SpO2: 92% 95%  (!) 89%  Weight:   96.2 kg (212 lb 1.3 oz)   Height:        Intake/Output Summary (Last 24 hours) at 03/24/17 1025 Last data filed at 03/24/17 [phone]  Gross per 24 hour  Intake           1257.5 ml  Output                0 ml  Net           1257.5 ml   Filed Weights   03/22/17 0204 03/23/17 0414 03/24/17 0300  Weight: 103.2 kg (227 lb 8.2 oz) 98 kg (216 lb 0.8 oz) 96.2 kg (212 lb 1.3 oz)    Examination:  General exam:  deconditioned and ill looking appaering E ENT: positive pallor, no icterus, oral mucosa dry.  Respiratory system: Bibasilar rales. No rhonchi or wheezing. Poor inspiratory effort.  Cardiovascular system: S1 & S2 heard, RRR. No JVD, murmurs, rubs, gallops or clicks. No pedal edema. Gastrointestinal system: Abdomen has mild distention, soft and nontender. No organomegaly or masses felt. Normal bowel sounds heard. Central nervous system: confused not interactive, not following commands.  Extremities: Symmetric 5 x 5 power. Skin: No rashes, lesions or ulcers     Data Reviewed: I have personally reviewed following labs and imaging studies  CBC:  Recent Labs Lab 03/18/17 0849 03/20/17 2114 03/21/17 0444 03/22/17 0511 03/23/17 0534 03/24/17 0514  WBC 8.3 10.6* 8.1 8.1 8.7 9.9  NEUTROABS 5.7  --  5.7 5.6 6.6 7.7  HGB  --  11.4* 11.0* 11.2* 11.3* 11.4*  HCT 34.4* 33.6* 32.1* 33.2* 33.3* 34.6*  MCV 100* 99.4 99.4 100.0 100.3* 102.1*  PLT 252 245 174 181 208 409   Basic Metabolic Panel:  Recent Labs Lab 03/20/17 2114 03/21/17 0444 03/22/17 0511 03/23/17 0534 03/24/17 0514  NA 132* 132* 136 139 143  K 4.1 3.7 3.7 3.3* 3.5  CL 90* 97* 96* 100* 105  CO2 26 25 29 30 30   GLUCOSE 85 77 96 111* 110*  BUN 54* 51* 38* 33* 20  CREATININE 2.27* 2.05* 1.78* 1.67* 1.34*  CALCIUM 9.6 8.6* 9.1 8.8* 8.8*   GFR: Estimated Creatinine Clearance: 60 mL/min (A) (by C-G formula based on SCr of 1.34 mg/dL (H)). Liver Function Tests:  Recent Labs Lab 03/18/17 0849 03/20/17 2114 03/21/17 0444 03/24/17 0514  AST 21 27 22 30   ALT 16 17 16* 16*  ALKPHOS 267* 224* 205* 182*  BILITOT 1.6* 2.3* 2.1* 2.0*  PROT 7.3 7.5 6.7 7.3  ALBUMIN 3.1* 2.9* 2.5* 2.6*    Recent Labs Lab 03/20/17 2114  LIPASE 41    Recent Labs Lab 03/18/17 0849 03/20/17 2114 03/22/17 1154 03/23/17 0534 03/24/17 0514  AMMONIA 36 23 30 26  46*   Coagulation Profile:  Recent Labs Lab 03/20/17 1203  03/21/17 0452 03/22/17 0511 03/23/17 0534 03/24/17 0514  INR 4.3* 3.72 3.70 3.18 3.57   Cardiac Enzymes:  Recent Labs Lab 03/21/17 0444 03/21/17 1036 03/21/17 1647  TROPONINI 0.03* <0.03 0.03*   BNP (last 3 results) No results for input(s): PROBNP in the last 8760 hours. HbA1C: No results for input(s): HGBA1C in the last 72 hours. CBG:  Recent Labs Lab 03/22/17 1701 03/22/17 2158 03/23/17 1159 03/23/17 2137 03/24/17 0823  GLUCAP 104* 137* 88 121* 113*   Lipid Profile: No results for input(s): CHOL, HDL, LDLCALC, TRIG, CHOLHDL, LDLDIRECT in the last 72 hours. Thyroid Function Tests: No results for input(s): TSH, T4TOTAL, FREET4, T3FREE, THYROIDAB in the last 72 hours. Anemia Panel: No results for input(s): VITAMINB12, FOLATE, FERRITIN, TIBC, IRON, RETICCTPCT in the last 72 hours. Sepsis Labs:  Recent Labs Lab 03/20/17 2354 03/21/17 0444 03/21/17 1036  LATICACIDVEN  1.45 1.1 1.4    No results found for this or any previous visit (from the past 240 hour(s)).       Radiology Studies: Mr Virgel Paling OF Contrast  Result Date: 03/22/2017 CLINICAL DATA:  76 year old male with questionable left dorsal pons lacunar infarct on brain MRI yesterday for abnormal speech and confusion. EXAM: MRA HEAD WITHOUT CONTRAST TECHNIQUE: Angiographic images of the Circle of Willis were obtained using MRA technique without intravenous contrast. COMPARISON:  Brain MRI 03/21/2017. FINDINGS: No intracranial mass effect or ventriculomegaly. Antegrade flow in the posterior circulation. Mildly dominant distal left vertebral artery. Tortuous vertebrobasilar junction with no distal vertebral or basilar irregularity or stenosis. Left PICA and dominant appearing AICA origins are patent. Normal SCA and right PCA origins. Fetal type left PCA origin. Bilateral PCA branches are within normal limits. Right posterior communicating artery is diminutive or absent. Antegrade flow in both ICA siphons. Small 5-6  mm pseudoaneurysm of the distal cervical right ICA suspected on series 3, image 13. No distal cervical ICA stenosis identified. Tortuous cavernous segments. No ICA siphon stenosis. Ophthalmic and left posterior communicating artery origins are normal. Patent carotid termini. Normal MCA and ACA origins. Anterior communicating artery and visible ACA branches are within normal limits. Early left MCA bifurcation. Left MCA M1 segment and visible left MCA branches are within normal limits. Right MCA M1 segment, right MCA bifurcation, and visible right MCA branches are within normal limits. IMPRESSION: 1.  Negative intracranial MRA. 2. 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 No associated distal right ICA stenosis. Electronically Signed   By: Genevie Ann M.D.   On: 03/22/2017 14:29        Scheduled Meds: . allopurinol  300 mg Oral Daily  . amiodarone  200 mg Oral BID  . calcitRIOL  0.5 mcg Oral Daily  . colchicine  0.6 mg Oral BID  . febuxostat  40 mg Oral Daily  . furosemide  60 mg Intravenous BID  . insulin aspart  0-9 Units Subcutaneous TID WC  . lacosamide (VIMPAT) IV  100 mg Intravenous Q12H  . lactulose  30 g Oral TID  . OLANZapine  10 mg Oral QHS  . pantoprazole  40 mg Oral BID AC  . warfarin  0.5 mg Oral ONCE-1800  . Warfarin - Pharmacist Dosing Inpatient   Does not apply q1800   Continuous Infusions: . dextrose 5 % and 0.9% NaCl 50 mL/hr at 03/23/17 1216     LOS: 3 days        Mackenzey Crownover Gerome Apley, MD [facility] Pager [phone]  If 7PM-7AM, please contact night-coverage [email or web address] Password [facility] 03/24/2017, 10:25 AM

## 2017-03-24 NOTE — Progress Notes (Signed)
ANTICOAGULATION CONSULT NOTE -Follow up  Pharmacy Consult for Warfarin  Indication: atrial fibrillation   Patient Measurements: Height: 6\' 1"  (185.4 cm) Weight: 212 lb 1.3 oz (96.2 kg) IBW/kg (Calculated) : 79.9  Vital Signs: Temp: 97.2 F (36.2 C) (04/16 0520) BP: 118/67 (04/16 0520) Pulse Rate: 139 (04/16 0520)  Labs:  Recent Labs  03/21/17 1036 03/21/17 1647  03/22/17 0511 03/23/17 0534 03/24/17 0514  HGB  --   --   < > 11.2* 11.3* 11.4*  HCT  --   --   --  33.2* 33.3* 34.6*  PLT  --   --   --  181 208 218  LABPROT  --   --   --  37.6* 33.3* 36.5*  INR  --   --   --  3.70 3.18 3.57  CREATININE  --   --   --  1.78* 1.67* 1.34*  TROPONINI <0.03 0.03*  --   --   --   --   < > = values in this interval not displayed.  Estimated Creatinine Clearance: 60 mL/min (A) (by C-G formula based on SCr of 1.34 mg/dL (H)).  Assessment: 76 y/o M on warfarin PTA for history of afib, [facility]  with altered mental status. PTA Coumadin dose: 2.5mg  daily, last taken pta on 4/11- INR 4.3 at outpatient anti-coag visit on 4/12.  Warfarin has been held here for elevated INRs, was to restart yesterday with trend down however patient was unable to take PO medications d/t mental status.  INR back up today to 3.57. Hgb 11.4, plts 218- no bleeding noted.  Goal of Therapy:  INR 2-3 Monitor platelets by anticoagulation protocol: Yes   Plan:  Hold warfarin again tonight  Daily INR and CBC Follow mental status and medications  Shaun Runyon D. Kanika Bungert, PharmD, BCPS Clinical Pharmacist Pager: [phone] 03/24/2017 10:29 AM

## 2017-03-24 NOTE — Consult Note (Signed)
   [facility] Inpatient Consult   03/24/2017  RAEGAN SIPP 11/01/1941 [id number]  Patient assessed for multiple admissions and re-admission in the past 6 months, 3 noted in electronic medical record.  Currently the patient remains confused and disposition has.not been determined at this time.   Patient listed on [employer] [place]. 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 In the ED developed 3 episodes of altered mentation, eyes fluttering, not verbal but followingcommands. Lasting 3 to 4 minutes. On examination hemodynamically stable. Neurology consulted and patient started on Vimpat. MRI with small punctuate subacute white matter infarct in the posterior left pons.Patient has developed worsening encephalopathy with delirium, requiring antipsycotics.  Will follow for disposition and needs if appropriate.  For questions, please contact:  Natividad Brood, RN BSN [facility] Liaison  [phone] business mobile phone Toll free office [phone]

## 2017-03-25 ENCOUNTER — Encounter (HOSPITAL_COMMUNITY): Payer: Medicare Other | Admitting: Internal Medicine

## 2017-03-25 ENCOUNTER — Ambulatory Visit: Payer: Medicare Other | Admitting: Family Medicine

## 2017-03-25 DIAGNOSIS — I5032 Chronic diastolic (congestive) heart failure: Secondary | ICD-10-CM

## 2017-03-25 LAB — CBC WITH DIFFERENTIAL/PLATELET
BASOS PCT: 0 %
Basophils Absolute: 0 10*3/uL (ref 0.0–0.1)
Eosinophils Absolute: 0.2 10*3/uL (ref 0.0–0.7)
Eosinophils Relative: 2 %
HEMATOCRIT: 36.4 % — AB (ref 39.0–52.0)
Hemoglobin: 11.8 g/dL — ABNORMAL LOW (ref 13.0–17.0)
Lymphocytes Relative: 13 %
Lymphs Abs: 1.4 10*3/uL (ref 0.7–4.0)
MCH: 33.2 pg (ref 26.0–34.0)
MCHC: 32.4 g/dL (ref 30.0–36.0)
MCV: 102.5 fL — ABNORMAL HIGH (ref 78.0–100.0)
MONO ABS: 1.1 10*3/uL — AB (ref 0.1–1.0)
MONOS PCT: 10 %
NEUTROS ABS: 7.7 10*3/uL (ref 1.7–7.7)
Neutrophils Relative %: 75 %
Platelets: 216 10*3/uL (ref 150–400)
RBC: 3.55 MIL/uL — ABNORMAL LOW (ref 4.22–5.81)
RDW: 16.6 % — AB (ref 11.5–15.5)
WBC: 10.4 10*3/uL (ref 4.0–10.5)

## 2017-03-25 LAB — COMPREHENSIVE METABOLIC PANEL
ALBUMIN: 2.7 g/dL — AB (ref 3.5–5.0)
ALT: 18 U/L (ref 17–63)
ANION GAP: 10 (ref 5–15)
AST: 34 U/L (ref 15–41)
Alkaline Phosphatase: 182 U/L — ABNORMAL HIGH (ref 38–126)
BILIRUBIN TOTAL: 2.3 mg/dL — AB (ref 0.3–1.2)
BUN: 16 mg/dL (ref 6–20)
CO2: 32 mmol/L (ref 22–32)
Calcium: 8.6 mg/dL — ABNORMAL LOW (ref 8.9–10.3)
Chloride: 101 mmol/L (ref 101–111)
Creatinine, Ser: 1.25 mg/dL — ABNORMAL HIGH (ref 0.61–1.24)
GFR, EST NON AFRICAN AMERICAN: 55 mL/min — AB (ref >60)
GLUCOSE: 90 mg/dL (ref 65–99)
POTASSIUM: 2.7 mmol/L — AB (ref 3.5–5.1)
Sodium: 143 mmol/L (ref 135–145)
TOTAL PROTEIN: 7.5 g/dL (ref 6.5–8.1)

## 2017-03-25 LAB — VAS US CAROTID
LCCADSYS: -62 cm/s
LCCAPSYS: 76 cm/s
LEFT ECA DIAS: -3 cm/s
LEFT VERTEBRAL DIAS: 11 cm/s
LICADDIAS: -36 cm/s
LICAPSYS: -73 cm/s
Left CCA dist dias: -22 cm/s
Left CCA prox dias: 15 cm/s
Left ICA dist sys: -85 cm/s
Left ICA prox dias: -26 cm/s
RCCAPDIAS: 23 cm/s
RIGHT ECA DIAS: -4 cm/s
RIGHT VERTEBRAL DIAS: -8 cm/s
Right CCA prox sys: 78 cm/s
Right cca dist sys: -76 cm/s

## 2017-03-25 LAB — BASIC METABOLIC PANEL
ANION GAP: 15 (ref 5–15)
BUN: 15 mg/dL (ref 6–20)
CALCIUM: 8.6 mg/dL — AB (ref 8.9–10.3)
CO2: 27 mmol/L (ref 22–32)
Chloride: 99 mmol/L — ABNORMAL LOW (ref 101–111)
Creatinine, Ser: 1.4 mg/dL — ABNORMAL HIGH (ref 0.61–1.24)
GFR, EST AFRICAN AMERICAN: 56 mL/min — AB (ref >60)
GFR, EST NON AFRICAN AMERICAN: 48 mL/min — AB (ref >60)
Glucose, Bld: 145 mg/dL — ABNORMAL HIGH (ref 65–99)
Potassium: 3.4 mmol/L — ABNORMAL LOW (ref 3.5–5.1)
SODIUM: 141 mmol/L (ref 135–145)

## 2017-03-25 LAB — PROTIME-INR
INR: 2.72
PROTHROMBIN TIME: 29.4 s — AB (ref 11.4–15.2)

## 2017-03-25 LAB — GLUCOSE, CAPILLARY
GLUCOSE-CAPILLARY: 113 mg/dL — AB (ref 65–99)
GLUCOSE-CAPILLARY: 116 mg/dL — AB (ref 65–99)
GLUCOSE-CAPILLARY: 104 mg/dL — AB (ref 65–99)
GLUCOSE-CAPILLARY: 97 mg/dL (ref 65–99)

## 2017-03-25 LAB — AMMONIA: AMMONIA: 40 umol/L — AB (ref 9–35)

## 2017-03-25 MED ORDER — WARFARIN SODIUM 2.5 MG PO TABS
2.5000 mg | ORAL_TABLET | Freq: Once | ORAL | Status: AC
Start: 2017-03-25 — End: 2017-03-25
  Administered 2017-03-25: 2.5 mg via ORAL
  Filled 2017-03-25: qty 1

## 2017-03-25 MED ORDER — VITAMIN B-1 100 MG PO TABS
100.0000 mg | ORAL_TABLET | Freq: Every day | ORAL | Status: DC
  Filled 2017-03-25: qty 1

## 2017-03-25 MED ORDER — POTASSIUM CHLORIDE CRYS ER 20 MEQ PO TBCR: 40.0000 meq | EXTENDED_RELEASE_TABLET | Freq: Three times a day (TID) | ORAL | Status: DC

## 2017-03-25 MED ORDER — POTASSIUM CHLORIDE CRYS ER 20 MEQ PO TBCR
40.0000 meq | EXTENDED_RELEASE_TABLET | Freq: Three times a day (TID) | ORAL | Status: AC
  Administered 2017-03-25 (×3): 40 meq via ORAL
  Filled 2017-03-25 (×3): qty 2

## 2017-03-25 MED ORDER — ELDERTONIC PO ELIX
15.0000 mL | ORAL_SOLUTION | Freq: Every day | ORAL | Status: DC
  Administered 2017-03-25: 15 mL via ORAL
  Filled 2017-03-25 (×2): qty 15

## 2017-03-25 MED ORDER — TORSEMIDE 20 MG PO TABS
20.0000 mg | ORAL_TABLET | Freq: Two times a day (BID) | ORAL | Status: DC
  Administered 2017-03-25 – 2017-03-26 (×3): 20 mg via ORAL
  Filled 2017-03-25 (×3): qty 1

## 2017-03-25 NOTE — Care Management Note (Addendum)
Case Management Note  Patient Details  Name: Melvin Wang MRN: [id number] Date of Birth: [birth year]  Subjective/Objective:        Admitted with AMS/ seizure, hx of Diastolic CHF with end-stage RV failure, Chronic afib, CKD stage III, DM2, resting tremor, and cirrhosis. Incidental pericardial effusion noted on CT ABD/Pelvis in ED. From home with wife.  Action/Plan: Return to home when medically stable. CM to f/u with disposition needs.  Expected Discharge Date:                  Expected Discharge Plan:  [place]  In-House Referral:     Discharge planning Services  CM Consult  Post Acute Care Choice:  Resumption of Svcs/PTA Provider Choice offered to:  Patient  DME Arranged:    DME Agency:     HH Arranged:  PT [place]:  [place]  Status of Service:  In process, will continue to follow  If discussed at Long Length of Stay Meetings, dates discussed:    Additional Comments:  Sharin Mons, RN 03/25/2017, 1:46 PM

## 2017-03-25 NOTE — Progress Notes (Signed)
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 In the ED developed 3 episodes of altered mentation, eyes fluttering, not verbal but followingcommands. Lasting 3 to 4 minutes. On examination hemodynamically stable. Neurology consulted and patient started on Vimpat. MRI with small punctuate subacute white matter infarct in the posterior left pons.Patient has developed worsening encephalopathy with delirium, requiring antipsycotics.    Assessment & Plan:   Principal Problem:   Seizure ([place]) Active Problems:   Essential hypertension   Permanent atrial fibrillation (HCC)   Anemia   Cirrhosis of liver without ascites (HCC)   Type 2 diabetes mellitus with renal complication (HCC)   RVF (right ventricular failure) (HCC)   PAH (pulmonary artery hypertension) (HCC)   Weakness   Confusion   Cerebral thrombosis with cerebral infarction   Acute encephalopathy   Acute on chronic diastolic congestive heart failure ([place])   1. New Metabolic encephalopathy with delirium. Patient with significant improvement of encephalopathy, had episode of continue lactulose therapy.  Improved po intake. Continue haldol at 1 mg as needed IM or PO for agitation and olanzapine at night scheduled. Per patient's family, patient is close to baseline. Will consult physical therapy for discharge planing. Continue folic acid, thiamine and multivitamins.   2. New onset seizures. Continue Vimpat  IV for now. Continue neuro checks. Plan to change to po at discharge.   3. Subacute CVA, punctate subacute white matter infarct in the posterior left pons.No significant carotid stenosis, not likely to be CVA, will continue conservative care. CVA for now ruled out.   4. Systolic heart  failure chronic and stable. Will resume home dose of torsemide to keep negative fluid balance. will hold on IV fluids for now. Patient with improved mentation and po intake.   5. Paroxysmal atrial fibrillation. On amiodarone as tolerated. INR at 2,72. Continue warfarin per pharmacy protocol.  6. 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 Ammonia down to 40 will continue lactulose therapy, to target 3 to 4 bowel movements per day. Will continue aspiration precautions and neuro checks.   7. HTN.  Continue to hold antihypertensive. Systolic blood pressure 500 to 120.   8. CKD stage 3.Renal function with cr at 1,40 with serum bicarbonate 27. Diuresis has been resumed, will follow on renal panel in am.   9. T2DM. Glucose cover and monitoring with sliding scale. Capillary glucose 110, 113, 116.    DVT prophylaxis:on warfarin at home  Code Status:full  Family Communication:I spoke with patient's family at the bedside and all questions were addressed.  Disposition Plan:home    Subjective: Patient with improved mentation, decreased confusion, tolerating po well. No further agitation.   Objective: Vitals:   03/24/17 0520 03/24/17 1557 03/24/17 2127 03/25/17 0553  BP: 118/67 101/60 120/78 122/71  Pulse: (!) 139 (!) 147 73 (!) 120  Resp: 18 18 18 18   Temp: 97.2 F (36.2 C) 98.1 F (36.7 C) 97.5 F (36.4 C) 97.9 F (36.6 C)  TempSrc:  Oral  Oral  SpO2: (!) 89% 98% 94% 98%  Weight:      Height:        Intake/Output Summary (Last 24 hours) at 03/25/17 1257 Last data filed at 03/25/17 0700  Gross per 24 hour  Intake  1107.5 ml  Output                0 ml  Net           1107.5 ml   Filed Weights   03/22/17 0204 03/23/17 0414 03/24/17 0300  Weight: 103.2 kg (227 lb 8.2 oz) 98 kg (216 lb 0.8 oz) 96.2 kg (212 lb 1.3 oz)    Examination:  General exam: deconditioned E ENT. Mild pallor, oral mucosa moist.   Respiratory system: No wheezing, rales or  rhonchi.  Cardiovascular system: S1 & S2 heard, RRR. No JVD, murmurs, rubs, gallops or clicks. No pedal edema. Gastrointestinal system: Abdomen is nondistended, soft and nontender. No organomegaly or masses felt. Normal bowel sounds heard. Central nervous system: Alert and oriented. No focal neurological deficits. Extremities: Symmetric 5 x 5 power. Skin: No rashes, lesions or ulcers     Data Reviewed: I have personally reviewed following labs and imaging studies  CBC:  Recent Labs Lab 03/21/17 0444 03/22/17 0511 03/23/17 0534 03/24/17 0514 03/25/17 0455  WBC 8.1 8.1 8.7 9.9 10.4  NEUTROABS 5.7 5.6 6.6 7.7 7.7  HGB 11.0* 11.2* 11.3* 11.4* 11.8*  HCT 32.1* 33.2* 33.3* 34.6* 36.4*  MCV 99.4 100.0 100.3* 102.1* 102.5*  PLT 174 181 208 218 542   Basic Metabolic Panel:  Recent Labs Lab 03/21/17 0444 03/22/17 0511 03/23/17 0534 03/24/17 0514 03/25/17 0455  NA 132* 136 139 143 143  K 3.7 3.7 3.3* 3.5 2.7*  CL 97* 96* 100* 105 101  CO2 25 29 30 30  32  GLUCOSE 77 96 111* 110* 90  BUN 51* 38* 33* 20 16  CREATININE 2.05* 1.78* 1.67* 1.34* 1.25*  CALCIUM 8.6* 9.1 8.8* 8.8* 8.6*   GFR: Estimated Creatinine Clearance: 64.3 mL/min (A) (by C-G formula based on SCr of 1.25 mg/dL (H)). Liver Function Tests:  Recent Labs Lab 03/20/17 2114 03/21/17 0444 03/24/17 0514 03/25/17 0455  AST 27 22 30  34  ALT 17 16* 16* 18  ALKPHOS 224* 205* 182* 182*  BILITOT 2.3* 2.1* 2.0* 2.3*  PROT 7.5 6.7 7.3 7.5  ALBUMIN 2.9* 2.5* 2.6* 2.7*    Recent Labs Lab 03/20/17 2114  LIPASE 41    Recent Labs Lab 03/20/17 2114 03/22/17 1154 03/23/17 0534 03/24/17 0514 03/25/17 0455  AMMONIA 23 30 26  46* 40*   Coagulation Profile:  Recent Labs Lab 03/21/17 0452 03/22/17 0511 03/23/17 0534 03/24/17 0514 03/25/17 0455  INR 3.72 3.70 3.18 3.57 2.72   Cardiac Enzymes:  Recent Labs Lab 03/21/17 0444 03/21/17 1036 03/21/17 1647  TROPONINI 0.03* <0.03 0.03*   BNP (last 3  results) No results for input(s): PROBNP in the last 8760 hours. HbA1C: No results for input(s): HGBA1C in the last 72 hours. CBG:  Recent Labs Lab 03/24/17 1222 03/24/17 1740 03/24/17 2304 03/25/17 0758 03/25/17 1244  GLUCAP 110* 148* 110* 113* 116*   Lipid Profile: No results for input(s): CHOL, HDL, LDLCALC, TRIG, CHOLHDL, LDLDIRECT in the last 72 hours. Thyroid Function Tests: No results for input(s): TSH, T4TOTAL, FREET4, T3FREE, THYROIDAB in the last 72 hours. Anemia Panel: No results for input(s): VITAMINB12, FOLATE, FERRITIN, TIBC, IRON, RETICCTPCT in the last 72 hours. Sepsis Labs:  Recent Labs Lab 03/20/17 2354 03/21/17 0444 03/21/17 1036  LATICACIDVEN 1.45 1.1 1.4    No results found for this or any previous visit (from the past 240 hour(s)).       Radiology Studies: No results found.      Scheduled Meds: .  allopurinol  300 mg Oral Daily  . amiodarone  200 mg Oral BID  . calcitRIOL  0.5 mcg Oral Daily  . colchicine  0.6 mg Oral BID  . febuxostat  40 mg Oral Daily  . geriatric multivitamins-minerals  15 mL Oral Daily  . insulin aspart  0-9 Units Subcutaneous TID WC  . lactulose  30 g Oral TID  . OLANZapine  5 mg Intramuscular QHS  . pantoprazole  40 mg Oral BID AC  . potassium chloride  40 mEq Oral TID  . thiamine injection  100 mg Intravenous Daily  . torsemide  20 mg Oral BID  . warfarin  2.5 mg Oral ONCE-1800  . Warfarin - Pharmacist Dosing Inpatient   Does not apply q1800   Continuous Infusions: . dextrose 5 % and 0.9% NaCl 50 mL/hr at 03/23/17 1216  . lacosamide (VIMPAT) IV       LOS: 4 days       Tuff Clabo Gerome Apley, MD [facility] Pager [phone]  If 7PM-7AM, please contact night-coverage [email or web address] Password [facility] 03/25/2017, 12:57 PM

## 2017-03-25 NOTE — Progress Notes (Signed)
ANTICOAGULATION CONSULT NOTE -Follow up  Pharmacy Consult for Coumadin  Indication: atrial fibrillation   Patient Measurements: Height: 6\' 1"  (185.4 cm) Weight: 212 lb 1.3 oz (96.2 kg) IBW/kg (Calculated) : 79.9  Vital Signs: Temp: 97.9 F (36.6 C) (04/17 0553) Temp Source: Oral (04/17 0553) BP: 122/71 (04/17 0553) Pulse Rate: 120 (04/17 0553)  Labs:  Recent Labs  03/23/17 0534 03/24/17 0514 03/25/17 0455  HGB 11.3* 11.4* 11.8*  HCT 33.3* 34.6* 36.4*  PLT 208 218 216  LABPROT 33.3* 36.5* 29.4*  INR 3.18 3.57 2.72  CREATININE 1.67* 1.34* 1.25*    Estimated Creatinine Clearance: 64.3 mL/min (A) (by C-G formula based on SCr of 1.25 mg/dL (H)).  Assessment: 76 y/o M on Coumadin PTA for history of afib, presented to [facility] on 03/20/17  with altered mental status.  PTA Coumadin dose: 2.5mg  daily, last taken pta on 4/11- INR 4.3 at outpatient anti-coag visit on 4/12.  Coumadin has been held here for elevated INRs, INR has trended down to 2.72 today.  Hgb 11.8, plts 216- no bleeding noted.  Goal of Therapy:  INR 2-3 Monitor platelets by anticoagulation protocol: Yes   Plan:  Coumadin 2.5mg  PO x 1 tonight Daily INR and CBC  Alianna Wurster, Pharm.D., BCPS Clinical Pharmacist Pager [phone] 03/25/2017 10:54 AM

## 2017-03-25 NOTE — Care Management Important Message (Signed)
Important Message  Patient Details  Name: Melvin Wang MRN: [id number] Date of Birth: 08/21/1941   Medicare Important Message Given:  Yes    Nathen May 03/25/2017, 11:43 AM

## 2017-03-25 NOTE — Progress Notes (Signed)
CRITICAL VALUE ALERT  Critical value received:  Potassium 2.7  Date of notification:  03/25/17  Time of notification:  0701  Critical value read back:Yes.    Nurse who received alert:  Will Bonnet, RN  MD notified (1st page):  Arrien,MD  Time of first page:  0705  Responding MD:  Arrien,MD  Time MD responded:  78 Order for PO potassium placed. Dayshift RN Ginger aware.

## 2017-03-25 NOTE — Progress Notes (Signed)
[facility]  Patient Status: Active (receiving services up to time of hospitalization)  [facility] is providing the following services: PT  If patient discharges after hours, please call [phone].   Melvin Wang 03/25/2017, 11:27 AM

## 2017-03-25 NOTE — Progress Notes (Signed)
Advanced Heart Failure Rounding Note  PCP: Claretta Fraise, MD Primary Cardiologist: Dr. Haroldine Laws   Subjective:    Much more alert.  Alert to person, place and time. Denies SOB. Taking his medications. Feels weak. Wants to go home.   No output recorded and no weight this am.   Creatinine 1.23 from 1.3 yesterday.    MRA 03/22/17 with no intracranial significance. 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   MRI 03/21/17 with ? Punctate subacute white matter infarct in the posterior left pons. + mild anterior sinus disease.   EEG 03/21/17, completed. No interpretation available in system.  Echo 03/21/17 with EF 50-55%, RV volume overloaded, + moderate, partially loculated pericardial effusion without tamponade.   Objective:   Weight Range: 212 lb 1.3 oz (96.2 kg) Body mass index is 27.98 kg/m.   Vital Signs:   Temp:  [97.5 F (36.4 C)-98.1 F (36.7 C)] 97.9 F (36.6 C) (04/17 0553) Pulse Rate:  [73-147] 120 (04/17 0553) Resp:  [18] 18 (04/17 0553) BP: (101-122)/(60-78) 122/71 (04/17 0553) SpO2:  [94 %-98 %] 98 % (04/17 0553) Last BM Date: 03/24/17  Weight change: Filed Weights   03/22/17 0204 03/23/17 0414 03/24/17 0300  Weight: 227 lb 8.2 oz (103.2 kg) 216 lb 0.8 oz (98 kg) 212 lb 1.3 oz (96.2 kg)    Intake/Output:   Intake/Output Summary (Last 24 hours) at 03/25/17 0754 Last data filed at 03/25/17 0700  Gross per 24 hour  Intake           1107.5 ml  Output                0 ml  Net           1107.5 ml     Physical Exam: General:  Caucasian, elderly, fatigued appearing male in NAD.   HEENT: Normal. Neck: Supple. JVP 9-10 cm sitting up in bed Carotids 2+ bilat; no bruits. No thyromegaly or nodule noted.  Cor: PMI non-displaced. Irr Irr and somewhat tachy. + 2/6 TR and + RV lift. Friction rub much less audible, though may be related to rate.  Lungs: Decreased basilar sound.  Abdomen: Soft, NT, mildly  distended, no HSM. No bruits or masses. +BS  Extremities: No cyanosis, clubbing, rash. Trace ankle edema. + resting tremor with ? Mild component of asterixis.   Neuro: Alert & oriented x 3. Cranial nerves grossly intact. Moves all 4 extremities w/o difficulty. Affect pleasant    Telemetry: Not currently connected.    Labs: CBC  Recent Labs  03/24/17 0514 03/25/17 0455  WBC 9.9 10.4  NEUTROABS 7.7 7.7  HGB 11.4* 11.8*  HCT 34.6* 36.4*  MCV 102.1* 102.5*  PLT 218 767   Basic Metabolic Panel  Recent Labs  03/24/17 0514 03/25/17 0455  NA 143 143  K 3.5 2.7*  CL 105 101  CO2 30 32  GLUCOSE 110* 90  BUN 20 16  CREATININE 1.34* 1.25*  CALCIUM 8.8* 8.6*   Liver Function Tests  Recent Labs  03/24/17 0514 03/25/17 0455  AST 30 34  ALT 16* 18  ALKPHOS 182* 182*  BILITOT 2.0* 2.3*  PROT 7.3 7.5  ALBUMIN 2.6* 2.7*   No results for input(s): LIPASE, AMYLASE in the last 72 hours. Cardiac Enzymes No results for input(s): CKTOTAL, CKMB, CKMBINDEX, TROPONINI in the last 72 hours.  BNP: BNP (last 3 results) No results for input(s): BNP in the  last 8760 hours.  ProBNP (last 3 results) No results for input(s): PROBNP in the last 8760 hours.   D-Dimer No results for input(s): DDIMER in the last 72 hours. Hemoglobin A1C No results for input(s): HGBA1C in the last 72 hours. Fasting Lipid Panel No results for input(s): CHOL, HDL, LDLCALC, TRIG, CHOLHDL, LDLDIRECT in the last 72 hours. Thyroid Function Tests No results for input(s): TSH, T4TOTAL, T3FREE, THYROIDAB in the last 72 hours.  Invalid input(s): FREET3  Other results:     Imaging/Studies:  No results found.    Medications:     Scheduled Medications: . allopurinol  300 mg Oral Daily  . amiodarone  200 mg Oral BID  . calcitRIOL  0.5 mcg Oral Daily  . colchicine  0.6 mg Oral BID  . febuxostat  40 mg Oral Daily  . furosemide  60 mg Intravenous BID  . insulin aspart  0-9 Units Subcutaneous TID WC    . lacosamide (VIMPAT) IV  100 mg Intravenous Q12H  . lactulose  30 g Oral TID  . multivitamin (MVI) IV infusion (LR or D5LR)   Intravenous Q24H  . OLANZapine  5 mg Intramuscular QHS  . pantoprazole  40 mg Oral BID AC  . potassium chloride  40 mEq Oral TID  . thiamine injection  100 mg Intravenous Daily  . Warfarin - Pharmacist Dosing Inpatient   Does not apply q1800    Infusions: . dextrose 5 % and 0.9% NaCl 50 mL/hr at 03/23/17 1216    PRN Medications: acetaminophen **OR** acetaminophen, haloperidol **OR** haloperidol lactate   Assessment/Plan   Melvin Wang is a 76 y.o. male with PMH of Diastolic CHF with end-stage RV failure, Chronic afib, CKD stage III, DM2, resting tremor, and cirrhosis who presented to [facility] after episode of confusion. Incidental pericardial effusion noted on CT ABD/Pelvis  1. AMS with seizure like activity - Now with worsening encephalopathy. Refusing po meds. Has haldol ordered prn and now on Zypexa.  - MRI 03/21/17 with ? Punctate subacute white matter infarct in the posterior pons. No other acute abnormality.  - Neuro following. EEG completed. Report pending.    - LFTs WNL this am.  - Baseline cognitive impairment also noted.  2. Acute pericarditis with effusion - Unclear cause. Sinus disease noted on MRI head. ? Viral cause with reported URI.  - Effusion incidentally on CT ABD/pel.  At its widest point is 18 mm.Echo without tamponade.  - Continue colchicine. No change today.  3. 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 Will stop IV lasix and transition back to torsemide now that he is taking po.  - Careful not to over-diurese with RV failure and pre-load dependence. 4. Chronic Afib - Continue amiodarone 200 mg BID for rate control. Watch closely with liver failure.   - Rate up in acute setting.  - INR trending down slowly with liver failure.  (supra therapeutic on admit) - Continue coumadin.  5. CKD stage III -  Improved from admission, likely due to volume overload.  6. Cirrhosis with hepatic encephalopathy  - Ammonia 40 this am with compliance with his lactulose.  Pt remains very ill and in imminent danger of organ failure.  Prognosis guarded at this point. Stressed importance of medication compliance.  Pt will be at very high risk for re-admission.   Length of Stay: [address]  Annamaria Helling  03/25/2017, 7:54 AM  Advanced Heart Failure Team Pager [phone] (M-F; 7a - 4p)  Please contact [facility] for night-coverage after hours (4p -7a ) and weekends on [email or web address]  Patient seen and examined with the above-signed Advanced Practice Provider and/or Housestaff. I personally reviewed laboratory data, imaging studies and relevant notes. I independently examined the patient and formulated the important aspects of the plan. I have edited the note to reflect any of my changes or salient points. I have personally discussed the plan with the patient and/or family.  Volume status improved with IV lasix. Can switch back to po. Mental status clearing. Meds reviewed with pharmD. Will need repeat echo to re-evaluate pericardial effusion. Continue colchicine.   Glori Bickers, MD  11:19 AM

## 2017-03-26 DIAGNOSIS — I482 Chronic atrial fibrillation: Secondary | ICD-10-CM

## 2017-03-26 LAB — PROTIME-INR
INR: 2.33
PROTHROMBIN TIME: 26 s — AB (ref 11.4–15.2)

## 2017-03-26 LAB — CBC WITH DIFFERENTIAL/PLATELET
BASOS ABS: 0.1 10*3/uL (ref 0.0–0.1)
BASOS PCT: 0 %
EOS ABS: 0.2 10*3/uL (ref 0.0–0.7)
EOS PCT: 2 %
HCT: 36.9 % — ABNORMAL LOW (ref 39.0–52.0)
HEMOGLOBIN: 12 g/dL — AB (ref 13.0–17.0)
Lymphocytes Relative: 14 %
Lymphs Abs: 1.6 10*3/uL (ref 0.7–4.0)
MCH: 33.5 pg (ref 26.0–34.0)
MCHC: 32.5 g/dL (ref 30.0–36.0)
MCV: 103.1 fL — ABNORMAL HIGH (ref 78.0–100.0)
Monocytes Absolute: 1.1 10*3/uL — ABNORMAL HIGH (ref 0.1–1.0)
Monocytes Relative: 9 %
NEUTROS PCT: 75 %
Neutro Abs: 8.6 10*3/uL — ABNORMAL HIGH (ref 1.7–7.7)
PLATELETS: 194 10*3/uL (ref 150–400)
RBC: 3.58 MIL/uL — AB (ref 4.22–5.81)
RDW: 16.6 % — ABNORMAL HIGH (ref 11.5–15.5)
WBC: 11.4 10*3/uL — AB (ref 4.0–10.5)

## 2017-03-26 LAB — COMPREHENSIVE METABOLIC PANEL
ALT: 17 U/L (ref 17–63)
AST: 29 U/L (ref 15–41)
Albumin: 2.6 g/dL — ABNORMAL LOW (ref 3.5–5.0)
Alkaline Phosphatase: 204 U/L — ABNORMAL HIGH (ref 38–126)
Anion gap: 8 (ref 5–15)
BILIRUBIN TOTAL: 2.3 mg/dL — AB (ref 0.3–1.2)
BUN: 16 mg/dL (ref 6–20)
CALCIUM: 8.5 mg/dL — AB (ref 8.9–10.3)
CO2: 32 mmol/L (ref 22–32)
Chloride: 103 mmol/L (ref 101–111)
Creatinine, Ser: 1.41 mg/dL — ABNORMAL HIGH (ref 0.61–1.24)
GFR calc Af Amer: 56 mL/min — ABNORMAL LOW (ref >60)
GFR, EST NON AFRICAN AMERICAN: 48 mL/min — AB (ref >60)
Glucose, Bld: 99 mg/dL (ref 65–99)
POTASSIUM: 4 mmol/L (ref 3.5–5.1)
Sodium: 143 mmol/L (ref 135–145)
TOTAL PROTEIN: 7.6 g/dL (ref 6.5–8.1)

## 2017-03-26 LAB — AMMONIA: AMMONIA: 16 umol/L (ref 9–35)

## 2017-03-26 LAB — GLUCOSE, CAPILLARY
GLUCOSE-CAPILLARY: 85 mg/dL (ref 65–99)
Glucose-Capillary: 96 mg/dL (ref 65–99)

## 2017-03-26 MED ORDER — COLCHICINE 0.6 MG PO TABS: 0.6000 mg | ORAL_TABLET | Freq: Every day | ORAL | 0 refills | Status: AC

## 2017-03-26 MED ORDER — WARFARIN SODIUM 2.5 MG PO TABS: 2.5000 mg | ORAL_TABLET | Freq: Every day | ORAL | Status: AC

## 2017-03-26 MED ORDER — LACTULOSE 10 GM/15ML PO SOLN
20.0000 g | Freq: Three times a day (TID) | ORAL | Status: DC
  Filled 2017-03-26: qty 30

## 2017-03-26 MED ORDER — WARFARIN SODIUM 2.5 MG PO TABS: 2.5000 mg | ORAL_TABLET | Freq: Once | ORAL | Status: DC

## 2017-03-26 NOTE — Progress Notes (Signed)
Apical Pulse 180 Rosalio Loud, RN notified. Lorn Junes, student nurse

## 2017-03-26 NOTE — Progress Notes (Signed)
Nsg Discharge Note  Admit Date:  03/20/2017 Discharge date: 03/26/2017   Rick Duff K[place] to be D/C'd Home per MD order.  AVS completed.  Copy for chart, and copy for patient signed, and dated. Patient/caregiver able to verbalize understanding.  Discharge Medication: Allergies as of 03/26/2017      Reactions   Acyclovir And Related Hives   Metformin And Related Diarrhea   Amoxicillin Hives, Other (See Comments)   Has patient had a PCN reaction causing immediate rash, facial/tongue/throat swelling, SOB or lightheadedness with hypotension: No Has patient had a PCN reaction causing severe rash involving mucus membranes or skin necrosis: No Has patient had a PCN reaction that required hospitalization No Has patient had a PCN reaction occurring within the last 10 years: No If all of the above answers are "NO", then may proceed with Cephalosporin use.   Augmentin [amoxicillin-pot Clavulanate] Hives, Other (See Comments)   Has patient had a PCN reaction causing immediate rash, facial/tongue/throat swelling, SOB or lightheadedness with hypotension: No Has patient had a PCN reaction causing severe rash involving mucus membranes or skin necrosis: No Has patient had a PCN reaction that required hospitalization No Has patient had a PCN reaction occurring within the last 10 years: No If all of the above answers are "NO", then may proceed with Cephalosporin use.   Mavik [trandolapril] Other (See Comments)   Reaction:  Caused renal problems      Medication List    STOP taking these medications   Insulin Glargine 100 UNIT/ML Solostar Pen Commonly known as:  LANTUS SOLOSTAR     TAKE these medications   allopurinol 300 MG tablet Commonly known as:  ZYLOPRIM Take 300 mg by mouth daily.   amiodarone 200 MG tablet Commonly known as:  PACERONE Take 1 tablet (200 mg total) by mouth 2 (two) times daily.   B-D UF III MINI PEN NEEDLES 31G X 5 MM Misc Generic drug:  Insulin Pen Needle USE 1 TIME  DAILY WITH LANTUS SOLOSTAR   calcitRIOL 0.5 MCG capsule Commonly known as:  ROCALTROL Take 0.5 mcg by mouth daily.   colchicine 0.6 MG tablet Take 1 tablet (0.6 mg total) by mouth daily.   febuxostat 40 MG tablet Commonly known as:  ULORIC Take 1 tablet (40 mg total) by mouth daily.   Lactulose 20 GM/30ML Soln Take 30 mLs (20 g total) by mouth 2 (two) times daily.   pantoprazole 40 MG tablet Commonly known as:  PROTONIX TAKE 1 TABLET (40 MG TOTAL) BY MOUTH 2 (TWO) TIMES DAILY BEFORE A MEAL.   propranolol 20 MG tablet Commonly known as:  INDERAL Take 1 tablet (20 mg total) by mouth 2 (two) times daily.   torsemide 20 MG tablet Commonly known as:  DEMADEX Take 1 tablet (20 mg total) by mouth 2 (two) times daily.   warfarin 2.5 MG tablet Commonly known as:  COUMADIN Take 1 tablet (2.5 mg total) by mouth daily.       Discharge Assessment: Vitals:   03/26/17 1330 03/26/17 1350  BP: 100/70 99/72  Pulse: (!) 180 (!) 114  Resp: (!) 22 18  Temp: 97 F (36.1 C) 97.8 F (36.6 C)   Skin clean, dry and intact without evidence of skin break down, no evidence of skin tears noted. IV catheter discontinued intact. Site without signs and symptoms of complications - no redness or edema noted at insertion site, patient denies c/o pain - only slight tenderness at site.  Dressing with slight pressure applied.  D/c Instructions-Education: Discharge instructions given to patient/family with verbalized understanding. D/c education completed with patient/family including follow up instructions, medication list, d/c activities limitations if indicated, with other d/c instructions as indicated by MD - patient able to verbalize understanding, all questions fully answered. Patient instructed to return to ED, call 911, or call MD for any changes in condition.  Patient discharged home with [facility].  Transported via Gwenyth Ober, RN 03/26/2017 5:03 PM

## 2017-03-26 NOTE — Care Management Note (Signed)
Case Management Note  Patient Details  Name: Melvin Wang MRN: [id number] Date of Birth: 11-19-1941  Subjective/Objective:  AMS, CHF end-stage                Action/Plan: Discharge Planning: NCM spoke to pt and dtr, Melvin Wang at bedside. Offered choice for Home Hospice. Family has been in contact with [facility]. NCM spoke to [facility] [place]. Requested NCM fax order and facesheet to hospice. They will arrange delivery of hospital bed. overbed table, and oxygen to the home. Will fax dc summary once available. Will arrange [facility] transport.   PCP Claretta Fraise MD  Expected Discharge Date:  03/26/2017              Expected Discharge Plan:  [place]  In-House Referral:  NA  Discharge planning Services  CM Consult  Post Acute Care Choice:  Resumption of Svcs/PTA Provider, Hospice Choice offered to:  Adult Children, Spouse  DME Arranged:  Oxygen, Hospital bed DME Agency:  [facility]  HH Arranged:  RN [facility] Agency:  [place]  Status of Service:  Completed, signed off  If discussed at [place] of Stay Meetings, dates discussed:    Additional Comments:  Erenest Rasher, RN 03/26/2017, 3:41 PM

## 2017-03-26 NOTE — Discharge Summary (Signed)
Physician Discharge Summary  Melvin Wang IRS:[id number] DOB: 15-Nov-1941  PCP: Claretta Fraise, MD  Admit date: 03/20/2017 Discharge date: 03/26/2017  Recommendations for Outpatient Follow-up:  1. Dr. Claretta Fraise, PCP in 2 days with repeat labs (CBC, CMP, PT, INR & ammonia). 2. Dr. Glori Bickers, Cardiology: MDs office will arrange outpatient follow-up in 2 weeks. 3. Patient will be followed at home by [facility]: None Equipment/Devices: None    Discharge Condition: Guarded and at risk for decline and death.  CODE STATUS: DO NOT RESUSCITATE  Diet recommendation: Heart healthy and diabetic diet.  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 As per family's report, patient was having periods of confusion where patient talked something unrelated. In the ED, ammonia levels were normal but patient had 3 episodes where his eyes were fluttering and each episode lasted around 3-4 minutes during which time patient followed commands but did not talk. He did not have any tongue bite, postictal phase or incontinence of urine.  Neurologist on call was consulted and during the consult agent had another episode. He was empirically started on Vimpat for possible seizures. CT head was unremarkable. He reported abdominal discomfort for which CT abdomen was done that showed new pericardial effusion. He was hypotensive and tachycardic, fluid bolus was given and due to new pericardial effusion, cardiology was also consulted.  Assessment and plan:  1. Acute hepatic encephalopathy: Neurology was consulted and patient underwent extensive workup including for stroke (please refer to neurology progress note from 03/24/17). Neurology signed off on 03/24/17. I discussed with Dr. Leonie Man, Neurology who indicated that patient likely had encephalopathy rather than a stroke and he even did not think that patient had seizure and all his presentation was from encephalopathy. He recommended discontinuing Vimpat at discharge. EEG was still pending at time of discharge. Encephalopathy improved and so did ammonia but now having increased BMs. Did use lactulose to prior home dose at discharge. Patient also has baseline cognitive impairment. 2. Acute pericarditis with effusion: Cardiology consultation and follow-up appreciated. Unclear cause.? Viral cause with reported URI. Effusion incidentally seen on CT abdomen/pelvis. At its widest point is 18 mm. Echo without tamponade. Discussed with Dr.Bensihom recommended discharging on colchicine 0.6 mg daily. Can be followed up with cardiology as outpatient with repeat echo in a couple of weeks. 3. 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 Dr. Haroldine Laws discussed with me after meeting and discussing with family and advised that patient/family were ready to go home with hospice today and family themselves had contacted the hospice agency. IV Lasix was changed to home dose of torsemide. Cardiology will arrange outpatient follow-up. 4. Chronic A. fib:  Continue amiodarone but would  not increase due to liver failure. Discussed with Dr. Haroldine Laws recommended resuming home dose of propranolol at discharge and defer Coumadin anticoagulation to family's decision. I discussed with spouse who wished to continue Coumadin and will follow-up with PCPs office for Coumadin management. 5. Stage III chronic kidney disease: Creatinine stable in the 1.4 range for the last 2 days but will need close monitoring as outpatient. 6. Hypokalemia: Replaced 7. Cirrhosis with hepatic encephalopathy: Ammonia down to 16. Multiple BMs last night and hence will reduce lactulose to prior home dose at discharge. This can be titrated as outpatient as needed. 8. Type II DM: CBGs well-controlled despite not taking his insulins at home. As discussed with spouse, plan to discharge home without insulin's and continued CBG monitoring. Hemoglobin A1c 5.4. 9. Essential hypertension: Controlled. 10. Hyperlipidemia: LDL 61. Not on meds at home. 11. Anemia: Stable. 12. Adult failure to thrive: As stated above, cardiology discussed with patient's wife and daughter and they were adamant to take him home today with [facility]. As per cardiology, given end-stage right heart failure, cirrhosis and delirium, felt appropriate to DC home with hospice and patient also agreed. Patient is DO NOT RESUSCITATE. I confirmed this treatment plan with his spouse.   Consultations:  Neurology  Cardiology  Procedures:  EEG   Discharge Instructions  Discharge Instructions    (HEART FAILURE PATIENTS) Call MD:  Anytime you have any of the following symptoms: 1) 3 pound weight gain in 24 hours or 5 pounds in 1 week 2) shortness of breath, with or without a dry hacking cough 3) swelling in the hands, feet or stomach 4) if you have to sleep on extra pillows at night in order to breathe.    Complete by:  As directed    Call MD for:    Complete by:  As directed    Worsening confusion.   Call MD for:  difficulty  breathing, headache or visual disturbances    Complete by:  As directed    Call MD for:  extreme fatigue    Complete by:  As directed    Call MD for:  persistant dizziness or light-headedness    Complete by:  As directed    Call MD for:  persistant nausea and vomiting    Complete by:  As directed    Call MD for:  severe uncontrolled pain    Complete by:  As directed    Call MD for:  temperature >100.4    Complete by:  As directed    Diet - low sodium heart healthy    Complete by:  As directed    Diet Carb Modified    Complete by:  As directed    Increase activity slowly    Complete by:  As directed        Medication List    STOP taking these medications   Insulin Glargine 100 UNIT/ML Solostar Pen Commonly known as:  LANTUS SOLOSTAR     TAKE these medications   allopurinol 300 MG tablet Commonly known as:  ZYLOPRIM Take 300 mg by mouth daily.   amiodarone 200 MG tablet Commonly known as:  PACERONE Take 1 tablet (200 mg total) by mouth 2 (two) times daily.   B-D UF III MINI PEN NEEDLES 31G X 5 MM Misc Generic drug:  Insulin Pen Needle USE 1 TIME DAILY WITH LANTUS SOLOSTAR   calcitRIOL 0.5 MCG capsule Commonly known as:  ROCALTROL Take 0.5 mcg  by mouth daily.   colchicine 0.6 MG tablet Take 1 tablet (0.6 mg total) by mouth daily.   febuxostat 40 MG tablet Commonly known as:  ULORIC Take 1 tablet (40 mg total) by mouth daily.   Lactulose 20 GM/30ML Soln Take 30 mLs (20 g total) by mouth 2 (two) times daily.   pantoprazole 40 MG tablet Commonly known as:  PROTONIX TAKE 1 TABLET (40 MG TOTAL) BY MOUTH 2 (TWO) TIMES DAILY BEFORE A MEAL.   propranolol 20 MG tablet Commonly known as:  INDERAL Take 1 tablet (20 mg total) by mouth 2 (two) times daily.   torsemide 20 MG tablet Commonly known as:  DEMADEX Take 1 tablet (20 mg total) by mouth 2 (two) times daily.   warfarin 2.5 MG tablet Commonly known as:  COUMADIN Take 1 tablet (2.5 mg total) by mouth daily.       Follow-up Information    [facility] Follow up.   Why:  Home Hospice RN Contact information: [address] [place] [address] [phone]        Claretta Fraise, MD. Schedule an appointment as soon as possible for a visit in 2 day(s).   Specialty:  Family Medicine Why:  To be seen with repeat labs (CBC, CMP, PT, INR & ammonia). Contact information: [place] [address] [phone]        Glori Bickers, MD Follow up.   Specialty:  Cardiology Why:  MDs office will arrange follow-up in 2 weeks. Contact information: [place] [address] [phone]          Allergies  Allergen Reactions  . Acyclovir And Related Hives  . Metformin And Related Diarrhea  . Amoxicillin Hives and Other (See Comments)    Has patient had a PCN reaction causing immediate rash, facial/tongue/throat swelling, SOB or lightheadedness with hypotension: No Has patient had a PCN reaction causing severe rash involving mucus membranes or skin necrosis: No Has patient had a PCN reaction that required hospitalization No Has patient had a PCN reaction occurring within the last 10 years: No If all of the above answers are "NO", then may proceed with Cephalosporin use.  . Augmentin [Amoxicillin-Pot Clavulanate] Hives and Other (See Comments)    Has patient had a PCN reaction causing immediate rash, facial/tongue/throat swelling, SOB or lightheadedness with hypotension: No Has patient had a PCN reaction causing severe rash involving mucus membranes or skin necrosis: No Has patient had a PCN reaction that required hospitalization No Has patient had a PCN reaction occurring within the last 10 years: No If all of the above answers are "NO", then may proceed with Cephalosporin use.  [name] [Trandolapril] Other (See Comments)    Reaction:  Caused renal problems      Procedures/Studies: Ct Abdomen Pelvis Wo Contrast  Result Date:  03/21/2017 CLINICAL DATA:  Abdominal pain and history of cirrhosis EXAM: CT ABDOMEN AND PELVIS WITHOUT CONTRAST TECHNIQUE: Multidetector CT imaging of the abdomen and pelvis was performed following the standard protocol without IV contrast. COMPARISON:  Abdominal ultrasound 01/20/2017 CT abdomen pelvis 01/20/2017 FINDINGS: Lower chest: There is bilateral gynecomastia. There are bilateral small pleural effusions and associated atelectasis. There is marked cardiomegaly with a pericardial effusion measuring up to 1.8 cm in thickness, new compared to the prior study. Hepatobiliary: There is a nodular hepatic contour with relative hypertrophy of the caudate and left hepatic lobe. There is a small amount of perihepatic ascites. The gallbladder contents  are hyperdense. No biliary dilatation. Pancreas: Normal pancreatic contours. No peripancreatic fluid collection or pancreatic ductal dilatation. Spleen: Normal. Adrenals/Urinary Tract: Normal adrenal glands. No hydronephrosis or nephrolithiasis. No abnormal perinephric stranding. No ureteral obstruction. Stomach/Bowel: No abnormal bowel dilatation. No bowel wall thickening or adjacent fat stranding to indicate acute inflammation. No abdominal fluid collection. The appendix is not clearly seen. Vascular/Lymphatic: There is atherosclerotic calcification of the non aneurysmal abdominal aorta. No abdominal or pelvic adenopathy. Reproductive: Normal prostate and seminal vesicles. Musculoskeletal: No lytic or blastic osseous lesion. No bony spinal canal stenosis. Multilevel thoracolumbar facet arthrosis and osteophytosis. Other: Small amount of perisplenic ascites. IMPRESSION: 1. Findings of hepatic cirrhosis with small amount of perihepatic ascites. 2. Pericardial effusion measuring up to 18 mm, new from the study of 01/20/2017. 3. Bilateral small pleural effusions with associated atelectasis. 4. Aortic atherosclerosis and cardiomegaly. Electronically Signed   By: Ulyses Jarred  M.D.   On: 03/21/2017 03:54   Dg Chest 2 View  Result Date: 03/20/2017 CLINICAL DATA:  Weakness.  Nausea and vomiting. EXAM: CHEST  2 VIEW COMPARISON:  Chest radiograph 03/05/2017 FINDINGS: Unchanged cardiomegaly. Unchanged left basilar atelectasis. No focal consolidation otherwise. No pneumothorax or pleural effusion. No pulmonary edema. IMPRESSION: Unchanged appearance of left basilar atelectasis. Electronically Signed   By: Ulyses Jarred M.D.   On: 03/20/2017 22:33   Dg Chest 2 View  Result Date: 03/05/2017 CLINICAL DATA:  Shortness of breath. EXAM: CHEST  2 VIEW COMPARISON:  03/01/2017. FINDINGS: Stable cardiomegaly with normal pulmonary vascularity. Stable left base pleuroparenchymal thickening consistent with scarring. Bilateral pleural effusions. No pneumothorax. No acute bony abnormality. IMPRESSION: 1. Stable left base atelectatic changes and/or pleural-parenchymal scarring. 2.  Stable cardiomegaly.  Bilateral pleural effusions. Electronically Signed   By: Marcello Moores  Register   On: 03/05/2017 13:12   Ct Head Wo Contrast  Result Date: 03/21/2017 CLINICAL DATA:  Confusion EXAM: CT HEAD WITHOUT CONTRAST TECHNIQUE: Contiguous axial images were obtained from the base of the skull through the vertex without intravenous contrast. COMPARISON:  None. FINDINGS: Brain: No mass lesion, intraparenchymal hemorrhage or extra-axial collection. No evidence of acute cortical infarct. There is periventricular hypoattenuation compatible with chronic microvascular disease. There is cerebral volume loss without lobar predilection. Vascular: No hyperdense vessel or unexpected calcification. Skull: Normal visualized skull base, calvarium and extracranial soft tissues. Sinuses/Orbits: No sinus fluid levels or advanced mucosal thickening. No mastoid effusion. Normal orbits. IMPRESSION: 1. No acute intracranial abnormality. 2. Chronic microvascular ischemia and cerebral volume loss. Electronically Signed   By: Ulyses Jarred  M.D.   On: 03/21/2017 03:32   Mr Virgel Paling MH Contrast  Result Date: 03/22/2017 CLINICAL DATA:  76 year old male with questionable left dorsal pons lacunar infarct on brain MRI yesterday for abnormal speech and confusion. EXAM: MRA HEAD WITHOUT CONTRAST TECHNIQUE: Angiographic images of the Circle of Willis were obtained using MRA technique without intravenous contrast. COMPARISON:  Brain MRI 03/21/2017. FINDINGS: No intracranial mass effect or ventriculomegaly. Antegrade flow in the posterior circulation. Mildly dominant distal left vertebral artery. Tortuous vertebrobasilar junction with no distal vertebral or basilar irregularity or stenosis. Left PICA and dominant appearing AICA origins are patent. Normal SCA and right PCA origins. Fetal type left PCA origin. Bilateral PCA branches are within normal limits. Right posterior communicating artery is diminutive or absent. Antegrade flow in both ICA siphons. 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 No distal cervical ICA stenosis identified. Tortuous cavernous segments. No ICA siphon  stenosis. Ophthalmic and left posterior communicating artery origins are normal. Patent carotid termini. Normal MCA and ACA origins. Anterior communicating artery and visible ACA branches are within normal limits. Early left MCA bifurcation. Left MCA M1 segment and visible left MCA branches are within normal limits. Right MCA M1 segment, right MCA bifurcation, and visible right MCA branches are within normal limits. IMPRESSION: 1.  Negative intracranial MRA. 2. 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 No associated distal right ICA stenosis. Electronically Signed   By: Genevie Ann M.D.   On: 03/22/2017 14:29   Mr Brain Wo Contrast  Result Date: 03/21/2017 CLINICAL DATA:  Spells of speech arrest.  Periods of confusion. EXAM: MRI HEAD WITHOUT CONTRAST TECHNIQUE: Multiplanar,  multiecho pulse sequences of the brain and surrounding structures were obtained without intravenous contrast. COMPARISON:  CT head without contrast 03/21/2017. FINDINGS: Brain: A punctate area diffusion abnormality is present within the posterior left pons. The coronal diffusion images skin over this area. No significant cortical infarct is present. Mild atrophy is otherwise within normal limits for age. There is no significant white matter disease. The brainstem and cerebellum are otherwise within normal limits. T2 changes are associated with the area noted on the diffusion series. Internal auditory canals are within normal limits. Vascular: Flow is present in the major intracranial arteries. Skull and upper cervical spine: The skullbase is within normal limits. Midline sagittal structures R within normal limits. The upper cervical spine is unremarkable. The craniocervical junction is normal. Sinuses/Orbits: Mild mucosal thickening is present within the maxillary sinuses and anterior ethmoid air cells bilaterally, left greater than right. There is minimal mucosal thickening within the frontal sinuses. The sphenoid sinuses are clear. Minimal fluid is present in the inferior mastoid air cells bilaterally. No obstructing nasopharyngeal lesion is present. IMPRESSION: 1. Question punctate subacute white matter infarct in the posterior left pons. 2. No other acute or focal abnormality to explain the patient's symptoms. No acute or focal cortical infarcts. 3. Mild anterior sinus disease. Electronically Signed   By: San Morelle M.D.   On: 03/21/2017 09:52   Nm Pulmonary Perf And Vent  Result Date: 03/05/2017 CLINICAL DATA:  Chest pain and shortness of breath associated with episode of hyperglycemia, history cirrhosis, stage III chronic kidney disease, permanent atrial fibrillation, type II diabetes mellitus EXAM: NUCLEAR MEDICINE VENTILATION - PERFUSION LUNG SCAN TECHNIQUE: Ventilation images were obtained in  multiple projections using inhaled aerosol Tc-45m DTPA. Perfusion images were obtained in multiple projections after intravenous injection of Tc-2m MAA. RADIOPHARMACEUTICALS:  31.0 mCi Technetium-73m DTPA aerosol inhalation and 4.2 mCi Technetium-13m MAA IV COMPARISON:  None Correlation: Chest radiograph 03/05/2017 FINDINGS: Ventilation: Enlargement of cardiac silhouette. Diminished ventilation LEFT lower lobe. Remaining ventilation normal. Perfusion: Matching diminished perfusion LEFT lower lobe. Enlargement of cardiac silhouette. Remaining perfusion normal. Chest radiograph: Persistent LEFT lower lobe atelectasis versus consolidation. IMPRESSION: Matching ventilation, perfusion and radiographic abnormalities of the LEFT lower lobe. By PIOPED II criteria, findings represent an intermediate probability for pulmonary embolism. Electronically Signed   By: Lavonia Dana M.D.   On: 03/05/2017 13:06   Dg Chest Port 1 View  Result Date: 03/01/2017 CLINICAL DATA:  76 year old male with aspiration. EXAM: PORTABLE CHEST 1 VIEW COMPARISON:  Chest radiograph dated 02/28/2017 FINDINGS: There stable cardiomegaly. No vascular congestion or edema. There is silhouetting of the left hemidiaphragm likely related to cardiomegaly. Underline left lung base atelectasis or infiltrate is not entirely excluded. Clinical correlation is  recommended with there is no pneumothorax. There is atherosclerotic calcification of the thoracic aorta. No acute osseous pathology. IMPRESSION: Stable cardiomegaly. No interval change in the appearance of the lungs. Electronically Signed   By: Anner Crete M.D.   On: 03/01/2017 21:54   Dg Chest Port 1 View  Result Date: 02/28/2017 CLINICAL DATA:  Cough for 1 day EXAM: PORTABLE CHEST 1 VIEW COMPARISON:  February 25, 2017 FINDINGS: There is mild atelectasis in the left base, stable. There is no edema or consolidation. There is cardiomegaly with pulmonary vascularity within normal limits. No adenopathy.  There is atherosclerotic calcification aorta. No bone lesions. IMPRESSION: Stable cardiomegaly. Aortic atherosclerosis. Atelectasis left base. No frank edema or consolidation. Electronically Signed   By: Lowella Grip III M.D.   On: 02/28/2017 08:58   Dg Chest Port 1 View  Result Date: 02/25/2017 CLINICAL DATA:  Found unresponsive this morning, mental status changes EXAM: PORTABLE CHEST 1 VIEW COMPARISON:  01/19/2017 FINDINGS: Cardiomegaly again noted. Central mild vascular congestion without convincing pulmonary edema. Elevation of the right hemidiaphragm again noted. Mild left basilar atelectasis. IMPRESSION: Central mild vascular congestion without convincing pulmonary edema. Elevation of the right hemidiaphragm again noted. Mild left basilar atelectasis. Electronically Signed   By: Lahoma Crocker M.D.   On: 02/25/2017 14:25      Subjective: Seen this morning. Patient was somnolent but arousable. Denied complaints. Denied pain or dyspnea. As per report, multiple BMs overnight and refusing to take medications today.  Discharge Exam:  Vitals:   03/26/17 0628 03/26/17 0900 03/26/17 1330 03/26/17 1350  BP: 99/68 120/70 100/70 99/72  Pulse: (!) 120 (!) 120 (!) 180 (!) 114  Resp: 18 (!) 22 (!) 22 18  Temp: 97.8 F (36.6 C) 97.1 F (36.2 C) 97 F (36.1 C) 97.8 F (36.6 C)  TempSrc:  Axillary Axillary Oral  SpO2: 92% 93% 92% 98%  Weight:      Height:        General: Pt lying comfortably in bed & appears in no obvious distress.Chronically ill-appearing elderly male. Cardiovascular: S1 & S2 heard, RRR, S1/S2 +. No murmurs, rubs, gallops or clicks. No JVD or pedal edema. Respiratory: Clear to auscultation without wheezing, rhonchi or crackles. No increased work of breathing. Abdominal:  Non distended, non tender & soft. No organomegaly or masses appreciated. Normal bowel sounds heard. CNS: Somnolent but easily arousable and oriented only to self. No focal neurological  deficits. Extremities: no edema, no cyanosis    The results of significant diagnostics from this hospitalization (including imaging, microbiology, ancillary and laboratory) are listed below for reference.     Microbiology: No results found for this or any previous visit (from the past 240 hour(s)).   Labs: CBC:  Recent Labs Lab 03/22/17 0511 03/23/17 0534 03/24/17 0514 03/25/17 0455 03/26/17 0522  WBC 8.1 8.7 9.9 10.4 11.4*  NEUTROABS 5.6 6.6 7.7 7.7 8.6*  HGB 11.2* 11.3* 11.4* 11.8* 12.0*  HCT 33.2* 33.3* 34.6* 36.4* 36.9*  MCV 100.0 100.3* 102.1* 102.5* 103.1*  PLT 181 208 218 216 130   Basic Metabolic Panel:  Recent Labs Lab 03/23/17 0534 03/24/17 0514 03/25/17 0455 03/25/17 1443 03/26/17 0522  NA 139 143 143 141 143  K 3.3* 3.5 2.7* 3.4* 4.0  CL 100* 105 101 99* 103  CO2 30 30 32 27 32  GLUCOSE 111* 110* 90 145* 99  BUN 33* 20 16 15 16   CREATININE 1.67* 1.34* 1.25* 1.40* 1.41*  CALCIUM 8.8* 8.8* 8.6* 8.6* 8.5*  Liver Function Tests:  Recent Labs Lab 03/20/17 2114 03/21/17 0444 03/24/17 0514 03/25/17 0455 03/26/17 0522  AST 27 22 30  34 29  ALT 17 16* 16* 18 17  ALKPHOS 224* 205* 182* 182* 204*  BILITOT 2.3* 2.1* 2.0* 2.3* 2.3*  PROT 7.5 6.7 7.3 7.5 7.6  ALBUMIN 2.9* 2.5* 2.6* 2.7* 2.6*   Cardiac Enzymes:  Recent Labs Lab 03/21/17 0444 03/21/17 1036 03/21/17 1647  TROPONINI 0.03* <0.03 0.03*   CBG:  Recent Labs Lab 03/25/17 1244 03/25/17 1728 03/25/17 2202 03/26/17 0824 03/26/17 1144  GLUCAP 116* 104* 97 85 96   Urinalysis    Component Value Date/Time   COLORURINE YELLOW 03/21/2017 0001   APPEARANCEUR CLEAR 03/21/2017 0001   APPEARANCEUR Clear 09/28/2013 0845   LABSPEC 1.010 03/21/2017 0001   PHURINE 5.0 03/21/2017 0001   GLUCOSEU NEGATIVE 03/21/2017 0001   HGBUR NEGATIVE 03/21/2017 0001   BILIRUBINUR NEGATIVE 03/21/2017 0001   BILIRUBINUR neg 12/07/2015 0939   BILIRUBINUR Negative 09/28/2013 0845   KETONESUR NEGATIVE  03/21/2017 0001   PROTEINUR NEGATIVE 03/21/2017 0001   UROBILINOGEN negative 12/07/2015 0939   UROBILINOGEN 0.2 02/19/2014 1612   NITRITE NEGATIVE 03/21/2017 0001   LEUKOCYTESUR NEGATIVE 03/21/2017 0001   LEUKOCYTESUR Negative 09/28/2013 0845    Discussed in detail with patient's spouse multiple times. Updated care and answered questions.  Time coordinating discharge: Over 30 minutes  SIGNED:  Vernell Leep, MD, FACP, [place]. [facility] Pager [phone]  If 7PM-7AM, please contact night-coverage [email or web address] Password [facility] 03/26/2017, 4:08 PM

## 2017-03-26 NOTE — Progress Notes (Signed)
Advanced Heart Failure Rounding Note  PCP: Claretta Fraise, MD Primary Cardiologist: Dr. Haroldine Laws   Subjective:    Feeling better this morning. Refused a dose of lactulose yesterday as he soiled the bed 4 times before lunch. Denies SOB or CP. No further confusion per family.   5W no longer tracking weights or output despite orders.  Creatinine 1.41 from 1.25 yesterday.     MRA 03/22/17 with no intracranial significance. 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   MRI 03/21/17 with ? Punctate subacute white matter infarct in the posterior left pons. + mild anterior sinus disease.   EEG 03/21/17, completed. No interpretation available in system.  Echo 03/21/17 with EF 50-55%, RV volume overloaded, + moderate, partially loculated pericardial effusion without tamponade.   Objective:   Weight Range: 212 lb 1.3 oz (96.2 kg) Body mass index is 27.98 kg/m.   Vital Signs:   Temp:  [97.5 F (36.4 C)-98.8 F (37.1 C)] 97.8 F (36.6 C) (04/18 0628) Pulse Rate:  [115-123] 120 (04/18 0628) Resp:  [18-19] 18 (04/18 0628) BP: (99-115)/(68-75) 99/68 (04/18 0628) SpO2:  [91 %-93 %] 92 % (04/18 0628) Last BM Date: 03/25/17  Weight change: Filed Weights   03/22/17 0204 03/23/17 0414 03/24/17 0300  Weight: 227 lb 8.2 oz (103.2 kg) 216 lb 0.8 oz (98 kg) 212 lb 1.3 oz (96.2 kg)    Intake/Output:   Intake/Output Summary (Last 24 hours) at 03/26/17 0719 Last data filed at 03/25/17 2317  Gross per 24 hour  Intake                0 ml  Output             1450 ml  Net            -1450 ml     Physical Exam: General:  Elderly and fatigued appearing. NAD.    HEENT: Normal Neck: Supple. JVP 8-9 cm reclined in bed. Carotids 2+ bilat; no bruits. No thyromegaly or nodule noted.  Cor: PMI non-displaced. Irr Irr and somewhat tachy. 2/6 TR and + RV lift. + friction rub though improved.   Lungs: Diminished basilar sound.   Abdomen:  Soft, NT, ND, no HSM. No bruits or masses. +BS  Extremities: No cyanosis or clubbing. + chronic venous stasis changes. Trace ankle edema. + resting tremor.    Neuro: Alert & oriented x 3. Cranial nerves grossly intact. Moves all 4 extremities w/o difficulty. Affect pleasant    Telemetry: Not connected.  Labs: CBC  Recent Labs  03/25/17 0455 03/26/17 0522  WBC 10.4 11.4*  NEUTROABS 7.7 8.6*  HGB 11.8* 12.0*  HCT 36.4* 36.9*  MCV 102.5* 103.1*  PLT 216 097   Basic Metabolic Panel  Recent Labs  03/25/17 1443 03/26/17 0522  NA 141 143  K 3.4* 4.0  CL 99* 103  CO2 27 32  GLUCOSE 145* 99  BUN 15 16  CREATININE 1.40* 1.41*  CALCIUM 8.6* 8.5*   Liver Function Tests  Recent Labs  03/25/17 0455 03/26/17 0522  AST 34 29  ALT 18 17  ALKPHOS 182* 204*  BILITOT 2.3* 2.3*  PROT 7.5 7.6  ALBUMIN 2.7* 2.6*   No results for input(s): LIPASE, AMYLASE in the last 72 hours. Cardiac Enzymes No results for input(s): CKTOTAL, CKMB, CKMBINDEX, TROPONINI in the last 72 hours.  BNP: BNP (last 3 results) No results for input(s): BNP in the  last 8760 hours.  ProBNP (last 3 results) No results for input(s): PROBNP in the last 8760 hours.   D-Dimer No results for input(s): DDIMER in the last 72 hours. Hemoglobin A1C No results for input(s): HGBA1C in the last 72 hours. Fasting Lipid Panel No results for input(s): CHOL, HDL, LDLCALC, TRIG, CHOLHDL, LDLDIRECT in the last 72 hours. Thyroid Function Tests No results for input(s): TSH, T4TOTAL, T3FREE, THYROIDAB in the last 72 hours.  Invalid input(s): FREET3  Other results:     Imaging/Studies:  No results found.    Medications:     Scheduled Medications: . allopurinol  300 mg Oral Daily  . amiodarone  200 mg Oral BID  . calcitRIOL  0.5 mcg Oral Daily  . colchicine  0.6 mg Oral BID  . febuxostat  40 mg Oral Daily  . geriatric multivitamins-minerals  15 mL Oral Daily  . insulin aspart  0-9 Units Subcutaneous  TID WC  . lactulose  30 g Oral TID  . OLANZapine  5 mg Intramuscular QHS  . pantoprazole  40 mg Oral BID AC  . thiamine  100 mg Oral Daily  . torsemide  20 mg Oral BID  . Warfarin - Pharmacist Dosing Inpatient   Does not apply q1800    Infusions: . lacosamide (VIMPAT) IV      PRN Medications: acetaminophen **OR** acetaminophen, haloperidol **OR** haloperidol lactate   Assessment/Plan   Melvin Wang is a 76 y.o. male with PMH of Diastolic CHF with end-stage RV failure, Chronic afib, CKD stage III, DM2, resting tremor, and cirrhosis who presented to [facility] after episode of confusion. Incidental pericardial effusion noted on CT ABD/Pelvis  1. AMS with seizure like activity - Encephalopathy much improved. Ammonia improved with taking lactlose, though now overwhelmed by BMs.      - MRI 03/21/17 with ? Punctate subacute white matter infarct in the posterior pons. No other acute abnormality.  - EEG completed and report pending. Neuro following.     - LFTs WNL.   - Baseline cognitive impairment also noted.  2. Acute pericarditis with effusion - Unclear cause. Sinus disease noted on MRI head. ? Viral cause with reported URI.  - Effusion incidentally on CT ABD/pel.  At its widest point is 18 mm.Echo without tamponade.  - Continue colchicine.  - Will need repeat echo. Will discuss timing with MD to re-evaluate effusion.  3. 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 Will stop IV lasix and transition back to torsemide now that he is taking po.  - Careful not to over-diurese with RV failure and pre-load dependence. 4. Chronic Afib - Continue amiodarone 200 mg BID for rate control. Would not increase amio with liver failure. Rate remains somewhat elevated from baseline.    - Rate up in acute setting.  - INR 2.33. Continue coumadin. Dosing per pharm.  - Propranolol held at admission. Pressures remain soft but would like to eventually add back for rate control.  Will discuss with MD. He may tolerate 5 mg BID? 5. CKD stage III - Slightly up from yesterday. Continue to follow closely.   6. Cirrhosis with hepatic encephalopathy  - Ammonia 16 this am now back on lactulose.  - Pt states he had 8-10 BMs yesterday. Will titrate dose down to 20 g TID to try and balance down to 3-4 BMs daily. Will need to uptitrate if ammonia starts to climb. Pt aware.  7. ID  - WBC slowly trending up.  Afebrile.  -  UA negative. Continue to follow.    Prognosis guarded overall. Again stressed importance of medication compliance.  Pt remains at high risk for re-admission with extensive co-morbidities and end stage RV failure.    Length of Stay: [place], [place]  03/26/2017, 7:19 AM  Advanced Heart Failure Team Pager [phone] (M-F; 7a - 4p)  Please contact [facility] for night-coverage after hours (4p -7a ) and weekends on [email or web address]  Patient seen and examined with the above-signed Advanced Practice Provider and/or Housestaff. I personally reviewed laboratory data, imaging studies and relevant notes. I independently examined the patient and formulated the important aspects of the plan. I have edited the note to reflect any of my changes or salient points. I have personally discussed the plan with the patient and/or family.  He a rough night last night with more sundowning and agitation. This am more calm but still confused at times. Volume status better. AF rate is controlled with amio.   Long talk with his wife and daughter. They are adamant that they want to take him home today with [facility]. Given end-stage RHF, cirrhosis and delirium, I think this is very appropriate and Mr. Senkbeil agrees. We discussed code status and he is now DNR/DNI. I called [facility] directly and discussed with their intake team and they will contact family and set him up at home. I also spoke with Dr. Lavella Lemons who agrees. We contacted case management  who is also helping to coordinate.   From HF perspective, home meds will be  Amio 200 bid Torsemide 20 bid. Take extra 20mg  for increased swelling.  Lopressor 25 bid   Total time spent 45 minutes. Over half that time spent discussing above.   Glori Bickers, MD  2:58 PM

## 2017-03-26 NOTE — Progress Notes (Signed)
ANTICOAGULATION CONSULT NOTE -Follow up  Pharmacy Consult for Coumadin  Indication: atrial fibrillation   Patient Measurements: Height: 6\' 1"  (185.4 cm) Weight: 212 lb 1.3 oz (96.2 kg) IBW/kg (Calculated) : 79.9  Vital Signs: Temp: 97.8 F (36.6 C) (04/18 0628) BP: 99/68 (04/18 0628) Pulse Rate: 120 (04/18 0628)  Labs:  Recent Labs  03/24/17 0514 03/25/17 0455 03/25/17 1443 03/26/17 0522  HGB 11.4* 11.8*  --  12.0*  HCT 34.6* 36.4*  --  36.9*  PLT 218 216  --  194  LABPROT 36.5* 29.4*  --  26.0*  INR 3.57 2.72  --  2.33  CREATININE 1.34* 1.25* 1.40* 1.41*    Estimated Creatinine Clearance: 57 mL/min (A) (by C-G formula based on SCr of 1.41 mg/dL (H)).  Assessment: 76 y/o M on Coumadin PTA for history of afib, presented to [facility] on 03/20/17  with altered mental status.  PTA Coumadin dose: 2.5mg  daily, last taken pta on 4/11- INR 4.3 at outpatient anti-coag visit on 4/12.  Coumadin has been held here for elevated INRs, restarted 4/17.  INR has trended down to 2.33 today.  Hgb 11.8, plts 216- no bleeding noted.  Goal of Therapy:  INR 2-3 Monitor platelets by anticoagulation protocol: Yes   Plan:  Repeat Coumadin 2.5mg  PO x 1 tonight Daily INR and CBC  Brody Bonneau, Pharm.D., BCPS Clinical Pharmacist Pager [phone] 03/26/2017 11:04 AM

## 2017-03-26 NOTE — Progress Notes (Addendum)
Pt code status changed to DNR. Gold DNR paper signed by Dr. Glori Bickers.

## 2017-03-26 NOTE — Progress Notes (Signed)
Faxed Hospice Referral to [facility] Jonnie Finner RN CCM Case Mgmt phone [phone]

## 2017-03-26 NOTE — Progress Notes (Signed)
Faxed dc summary to [facility]. Jonnie Finner RN CCM Case Mgmt phone [phone]

## 2017-03-26 NOTE — Progress Notes (Signed)
Attempted to give pt his 8:00 meds this am but pt is not very cooperative. Crushed and put in AS but pt spit it back out is was cursing. Managed to get very little of the meds in him. Wife and daughter both feel that pt is ready to go home to die. Daughter has spoken with someone at [facility] and wishes to take patient home with [facility]. Spoke with Dr. Algis Liming and he will review chart and make changes accordingly.

## 2017-03-27 ENCOUNTER — Telehealth: Payer: Self-pay | Admitting: *Deleted

## 2017-03-27 NOTE — Telephone Encounter (Signed)
Fax written order to [facility] at [phone] Write Hospice patient for delivery Call Beth at [phone] to let her know this has been done  Ativan 2 mg / ml      .25 mls every 3 hours as needed Morphine 20 mg / ml       .25 mls every 2 hours as needed  Patient received into hospice last night with prognosis of 2 wks or less

## 2017-03-27 NOTE — Telephone Encounter (Signed)
Beth aware Rx faxed to [facility]

## 2017-03-28 ENCOUNTER — Encounter (HOSPITAL_COMMUNITY): Payer: Medicare Other

## 2017-03-28 IMAGING — CR DG CHEST 1V PORT
1 series · 1 of 1 positions shown · non-contrast
Comparison: 01/19/2017

CLINICAL DATA: Found unresponsive this morning, mental status
changes

EXAM:
PORTABLE CHEST 1 VIEW

[portable]
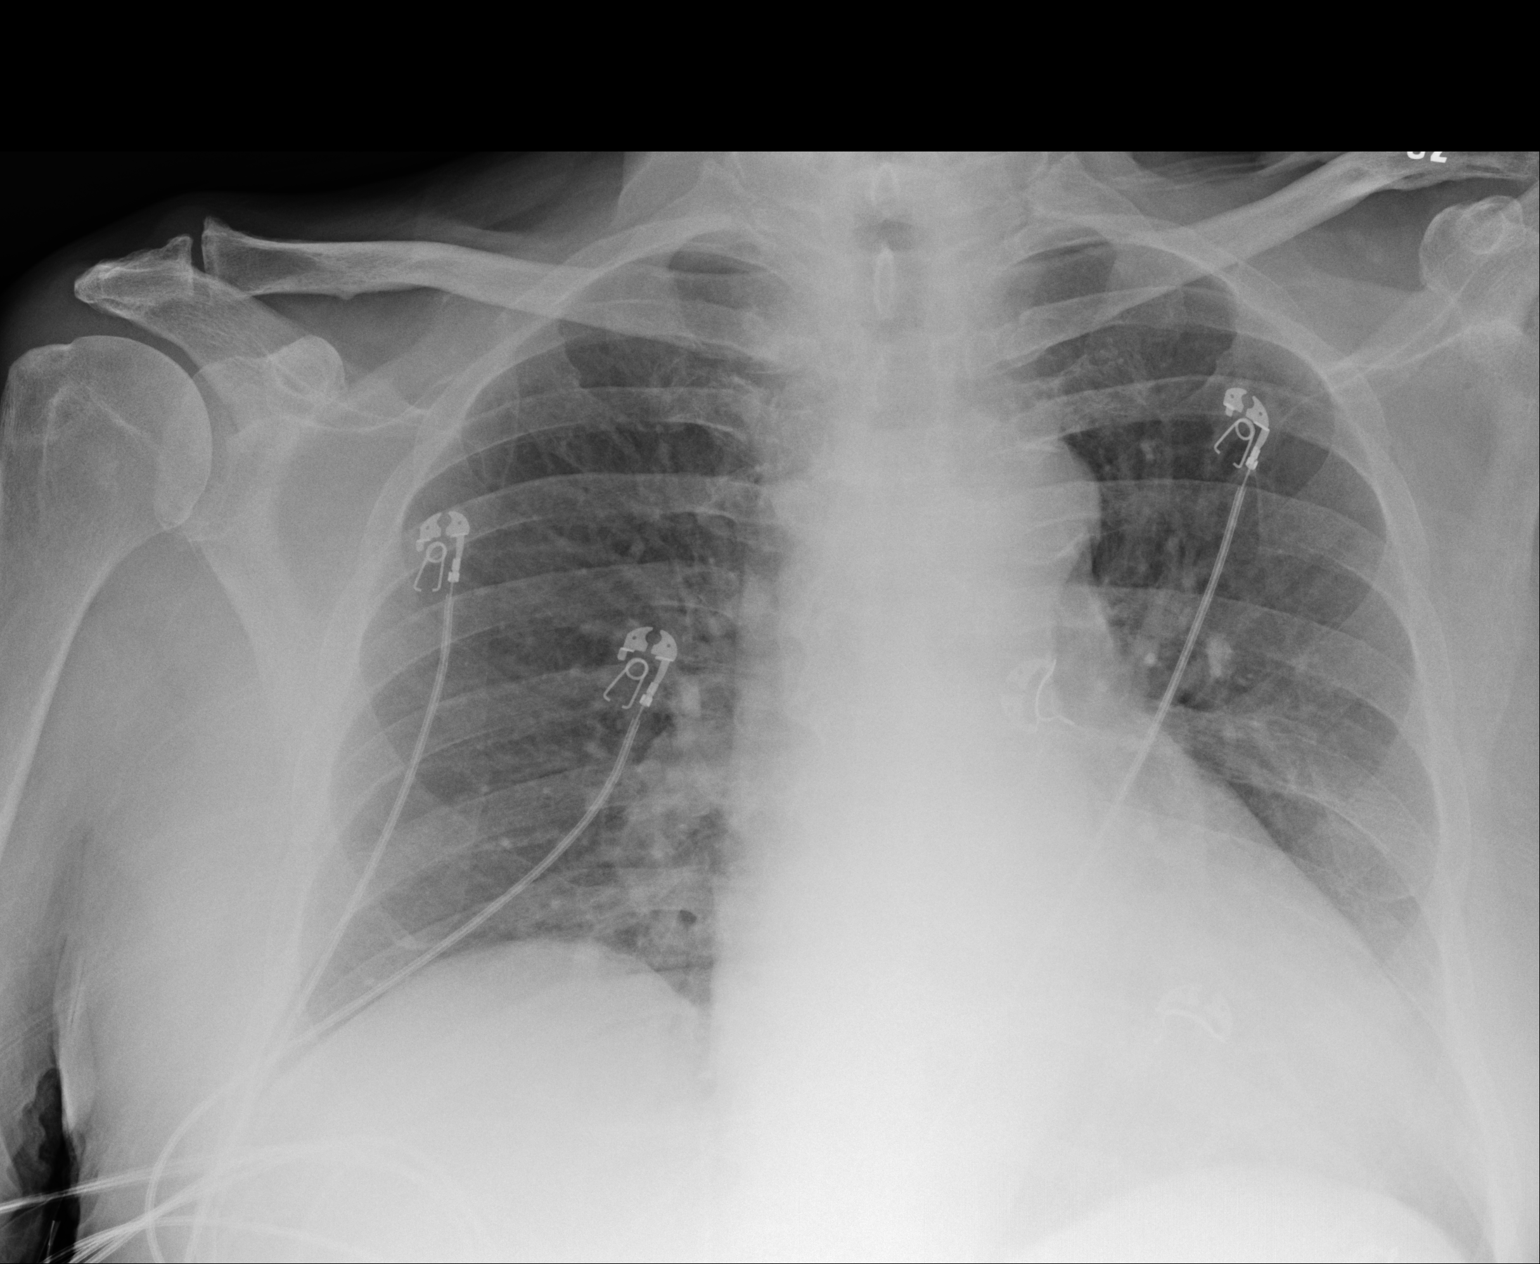

[1 of 1 positions shown; findings below may reference images not displayed]

FINDINGS: Cardiomegaly again noted. Central mild vascular congestion without
convincing pulmonary edema. Elevation of the right hemidiaphragm
again noted. Mild left basilar atelectasis.
IMPRESSION: Central mild vascular congestion without convincing pulmonary edema.
Elevation of the right hemidiaphragm again noted. Mild left basilar
atelectasis.

## 2017-03-31 IMAGING — CR DG CHEST 1V PORT
1 series · 1 of 1 positions shown · non-contrast
Comparison: February 25, 2017

CLINICAL DATA: Cough for 1 day

EXAM:
PORTABLE CHEST 1 VIEW

[portable]
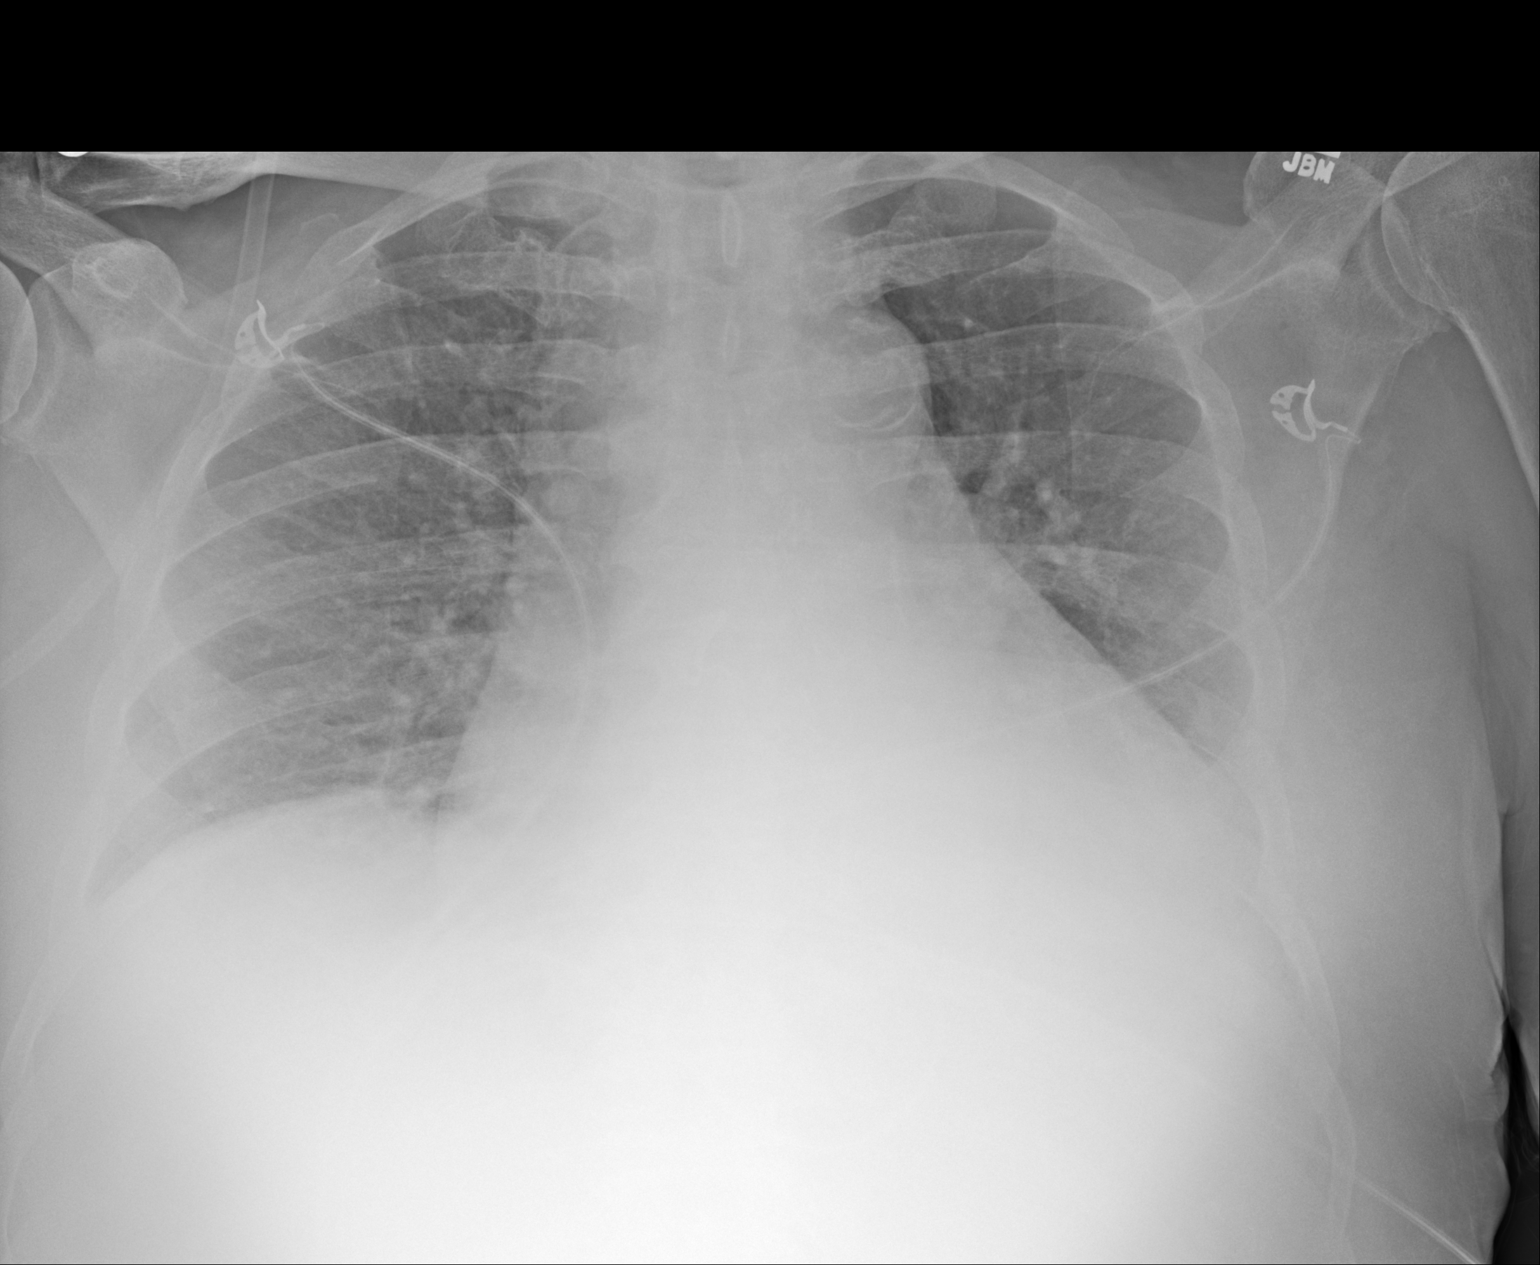

[1 of 1 positions shown; findings below may reference images not displayed]

FINDINGS: There is mild atelectasis in the left base, stable. There is no
edema or consolidation. There is cardiomegaly with pulmonary
vascularity within normal limits. No adenopathy. There is
atherosclerotic calcification aorta. No bone lesions.
IMPRESSION: Stable cardiomegaly. Aortic atherosclerosis. Atelectasis left base.
No frank edema or consolidation.

## 2017-04-01 IMAGING — CR DG CHEST 1V PORT
1 series · 1 of 1 positions shown · non-contrast
Comparison: Chest radiograph dated 02/28/2017

CLINICAL DATA: 75-year-old male with aspiration.

EXAM:
PORTABLE CHEST 1 VIEW

[AP]
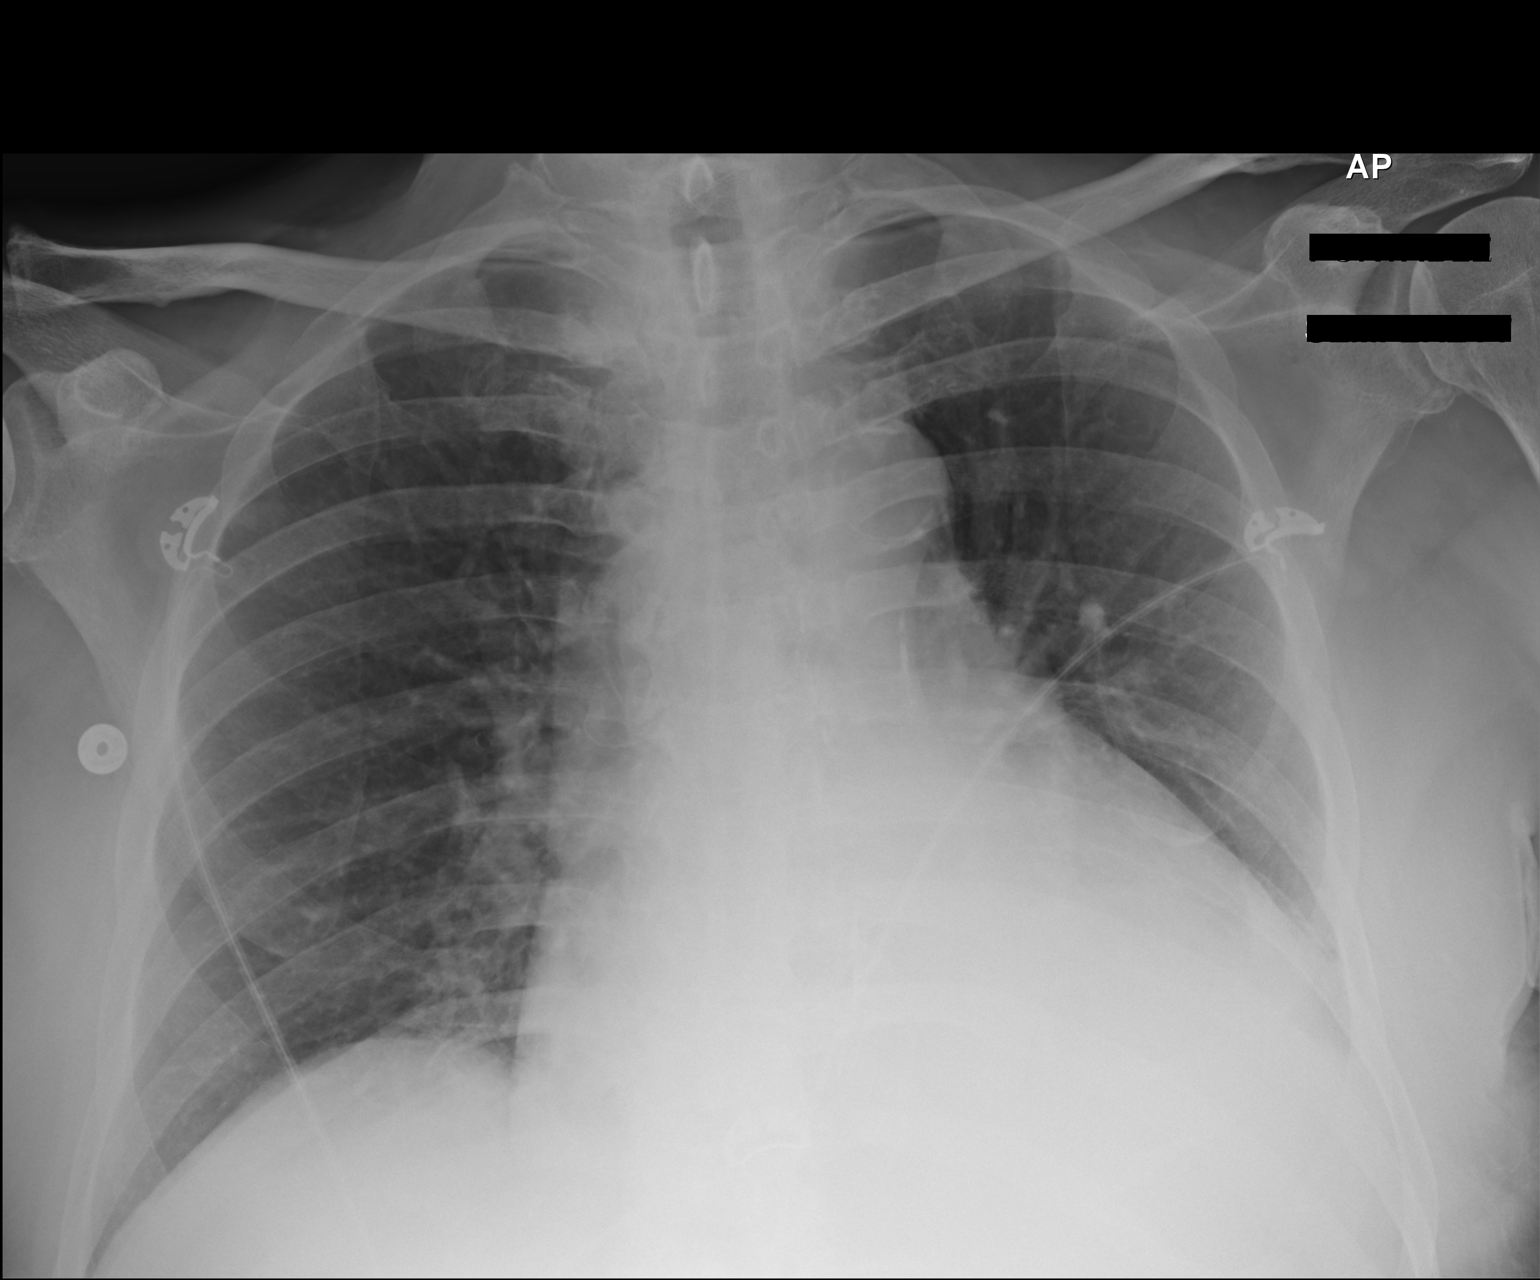

[1 of 1 positions shown; findings below may reference images not displayed]

FINDINGS: There stable cardiomegaly. No vascular congestion or edema. There is
silhouetting of the left hemidiaphragm likely related to
cardiomegaly. Underline left lung base atelectasis or infiltrate is
not entirely excluded. Clinical correlation is recommended with
there is no pneumothorax. There is atherosclerotic calcification of
the thoracic aorta. No acute osseous pathology.
IMPRESSION: Stable cardiomegaly. No interval change in the appearance of the
lungs.

## 2017-04-03 NOTE — Progress Notes (Signed)
REVIEWED-NO ADDITIONAL RECOMMENDATIONS. Pt deceased.

## 2017-04-04 NOTE — Progress Notes (Signed)
CC'D TO PCP °

## 2017-04-05 IMAGING — CR DG CHEST 2V
2 series · 2 of 2 positions shown · non-contrast
Comparison: 03/01/2017.

CLINICAL DATA: Shortness of breath.

EXAM:
CHEST  2 VIEW

[chest lat]
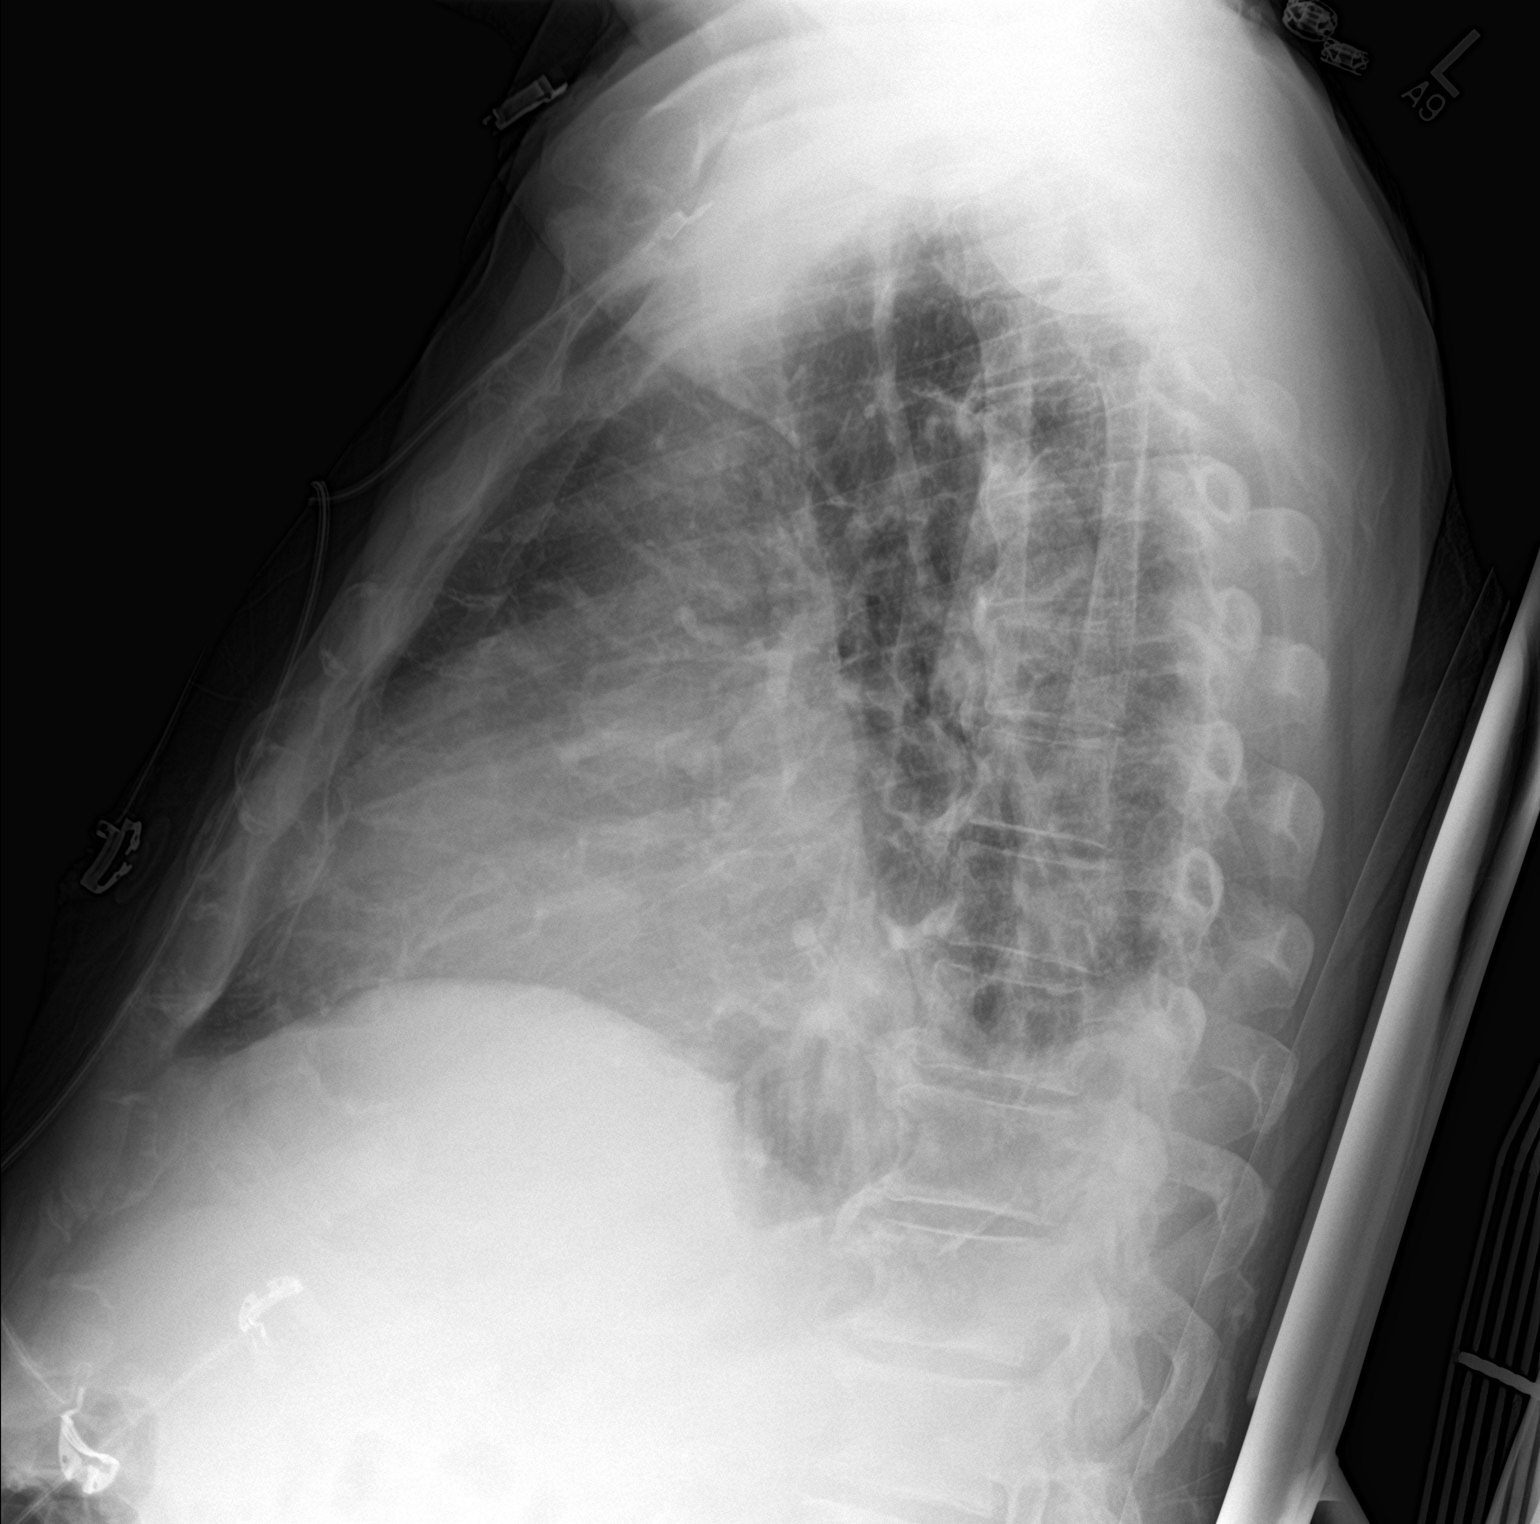

[chest ap]
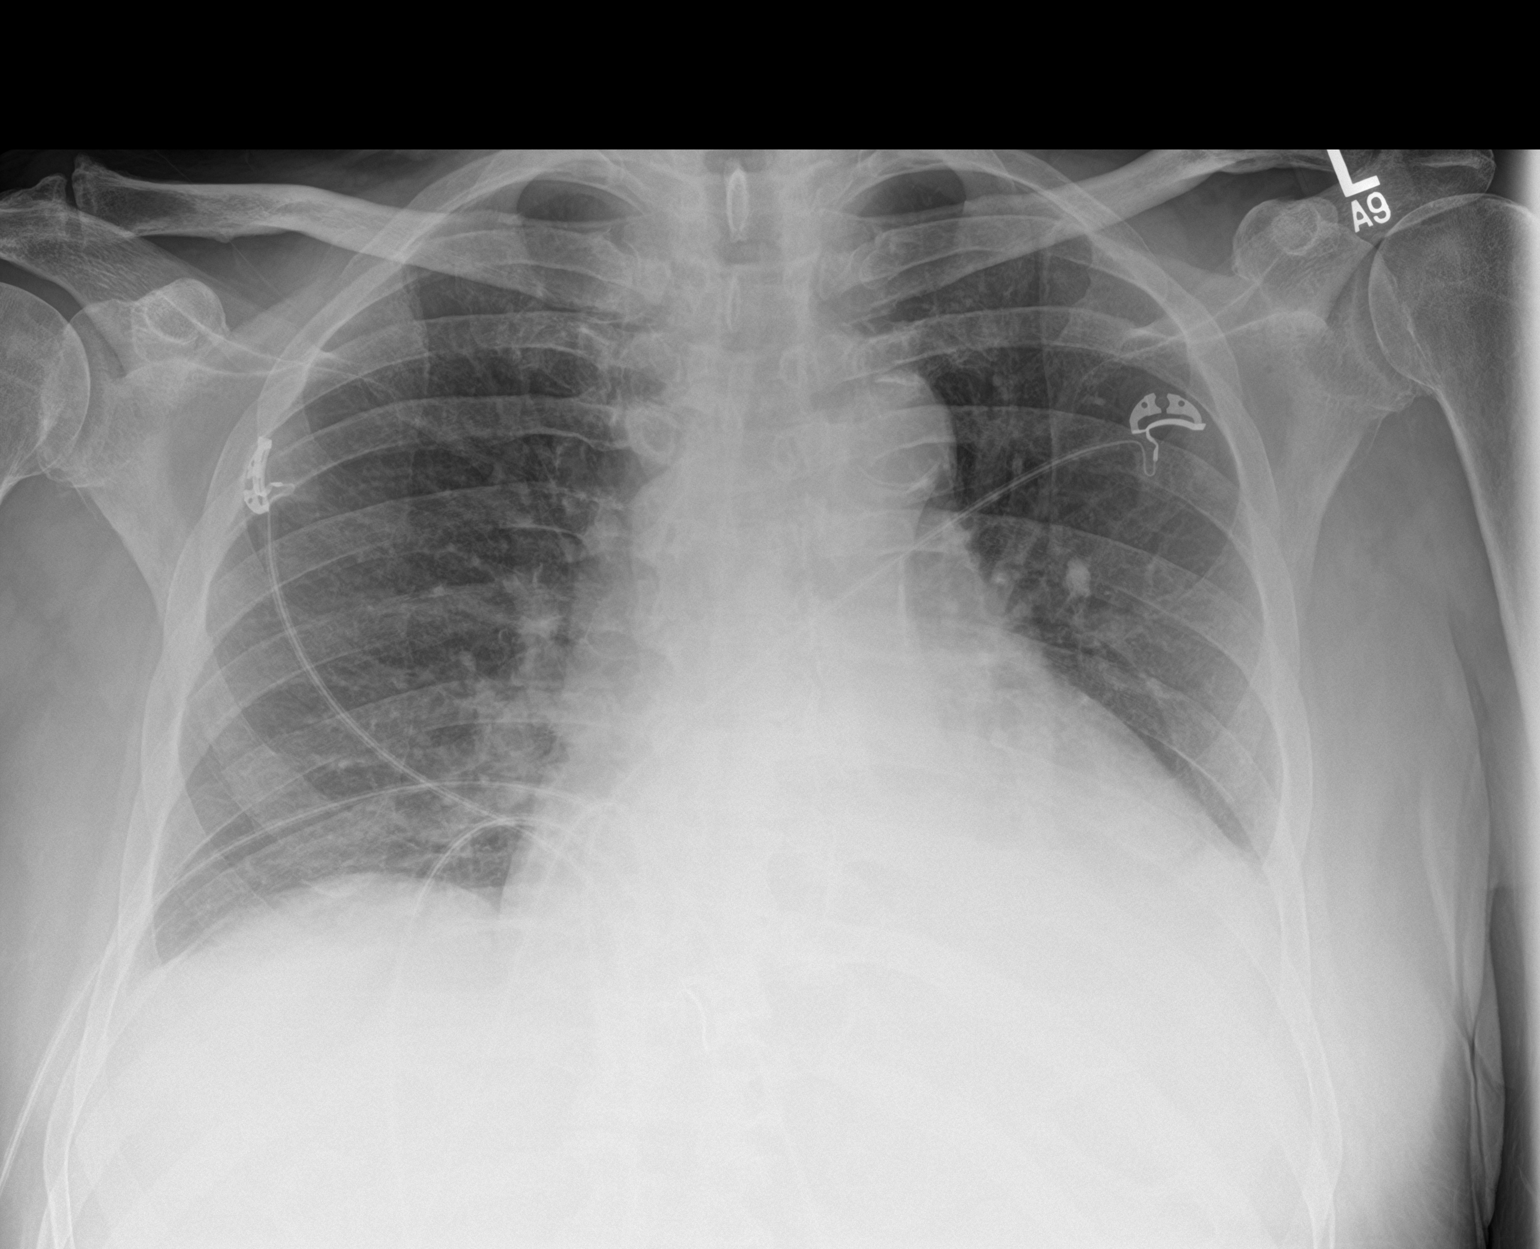

[2 of 2 positions shown; findings below may reference images not displayed]

FINDINGS: Stable cardiomegaly with normal pulmonary vascularity. Stable left
base pleuroparenchymal thickening consistent with scarring.
Bilateral pleural effusions. No pneumothorax. No acute bony
abnormality.
IMPRESSION: 1. Stable left base atelectatic changes and/or pleural-parenchymal
scarring.

2.  Stable cardiomegaly.  Bilateral pleural effusions.

## 2017-04-08 DEATH — deceased
